# Patient Record
Sex: Female | Born: 1940 | Race: Black or African American | Hispanic: No | Smoking: Former smoker
Health system: Southern US, Community
[De-identification: ages and names within clinical notes are randomized; demographics above are authoritative.]

## PROBLEM LIST (undated history)

## (undated) DIAGNOSIS — Z8679 Personal history of other diseases of the circulatory system: Secondary | ICD-10-CM

## (undated) DIAGNOSIS — K7581 Nonalcoholic steatohepatitis (NASH): Secondary | ICD-10-CM

## (undated) DIAGNOSIS — F329 Major depressive disorder, single episode, unspecified: Secondary | ICD-10-CM

## (undated) DIAGNOSIS — M6281 Muscle weakness (generalized): Secondary | ICD-10-CM

## (undated) DIAGNOSIS — M3320 Polymyositis, organ involvement unspecified: Secondary | ICD-10-CM

## (undated) DIAGNOSIS — I10 Essential (primary) hypertension: Secondary | ICD-10-CM

## (undated) DIAGNOSIS — IMO0002 Reserved for concepts with insufficient information to code with codable children: Secondary | ICD-10-CM

## (undated) DIAGNOSIS — Z8719 Personal history of other diseases of the digestive system: Secondary | ICD-10-CM

## (undated) DIAGNOSIS — J302 Other seasonal allergic rhinitis: Secondary | ICD-10-CM

## (undated) DIAGNOSIS — Z9889 Other specified postprocedural states: Secondary | ICD-10-CM

## (undated) DIAGNOSIS — I872 Venous insufficiency (chronic) (peripheral): Secondary | ICD-10-CM

## (undated) DIAGNOSIS — R1314 Dysphagia, pharyngoesophageal phase: Secondary | ICD-10-CM

## (undated) DIAGNOSIS — F32A Depression, unspecified: Secondary | ICD-10-CM

## (undated) DIAGNOSIS — M549 Dorsalgia, unspecified: Secondary | ICD-10-CM

## (undated) DIAGNOSIS — K219 Gastro-esophageal reflux disease without esophagitis: Secondary | ICD-10-CM

## (undated) DIAGNOSIS — G629 Polyneuropathy, unspecified: Secondary | ICD-10-CM

## (undated) DIAGNOSIS — G43909 Migraine, unspecified, not intractable, without status migrainosus: Secondary | ICD-10-CM

## (undated) HISTORY — DX: Depression, unspecified: F32.A

## (undated) HISTORY — DX: Muscle weakness (generalized): M62.81

## (undated) HISTORY — DX: Major depressive disorder, single episode, unspecified: F32.9

## (undated) HISTORY — DX: Venous insufficiency (chronic) (peripheral): I87.2

## (undated) HISTORY — DX: Dorsalgia, unspecified: M54.9

## (undated) HISTORY — DX: Dysphagia, pharyngoesophageal phase: R13.14

## (undated) HISTORY — DX: Personal history of other diseases of the circulatory system: Z86.79

## (undated) HISTORY — PX: ESOPHAGOGASTRODUODENOSCOPY: SHX1529

## (undated) HISTORY — PX: KNEE ARTHROSCOPY: SUR90

## (undated) HISTORY — PX: CHOLECYSTECTOMY: SHX55

## (undated) HISTORY — DX: Migraine, unspecified, not intractable, without status migrainosus: G43.909

## (undated) HISTORY — DX: Polymyositis, organ involvement unspecified: M33.20

## (undated) HISTORY — DX: Personal history of other diseases of the digestive system: Z87.19

## (undated) HISTORY — DX: Other specified postprocedural states: Z98.890

## (undated) HISTORY — PX: BLADDER REPAIR: SHX76

## (undated) HISTORY — DX: Reserved for concepts with insufficient information to code with codable children: IMO0002

## (undated) HISTORY — PX: TUBAL LIGATION: SHX77

## (undated) HISTORY — DX: Gastro-esophageal reflux disease without esophagitis: K21.9

## (undated) HISTORY — DX: Essential (primary) hypertension: I10

## (undated) HISTORY — DX: Polyneuropathy, unspecified: G62.9

## (undated) HISTORY — DX: Other seasonal allergic rhinitis: J30.2

## (undated) HISTORY — PX: EYE SURGERY: SHX253

## (undated) HISTORY — DX: Nonalcoholic steatohepatitis (NASH): K75.81

---

## 1978-08-07 HISTORY — PX: ABDOMINAL HYSTERECTOMY: SHX81

## 1997-11-12 ENCOUNTER — Encounter: Admission: RE | Admit: 1997-11-12 | Discharge: 1997-11-12 | Payer: Self-pay | Admitting: Sports Medicine

## 1997-11-27 ENCOUNTER — Encounter: Admission: RE | Admit: 1997-11-27 | Discharge: 1997-11-27 | Payer: Self-pay | Admitting: Family Medicine

## 1997-12-09 ENCOUNTER — Encounter: Admission: RE | Admit: 1997-12-09 | Discharge: 1997-12-09 | Payer: Self-pay | Admitting: Family Medicine

## 1997-12-11 ENCOUNTER — Encounter: Admission: RE | Admit: 1997-12-11 | Discharge: 1997-12-11 | Payer: Self-pay | Admitting: Family Medicine

## 1998-01-04 ENCOUNTER — Encounter: Admission: RE | Admit: 1998-01-04 | Discharge: 1998-01-04 | Payer: Self-pay | Admitting: Family Medicine

## 1998-02-02 ENCOUNTER — Encounter: Admission: RE | Admit: 1998-02-02 | Discharge: 1998-02-02 | Payer: Self-pay | Admitting: Family Medicine

## 1998-02-09 ENCOUNTER — Encounter: Admission: RE | Admit: 1998-02-09 | Discharge: 1998-02-09 | Payer: Self-pay | Admitting: Family Medicine

## 1998-03-02 ENCOUNTER — Encounter: Admission: RE | Admit: 1998-03-02 | Discharge: 1998-03-02 | Payer: Self-pay | Admitting: Sports Medicine

## 1998-03-11 ENCOUNTER — Encounter: Admission: RE | Admit: 1998-03-11 | Discharge: 1998-03-11 | Payer: Self-pay | Admitting: Family Medicine

## 1998-03-16 ENCOUNTER — Encounter: Admission: RE | Admit: 1998-03-16 | Discharge: 1998-03-16 | Payer: Self-pay | Admitting: Family Medicine

## 1998-05-03 ENCOUNTER — Encounter: Admission: RE | Admit: 1998-05-03 | Discharge: 1998-05-03 | Payer: Self-pay | Admitting: Family Medicine

## 1998-05-04 ENCOUNTER — Encounter: Admission: RE | Admit: 1998-05-04 | Discharge: 1998-05-04 | Payer: Self-pay | Admitting: Sports Medicine

## 1998-05-10 ENCOUNTER — Encounter: Admission: RE | Admit: 1998-05-10 | Discharge: 1998-05-10 | Payer: Self-pay | Admitting: Family Medicine

## 1998-06-02 ENCOUNTER — Encounter: Admission: RE | Admit: 1998-06-02 | Discharge: 1998-06-02 | Payer: Self-pay | Admitting: Family Medicine

## 1998-06-24 ENCOUNTER — Encounter: Admission: RE | Admit: 1998-06-24 | Discharge: 1998-06-24 | Payer: Self-pay | Admitting: Family Medicine

## 1998-09-13 ENCOUNTER — Emergency Department (HOSPITAL_COMMUNITY): Admission: EM | Admit: 1998-09-13 | Discharge: 1998-09-13 | Payer: Self-pay | Admitting: Emergency Medicine

## 1998-09-22 ENCOUNTER — Encounter: Admission: RE | Admit: 1998-09-22 | Discharge: 1998-09-22 | Payer: Self-pay | Admitting: Family Medicine

## 1998-10-06 ENCOUNTER — Encounter: Admission: RE | Admit: 1998-10-06 | Discharge: 1998-10-06 | Payer: Self-pay | Admitting: Family Medicine

## 1998-11-09 ENCOUNTER — Encounter: Admission: RE | Admit: 1998-11-09 | Discharge: 1998-11-09 | Payer: Self-pay | Admitting: Family Medicine

## 1998-11-25 ENCOUNTER — Encounter: Admission: RE | Admit: 1998-11-25 | Discharge: 1998-11-25 | Payer: Self-pay | Admitting: Family Medicine

## 1999-01-12 ENCOUNTER — Encounter: Admission: RE | Admit: 1999-01-12 | Discharge: 1999-01-12 | Payer: Self-pay | Admitting: Family Medicine

## 1999-01-26 ENCOUNTER — Encounter: Admission: RE | Admit: 1999-01-26 | Discharge: 1999-01-26 | Payer: Self-pay | Admitting: Family Medicine

## 1999-02-01 ENCOUNTER — Encounter: Admission: RE | Admit: 1999-02-01 | Discharge: 1999-02-01 | Payer: Self-pay | Admitting: Sports Medicine

## 1999-02-11 ENCOUNTER — Emergency Department (HOSPITAL_COMMUNITY): Admission: EM | Admit: 1999-02-11 | Discharge: 1999-02-11 | Payer: Self-pay | Admitting: Emergency Medicine

## 1999-02-14 ENCOUNTER — Emergency Department (HOSPITAL_COMMUNITY): Admission: EM | Admit: 1999-02-14 | Discharge: 1999-02-14 | Payer: Self-pay | Admitting: *Deleted

## 1999-02-18 ENCOUNTER — Ambulatory Visit (HOSPITAL_COMMUNITY): Admission: RE | Admit: 1999-02-18 | Discharge: 1999-02-18 | Payer: Self-pay | Admitting: *Deleted

## 1999-02-24 ENCOUNTER — Encounter: Payer: Self-pay | Admitting: Orthopedic Surgery

## 1999-02-24 ENCOUNTER — Ambulatory Visit (HOSPITAL_COMMUNITY): Admission: RE | Admit: 1999-02-24 | Discharge: 1999-02-24 | Payer: Self-pay | Admitting: Orthopedic Surgery

## 1999-03-15 ENCOUNTER — Encounter: Admission: RE | Admit: 1999-03-15 | Discharge: 1999-03-15 | Payer: Self-pay | Admitting: Family Medicine

## 1999-04-14 ENCOUNTER — Encounter: Admission: RE | Admit: 1999-04-14 | Discharge: 1999-04-14 | Payer: Self-pay | Admitting: Family Medicine

## 1999-04-15 ENCOUNTER — Encounter: Admission: RE | Admit: 1999-04-15 | Discharge: 1999-05-04 | Payer: Self-pay | Admitting: Orthopedic Surgery

## 1999-04-25 ENCOUNTER — Emergency Department (HOSPITAL_COMMUNITY): Admission: EM | Admit: 1999-04-25 | Discharge: 1999-04-25 | Payer: Self-pay | Admitting: Emergency Medicine

## 1999-05-09 ENCOUNTER — Encounter: Admission: RE | Admit: 1999-05-09 | Discharge: 1999-07-11 | Payer: Self-pay | Admitting: Orthopedic Surgery

## 1999-06-09 ENCOUNTER — Encounter: Admission: RE | Admit: 1999-06-09 | Discharge: 1999-06-09 | Payer: Self-pay | Admitting: Family Medicine

## 1999-06-21 ENCOUNTER — Encounter: Admission: RE | Admit: 1999-06-21 | Discharge: 1999-06-21 | Payer: Self-pay | Admitting: Family Medicine

## 1999-07-19 ENCOUNTER — Encounter: Payer: Self-pay | Admitting: Sports Medicine

## 1999-07-19 ENCOUNTER — Encounter: Admission: RE | Admit: 1999-07-19 | Discharge: 1999-07-19 | Payer: Self-pay | Admitting: Sports Medicine

## 1999-08-12 ENCOUNTER — Encounter: Admission: RE | Admit: 1999-08-12 | Discharge: 1999-08-12 | Payer: Self-pay | Admitting: Family Medicine

## 1999-08-19 ENCOUNTER — Encounter: Admission: RE | Admit: 1999-08-19 | Discharge: 1999-08-19 | Payer: Self-pay | Admitting: Family Medicine

## 1999-09-16 ENCOUNTER — Encounter: Admission: RE | Admit: 1999-09-16 | Discharge: 1999-09-16 | Payer: Self-pay | Admitting: Family Medicine

## 1999-10-22 ENCOUNTER — Emergency Department (HOSPITAL_COMMUNITY): Admission: EM | Admit: 1999-10-22 | Discharge: 1999-10-22 | Payer: Self-pay | Admitting: Emergency Medicine

## 2000-08-24 ENCOUNTER — Encounter: Payer: Self-pay | Admitting: Emergency Medicine

## 2000-08-24 ENCOUNTER — Encounter: Admission: RE | Admit: 2000-08-24 | Discharge: 2000-08-24 | Payer: Self-pay | Admitting: Emergency Medicine

## 2000-10-01 ENCOUNTER — Ambulatory Visit (HOSPITAL_COMMUNITY): Admission: RE | Admit: 2000-10-01 | Discharge: 2000-10-01 | Payer: Self-pay | Admitting: Gastroenterology

## 2003-02-05 ENCOUNTER — Ambulatory Visit (HOSPITAL_COMMUNITY): Admission: RE | Admit: 2003-02-05 | Discharge: 2003-02-05 | Payer: Self-pay | Admitting: Family Medicine

## 2003-08-25 ENCOUNTER — Other Ambulatory Visit: Admission: RE | Admit: 2003-08-25 | Discharge: 2003-08-25 | Payer: Self-pay | Admitting: Obstetrics and Gynecology

## 2004-01-29 ENCOUNTER — Emergency Department (HOSPITAL_COMMUNITY): Admission: EM | Admit: 2004-01-29 | Discharge: 2004-01-29 | Payer: Self-pay | Admitting: Emergency Medicine

## 2004-03-03 ENCOUNTER — Ambulatory Visit (HOSPITAL_COMMUNITY): Admission: RE | Admit: 2004-03-03 | Discharge: 2004-03-03 | Payer: Self-pay | Admitting: Internal Medicine

## 2004-03-03 ENCOUNTER — Encounter: Admission: RE | Admit: 2004-03-03 | Discharge: 2004-03-03 | Payer: Self-pay | Admitting: Internal Medicine

## 2004-03-09 ENCOUNTER — Ambulatory Visit (HOSPITAL_COMMUNITY): Admission: RE | Admit: 2004-03-09 | Discharge: 2004-03-09 | Payer: Self-pay | Admitting: Family Medicine

## 2004-03-10 ENCOUNTER — Encounter: Admission: RE | Admit: 2004-03-10 | Discharge: 2004-03-10 | Payer: Self-pay | Admitting: Internal Medicine

## 2004-04-21 ENCOUNTER — Ambulatory Visit: Payer: Self-pay | Admitting: Internal Medicine

## 2004-05-05 ENCOUNTER — Ambulatory Visit: Payer: Self-pay | Admitting: Internal Medicine

## 2004-05-23 ENCOUNTER — Ambulatory Visit: Payer: Self-pay | Admitting: Internal Medicine

## 2004-07-20 ENCOUNTER — Ambulatory Visit: Payer: Self-pay | Admitting: Internal Medicine

## 2004-09-19 ENCOUNTER — Ambulatory Visit: Payer: Self-pay | Admitting: Internal Medicine

## 2004-09-22 ENCOUNTER — Encounter (HOSPITAL_BASED_OUTPATIENT_CLINIC_OR_DEPARTMENT_OTHER): Admission: RE | Admit: 2004-09-22 | Discharge: 2004-10-26 | Payer: Self-pay | Admitting: Internal Medicine

## 2004-09-23 ENCOUNTER — Emergency Department (HOSPITAL_COMMUNITY): Admission: EM | Admit: 2004-09-23 | Discharge: 2004-09-23 | Payer: Self-pay | Admitting: Family Medicine

## 2004-09-23 ENCOUNTER — Ambulatory Visit: Payer: Self-pay | Admitting: Family Medicine

## 2004-09-23 ENCOUNTER — Encounter (INDEPENDENT_AMBULATORY_CARE_PROVIDER_SITE_OTHER): Payer: Self-pay | Admitting: *Deleted

## 2004-09-28 ENCOUNTER — Ambulatory Visit: Payer: Self-pay | Admitting: Cardiology

## 2004-10-07 ENCOUNTER — Ambulatory Visit: Payer: Self-pay | Admitting: Family Medicine

## 2004-10-10 ENCOUNTER — Ambulatory Visit: Payer: Self-pay | Admitting: Internal Medicine

## 2004-10-31 ENCOUNTER — Ambulatory Visit: Payer: Self-pay | Admitting: Internal Medicine

## 2004-11-08 ENCOUNTER — Ambulatory Visit: Payer: Self-pay | Admitting: Cardiology

## 2004-11-14 ENCOUNTER — Ambulatory Visit: Payer: Self-pay | Admitting: Internal Medicine

## 2004-11-16 ENCOUNTER — Ambulatory Visit (HOSPITAL_COMMUNITY): Admission: RE | Admit: 2004-11-16 | Discharge: 2004-11-16 | Payer: Self-pay | Admitting: Internal Medicine

## 2004-11-29 ENCOUNTER — Ambulatory Visit: Payer: Self-pay | Admitting: Internal Medicine

## 2005-03-03 ENCOUNTER — Ambulatory Visit: Payer: Self-pay | Admitting: Family Medicine

## 2005-04-04 ENCOUNTER — Ambulatory Visit: Payer: Self-pay | Admitting: Internal Medicine

## 2005-04-14 ENCOUNTER — Ambulatory Visit (HOSPITAL_COMMUNITY): Admission: RE | Admit: 2005-04-14 | Discharge: 2005-04-14 | Payer: Self-pay | Admitting: Internal Medicine

## 2005-05-05 ENCOUNTER — Ambulatory Visit (HOSPITAL_COMMUNITY): Admission: RE | Admit: 2005-05-05 | Discharge: 2005-05-05 | Payer: Self-pay | Admitting: Gastroenterology

## 2005-08-13 ENCOUNTER — Emergency Department (HOSPITAL_COMMUNITY): Admission: EM | Admit: 2005-08-13 | Discharge: 2005-08-13 | Payer: Self-pay | Admitting: Emergency Medicine

## 2006-02-22 ENCOUNTER — Ambulatory Visit: Payer: Self-pay | Admitting: Internal Medicine

## 2006-05-07 ENCOUNTER — Ambulatory Visit: Payer: Self-pay | Admitting: Hospitalist

## 2006-06-04 ENCOUNTER — Ambulatory Visit: Payer: Self-pay | Admitting: Internal Medicine

## 2006-06-04 ENCOUNTER — Encounter (INDEPENDENT_AMBULATORY_CARE_PROVIDER_SITE_OTHER): Payer: Self-pay | Admitting: Internal Medicine

## 2006-06-04 LAB — CONVERTED CEMR LAB
ALT: 36 units/L (ref 0–40)
Alkaline Phosphatase: 67 units/L (ref 39–117)
Glucose, Bld: 99 mg/dL (ref 70–99)
Ketones, ur: NEGATIVE mg/dL
LDL Cholesterol: 67 mg/dL (ref 0–99)
MCHC: 32.7 g/dL (ref 30.0–36.0)
MCV: 96.8 fL (ref 78.0–100.0)
Nitrite: NEGATIVE
Platelets: 282 10*3/uL (ref 150–400)
RDW: 13.1 % (ref 11.5–14.0)
Sodium: 140 meq/L (ref 135–145)
Specific Gravity, Urine: 1.008 (ref 1.005–1.03)
Total Bilirubin: 0.4 mg/dL (ref 0.3–1.2)
Total Protein: 7.3 g/dL (ref 6.0–8.3)
Triglycerides: 221 mg/dL — ABNORMAL HIGH (ref <150)
Urobilinogen, UA: 0.2 (ref 0.0–1.0)
VLDL: 44 mg/dL — ABNORMAL HIGH (ref 0–40)

## 2006-06-20 ENCOUNTER — Encounter (INDEPENDENT_AMBULATORY_CARE_PROVIDER_SITE_OTHER): Payer: Self-pay | Admitting: Internal Medicine

## 2006-06-20 ENCOUNTER — Ambulatory Visit (HOSPITAL_COMMUNITY): Admission: RE | Admit: 2006-06-20 | Discharge: 2006-06-20 | Payer: Self-pay | Admitting: Internal Medicine

## 2006-07-11 ENCOUNTER — Encounter: Admission: RE | Admit: 2006-07-11 | Discharge: 2006-07-11 | Payer: Self-pay | Admitting: Internal Medicine

## 2006-08-08 ENCOUNTER — Emergency Department (HOSPITAL_COMMUNITY): Admission: EM | Admit: 2006-08-08 | Discharge: 2006-08-08 | Payer: Self-pay | Admitting: Family Medicine

## 2006-09-17 ENCOUNTER — Ambulatory Visit: Payer: Self-pay | Admitting: Internal Medicine

## 2006-09-28 ENCOUNTER — Encounter (INDEPENDENT_AMBULATORY_CARE_PROVIDER_SITE_OTHER): Payer: Self-pay | Admitting: Internal Medicine

## 2006-09-28 ENCOUNTER — Ambulatory Visit (HOSPITAL_COMMUNITY): Admission: RE | Admit: 2006-09-28 | Discharge: 2006-09-28 | Payer: Self-pay | Admitting: Internal Medicine

## 2006-09-28 HISTORY — PX: COLONOSCOPY: SHX174

## 2006-09-28 LAB — HM COLONOSCOPY: HM Colonoscopy: NEGATIVE

## 2006-10-03 ENCOUNTER — Ambulatory Visit: Payer: Self-pay | Admitting: Internal Medicine

## 2006-11-16 ENCOUNTER — Ambulatory Visit: Payer: Self-pay | Admitting: Internal Medicine

## 2006-11-16 DIAGNOSIS — H409 Unspecified glaucoma: Secondary | ICD-10-CM | POA: Insufficient documentation

## 2006-11-16 DIAGNOSIS — G43909 Migraine, unspecified, not intractable, without status migrainosus: Secondary | ICD-10-CM | POA: Insufficient documentation

## 2006-11-16 DIAGNOSIS — J301 Allergic rhinitis due to pollen: Secondary | ICD-10-CM

## 2006-11-16 DIAGNOSIS — K219 Gastro-esophageal reflux disease without esophagitis: Secondary | ICD-10-CM | POA: Insufficient documentation

## 2006-11-16 DIAGNOSIS — F325 Major depressive disorder, single episode, in full remission: Secondary | ICD-10-CM

## 2006-11-16 DIAGNOSIS — I10 Essential (primary) hypertension: Secondary | ICD-10-CM

## 2006-12-07 ENCOUNTER — Ambulatory Visit: Payer: Self-pay | Admitting: *Deleted

## 2006-12-07 ENCOUNTER — Encounter (INDEPENDENT_AMBULATORY_CARE_PROVIDER_SITE_OTHER): Payer: Self-pay | Admitting: Internal Medicine

## 2006-12-07 LAB — CONVERTED CEMR LAB
LDL Cholesterol: 80 mg/dL (ref 0–99)
Triglycerides: 164 mg/dL — ABNORMAL HIGH (ref <150)

## 2006-12-13 ENCOUNTER — Ambulatory Visit: Payer: Self-pay | Admitting: Internal Medicine

## 2006-12-13 DIAGNOSIS — N814 Uterovaginal prolapse, unspecified: Secondary | ICD-10-CM | POA: Insufficient documentation

## 2006-12-13 DIAGNOSIS — L84 Corns and callosities: Secondary | ICD-10-CM

## 2006-12-13 DIAGNOSIS — R928 Other abnormal and inconclusive findings on diagnostic imaging of breast: Secondary | ICD-10-CM | POA: Insufficient documentation

## 2006-12-14 ENCOUNTER — Telehealth: Payer: Self-pay | Admitting: *Deleted

## 2007-01-07 ENCOUNTER — Encounter: Admission: RE | Admit: 2007-01-07 | Discharge: 2007-01-07 | Payer: Self-pay | Admitting: Internal Medicine

## 2007-01-09 ENCOUNTER — Ambulatory Visit: Payer: Self-pay | Admitting: Obstetrics & Gynecology

## 2007-01-11 ENCOUNTER — Encounter (INDEPENDENT_AMBULATORY_CARE_PROVIDER_SITE_OTHER): Payer: Self-pay | Admitting: Dermatology

## 2007-01-11 ENCOUNTER — Ambulatory Visit: Payer: Self-pay | Admitting: Internal Medicine

## 2007-01-11 LAB — CONVERTED CEMR LAB
BUN: 16 mg/dL (ref 6–23)
Calcium: 9.4 mg/dL (ref 8.4–10.5)
Creatinine, Ser: 0.68 mg/dL (ref 0.40–1.20)
Glucose, Bld: 112 mg/dL — ABNORMAL HIGH (ref 70–99)

## 2007-02-06 ENCOUNTER — Ambulatory Visit: Payer: Self-pay | Admitting: Gynecology

## 2007-02-06 ENCOUNTER — Encounter (INDEPENDENT_AMBULATORY_CARE_PROVIDER_SITE_OTHER): Payer: Self-pay | Admitting: Gynecology

## 2007-02-06 DIAGNOSIS — R87612 Low grade squamous intraepithelial lesion on cytologic smear of cervix (LGSIL): Secondary | ICD-10-CM

## 2007-02-06 DIAGNOSIS — IMO0002 Reserved for concepts with insufficient information to code with codable children: Secondary | ICD-10-CM

## 2007-02-06 HISTORY — DX: Reserved for concepts with insufficient information to code with codable children: IMO0002

## 2007-02-06 HISTORY — DX: Low grade squamous intraepithelial lesion on cytologic smear of cervix (LGSIL): R87.612

## 2007-03-01 ENCOUNTER — Encounter (INDEPENDENT_AMBULATORY_CARE_PROVIDER_SITE_OTHER): Payer: Self-pay | Admitting: Internal Medicine

## 2007-03-07 ENCOUNTER — Ambulatory Visit: Payer: Self-pay | Admitting: Cardiology

## 2007-03-27 ENCOUNTER — Encounter: Payer: Self-pay | Admitting: Obstetrics & Gynecology

## 2007-03-27 ENCOUNTER — Ambulatory Visit: Payer: Self-pay | Admitting: Obstetrics & Gynecology

## 2007-04-02 ENCOUNTER — Ambulatory Visit: Payer: Self-pay | Admitting: Gynecology

## 2007-04-02 ENCOUNTER — Inpatient Hospital Stay (HOSPITAL_COMMUNITY): Admission: RE | Admit: 2007-04-02 | Discharge: 2007-04-05 | Payer: Self-pay | Admitting: Gynecology

## 2007-04-02 HISTORY — PX: LAPAROSCOPIC BURCH PROCEDURE: SHX1920

## 2007-04-12 ENCOUNTER — Ambulatory Visit: Payer: Self-pay | Admitting: Gynecology

## 2007-04-15 ENCOUNTER — Inpatient Hospital Stay (HOSPITAL_COMMUNITY): Admission: EM | Admit: 2007-04-15 | Discharge: 2007-04-19 | Payer: Self-pay | Admitting: Family Medicine

## 2007-04-15 ENCOUNTER — Ambulatory Visit: Payer: Self-pay | Admitting: Infectious Diseases

## 2007-04-15 ENCOUNTER — Ambulatory Visit: Payer: Self-pay | Admitting: Gynecology

## 2007-04-16 ENCOUNTER — Encounter: Payer: Self-pay | Admitting: Gynecology

## 2007-04-17 ENCOUNTER — Telehealth (INDEPENDENT_AMBULATORY_CARE_PROVIDER_SITE_OTHER): Payer: Self-pay | Admitting: Pharmacy Technician

## 2007-04-19 ENCOUNTER — Encounter (INDEPENDENT_AMBULATORY_CARE_PROVIDER_SITE_OTHER): Payer: Self-pay | Admitting: *Deleted

## 2007-04-29 ENCOUNTER — Telehealth: Payer: Self-pay | Admitting: *Deleted

## 2007-05-03 ENCOUNTER — Ambulatory Visit: Payer: Self-pay | Admitting: Gynecology

## 2007-05-08 ENCOUNTER — Telehealth: Payer: Self-pay | Admitting: *Deleted

## 2007-05-09 ENCOUNTER — Ambulatory Visit: Payer: Self-pay | Admitting: Internal Medicine

## 2007-05-09 ENCOUNTER — Encounter (INDEPENDENT_AMBULATORY_CARE_PROVIDER_SITE_OTHER): Payer: Self-pay | Admitting: Internal Medicine

## 2007-05-09 DIAGNOSIS — K5909 Other constipation: Secondary | ICD-10-CM | POA: Insufficient documentation

## 2007-05-09 DIAGNOSIS — R109 Unspecified abdominal pain: Secondary | ICD-10-CM | POA: Insufficient documentation

## 2007-05-10 LAB — CONVERTED CEMR LAB
ALT: 23 units/L (ref 0–35)
AST: 23 units/L (ref 0–37)
Alkaline Phosphatase: 77 units/L (ref 39–117)
Glucose, Bld: 112 mg/dL — ABNORMAL HIGH (ref 70–99)
Potassium: 4 meq/L (ref 3.5–5.3)
Sodium: 141 meq/L (ref 135–145)
Total Bilirubin: 0.2 mg/dL — ABNORMAL LOW (ref 0.3–1.2)
Total Protein: 6.9 g/dL (ref 6.0–8.3)

## 2007-09-02 ENCOUNTER — Telehealth (INDEPENDENT_AMBULATORY_CARE_PROVIDER_SITE_OTHER): Payer: Self-pay | Admitting: Internal Medicine

## 2007-10-09 ENCOUNTER — Telehealth (INDEPENDENT_AMBULATORY_CARE_PROVIDER_SITE_OTHER): Payer: Self-pay | Admitting: Internal Medicine

## 2007-10-30 ENCOUNTER — Encounter (INDEPENDENT_AMBULATORY_CARE_PROVIDER_SITE_OTHER): Payer: Self-pay | Admitting: Internal Medicine

## 2007-10-30 ENCOUNTER — Ambulatory Visit: Payer: Self-pay | Admitting: *Deleted

## 2007-10-30 LAB — CONVERTED CEMR LAB
BUN: 13 mg/dL (ref 6–23)
CO2: 27 meq/L (ref 19–32)
Glucose, Bld: 100 mg/dL — ABNORMAL HIGH (ref 70–99)
Sodium: 138 meq/L (ref 135–145)
Total Bilirubin: 0.3 mg/dL (ref 0.3–1.2)
Total Protein: 7.8 g/dL (ref 6.0–8.3)

## 2007-10-31 LAB — CONVERTED CEMR LAB
Candida species: POSITIVE — AB
Gardnerella vaginalis: NEGATIVE
Trichomonal Vaginitis: NEGATIVE

## 2007-11-04 ENCOUNTER — Emergency Department (HOSPITAL_COMMUNITY): Admission: EM | Admit: 2007-11-04 | Discharge: 2007-11-04 | Payer: Self-pay | Admitting: Emergency Medicine

## 2008-03-23 ENCOUNTER — Emergency Department (HOSPITAL_COMMUNITY): Admission: EM | Admit: 2008-03-23 | Discharge: 2008-03-23 | Payer: Self-pay | Admitting: Family Medicine

## 2008-04-03 ENCOUNTER — Ambulatory Visit: Payer: Self-pay | Admitting: Internal Medicine

## 2008-04-03 ENCOUNTER — Encounter (INDEPENDENT_AMBULATORY_CARE_PROVIDER_SITE_OTHER): Payer: Self-pay | Admitting: Internal Medicine

## 2008-04-03 ENCOUNTER — Telehealth: Payer: Self-pay | Admitting: *Deleted

## 2008-04-03 DIAGNOSIS — M109 Gout, unspecified: Secondary | ICD-10-CM

## 2008-04-27 ENCOUNTER — Telehealth: Payer: Self-pay | Admitting: *Deleted

## 2008-05-07 ENCOUNTER — Encounter: Admission: RE | Admit: 2008-05-07 | Discharge: 2008-05-07 | Payer: Self-pay | Admitting: Internal Medicine

## 2008-05-12 ENCOUNTER — Encounter (INDEPENDENT_AMBULATORY_CARE_PROVIDER_SITE_OTHER): Payer: Self-pay | Admitting: Internal Medicine

## 2008-05-19 ENCOUNTER — Ambulatory Visit: Payer: Self-pay | Admitting: Internal Medicine

## 2008-06-25 ENCOUNTER — Encounter (INDEPENDENT_AMBULATORY_CARE_PROVIDER_SITE_OTHER): Payer: Self-pay | Admitting: Internal Medicine

## 2008-06-25 ENCOUNTER — Ambulatory Visit: Payer: Self-pay | Admitting: Internal Medicine

## 2008-06-25 DIAGNOSIS — B353 Tinea pedis: Secondary | ICD-10-CM | POA: Insufficient documentation

## 2008-06-25 DIAGNOSIS — K222 Esophageal obstruction: Secondary | ICD-10-CM

## 2008-06-25 DIAGNOSIS — N952 Postmenopausal atrophic vaginitis: Secondary | ICD-10-CM

## 2008-06-25 DIAGNOSIS — M79609 Pain in unspecified limb: Secondary | ICD-10-CM

## 2008-06-25 LAB — CBC AND DIFFERENTIAL: WBC: 12.7 10^3/mL

## 2008-06-26 LAB — CONVERTED CEMR LAB
AST: 37 units/L (ref 0–37)
Albumin: 4 g/dL (ref 3.5–5.2)
Alkaline Phosphatase: 63 units/L (ref 39–117)
Glucose, Bld: 105 mg/dL — ABNORMAL HIGH (ref 70–99)
LDL Cholesterol: 91 mg/dL (ref 0–99)
MCHC: 33.5 g/dL (ref 30.0–36.0)
Potassium: 3.7 meq/L (ref 3.5–5.3)
RBC: 3.92 M/uL (ref 3.87–5.11)
RDW: 13.2 % (ref 11.5–15.5)
Sodium: 140 meq/L (ref 135–145)
Total Protein: 6.9 g/dL (ref 6.0–8.3)
Uric Acid, Serum: 7.8 mg/dL — ABNORMAL HIGH (ref 2.4–7.0)

## 2008-07-09 ENCOUNTER — Ambulatory Visit: Payer: Self-pay | Admitting: Internal Medicine

## 2008-07-09 DIAGNOSIS — K5900 Constipation, unspecified: Secondary | ICD-10-CM | POA: Insufficient documentation

## 2008-07-09 DIAGNOSIS — R1319 Other dysphagia: Secondary | ICD-10-CM

## 2008-07-16 ENCOUNTER — Ambulatory Visit (HOSPITAL_COMMUNITY): Admission: RE | Admit: 2008-07-16 | Discharge: 2008-07-16 | Payer: Self-pay | Admitting: Internal Medicine

## 2008-07-17 ENCOUNTER — Ambulatory Visit (HOSPITAL_COMMUNITY): Admission: RE | Admit: 2008-07-17 | Discharge: 2008-07-17 | Payer: Self-pay | Admitting: Internal Medicine

## 2008-07-17 ENCOUNTER — Ambulatory Visit: Payer: Self-pay | Admitting: Internal Medicine

## 2008-10-14 ENCOUNTER — Emergency Department (HOSPITAL_COMMUNITY): Admission: EM | Admit: 2008-10-14 | Discharge: 2008-10-14 | Payer: Self-pay | Admitting: Emergency Medicine

## 2008-10-26 ENCOUNTER — Ambulatory Visit: Payer: Self-pay | Admitting: Internal Medicine

## 2009-01-05 ENCOUNTER — Telehealth (INDEPENDENT_AMBULATORY_CARE_PROVIDER_SITE_OTHER): Payer: Self-pay | Admitting: Internal Medicine

## 2009-01-22 ENCOUNTER — Encounter: Payer: Self-pay | Admitting: Internal Medicine

## 2009-01-22 ENCOUNTER — Ambulatory Visit: Payer: Self-pay | Admitting: Internal Medicine

## 2009-01-22 LAB — LIPID PANEL
Cholesterol: 148 mg/dL (ref 0–200)
HDL: 46 mg/dL (ref 35–70)
Triglycerides: 160 mg/dL (ref 40–160)

## 2009-01-25 LAB — CONVERTED CEMR LAB
ALT: 60 units/L — ABNORMAL HIGH (ref 0–35)
AST: 54 units/L — ABNORMAL HIGH (ref 0–37)
Albumin: 4.2 g/dL (ref 3.5–5.2)
Calcium: 9.9 mg/dL (ref 8.4–10.5)
Chloride: 97 meq/L (ref 96–112)
LDL Cholesterol: 70 mg/dL (ref 0–99)
Potassium: 3.7 meq/L (ref 3.5–5.3)
Triglycerides: 160 mg/dL — ABNORMAL HIGH (ref <150)
VLDL: 32 mg/dL (ref 0–40)

## 2009-04-29 ENCOUNTER — Telehealth: Payer: Self-pay | Admitting: Internal Medicine

## 2009-05-01 ENCOUNTER — Emergency Department (HOSPITAL_COMMUNITY): Admission: EM | Admit: 2009-05-01 | Discharge: 2009-05-01 | Payer: Self-pay | Admitting: Emergency Medicine

## 2009-05-03 ENCOUNTER — Ambulatory Visit: Payer: Self-pay | Admitting: Internal Medicine

## 2009-05-03 DIAGNOSIS — J40 Bronchitis, not specified as acute or chronic: Secondary | ICD-10-CM

## 2009-05-03 DIAGNOSIS — R740 Nonspecific elevation of levels of transaminase and lactic acid dehydrogenase [LDH]: Secondary | ICD-10-CM

## 2009-05-06 LAB — CONVERTED CEMR LAB
ALT: 58 units/L — ABNORMAL HIGH (ref 0–35)
AST: 41 units/L — ABNORMAL HIGH (ref 0–37)
Calcium: 9.4 mg/dL (ref 8.4–10.5)
Chloride: 95 meq/L — ABNORMAL LOW (ref 96–112)
Creatinine, Ser: 0.66 mg/dL (ref 0.40–1.20)
Hep A Total Ab: POSITIVE — AB
Potassium: 3.2 meq/L — ABNORMAL LOW (ref 3.5–5.3)

## 2009-05-18 ENCOUNTER — Ambulatory Visit: Payer: Self-pay | Admitting: Internal Medicine

## 2009-05-19 ENCOUNTER — Ambulatory Visit (HOSPITAL_COMMUNITY): Admission: RE | Admit: 2009-05-19 | Discharge: 2009-05-19 | Payer: Self-pay | Admitting: Internal Medicine

## 2009-05-20 ENCOUNTER — Ambulatory Visit (HOSPITAL_COMMUNITY): Admission: RE | Admit: 2009-05-20 | Discharge: 2009-05-20 | Payer: Self-pay | Admitting: Internal Medicine

## 2009-06-23 ENCOUNTER — Telehealth: Payer: Self-pay | Admitting: Internal Medicine

## 2009-08-05 ENCOUNTER — Telehealth (INDEPENDENT_AMBULATORY_CARE_PROVIDER_SITE_OTHER): Payer: Self-pay | Admitting: *Deleted

## 2009-08-16 ENCOUNTER — Ambulatory Visit: Payer: Self-pay | Admitting: Internal Medicine

## 2009-08-16 DIAGNOSIS — L538 Other specified erythematous conditions: Secondary | ICD-10-CM | POA: Insufficient documentation

## 2009-08-31 ENCOUNTER — Encounter (INDEPENDENT_AMBULATORY_CARE_PROVIDER_SITE_OTHER): Payer: Self-pay | Admitting: Internal Medicine

## 2009-09-01 ENCOUNTER — Encounter (INDEPENDENT_AMBULATORY_CARE_PROVIDER_SITE_OTHER): Payer: Self-pay | Admitting: Internal Medicine

## 2009-09-26 ENCOUNTER — Emergency Department (HOSPITAL_COMMUNITY): Admission: EM | Admit: 2009-09-26 | Discharge: 2009-09-26 | Payer: Self-pay | Admitting: Family Medicine

## 2009-10-12 ENCOUNTER — Ambulatory Visit: Payer: Self-pay | Admitting: Internal Medicine

## 2009-10-12 DIAGNOSIS — R300 Dysuria: Secondary | ICD-10-CM | POA: Insufficient documentation

## 2009-10-12 LAB — CONVERTED CEMR LAB
Glucose, Urine, Semiquant: NEGATIVE
Ketones, urine, test strip: NEGATIVE
Nitrite: NEGATIVE
Urobilinogen, UA: 0.2
pH: 7.5

## 2009-12-02 ENCOUNTER — Telehealth: Payer: Self-pay | Admitting: Internal Medicine

## 2010-01-11 ENCOUNTER — Emergency Department (HOSPITAL_COMMUNITY): Admission: EM | Admit: 2010-01-11 | Discharge: 2010-01-11 | Payer: Self-pay | Admitting: Emergency Medicine

## 2010-01-11 ENCOUNTER — Telehealth: Payer: Self-pay | Admitting: Internal Medicine

## 2010-01-14 ENCOUNTER — Telehealth: Payer: Self-pay | Admitting: *Deleted

## 2010-01-14 ENCOUNTER — Ambulatory Visit: Payer: Self-pay | Admitting: Internal Medicine

## 2010-01-20 ENCOUNTER — Ambulatory Visit: Payer: Self-pay | Admitting: Internal Medicine

## 2010-02-03 ENCOUNTER — Telehealth: Payer: Self-pay | Admitting: Internal Medicine

## 2010-05-09 ENCOUNTER — Telehealth (INDEPENDENT_AMBULATORY_CARE_PROVIDER_SITE_OTHER): Payer: Self-pay | Admitting: *Deleted

## 2010-06-20 ENCOUNTER — Telehealth: Payer: Self-pay | Admitting: Internal Medicine

## 2010-07-12 ENCOUNTER — Ambulatory Visit: Payer: Self-pay | Admitting: Internal Medicine

## 2010-07-12 DIAGNOSIS — B350 Tinea barbae and tinea capitis: Secondary | ICD-10-CM

## 2010-07-12 DIAGNOSIS — M2390 Unspecified internal derangement of unspecified knee: Secondary | ICD-10-CM | POA: Insufficient documentation

## 2010-07-12 LAB — CONVERTED CEMR LAB
Glucose, Bld: 104 mg/dL — ABNORMAL HIGH (ref 70–99)
Potassium: 3.9 meq/L (ref 3.5–5.3)
Sodium: 142 meq/L (ref 135–145)

## 2010-07-12 LAB — BASIC METABOLIC PANEL: Creatinine: 0.5 mg/dL (ref <=1.1)

## 2010-07-26 ENCOUNTER — Encounter
Admission: RE | Admit: 2010-07-26 | Discharge: 2010-08-04 | Payer: Self-pay | Source: Home / Self Care | Attending: Internal Medicine | Admitting: Internal Medicine

## 2010-08-08 ENCOUNTER — Encounter
Admission: RE | Admit: 2010-08-08 | Discharge: 2010-09-06 | Payer: Self-pay | Source: Home / Self Care | Attending: Internal Medicine | Admitting: Internal Medicine

## 2010-08-09 ENCOUNTER — Encounter: Payer: Self-pay | Admitting: Internal Medicine

## 2010-08-15 ENCOUNTER — Encounter: Payer: Self-pay | Admitting: Internal Medicine

## 2010-08-16 ENCOUNTER — Encounter: Admit: 2010-08-16 | Payer: Self-pay | Admitting: Internal Medicine

## 2010-08-16 ENCOUNTER — Ambulatory Visit (HOSPITAL_COMMUNITY)
Admission: RE | Admit: 2010-08-16 | Discharge: 2010-08-16 | Payer: Self-pay | Source: Home / Self Care | Attending: Internal Medicine | Admitting: Internal Medicine

## 2010-08-24 ENCOUNTER — Encounter: Admit: 2010-08-24 | Payer: Self-pay | Admitting: Internal Medicine

## 2010-08-28 ENCOUNTER — Encounter: Payer: Self-pay | Admitting: Internal Medicine

## 2010-08-31 ENCOUNTER — Telehealth: Payer: Self-pay | Admitting: Internal Medicine

## 2010-09-01 DIAGNOSIS — H409 Unspecified glaucoma: Secondary | ICD-10-CM

## 2010-09-01 DIAGNOSIS — M109 Gout, unspecified: Secondary | ICD-10-CM | POA: Insufficient documentation

## 2010-09-01 DIAGNOSIS — IMO0002 Reserved for concepts with insufficient information to code with codable children: Secondary | ICD-10-CM

## 2010-09-01 DIAGNOSIS — J301 Allergic rhinitis due to pollen: Secondary | ICD-10-CM

## 2010-09-01 DIAGNOSIS — F329 Major depressive disorder, single episode, unspecified: Secondary | ICD-10-CM

## 2010-09-01 DIAGNOSIS — I10 Essential (primary) hypertension: Secondary | ICD-10-CM

## 2010-09-01 DIAGNOSIS — G43909 Migraine, unspecified, not intractable, without status migrainosus: Secondary | ICD-10-CM

## 2010-09-01 DIAGNOSIS — K222 Esophageal obstruction: Secondary | ICD-10-CM

## 2010-09-01 DIAGNOSIS — K219 Gastro-esophageal reflux disease without esophagitis: Secondary | ICD-10-CM

## 2010-09-06 NOTE — Progress Notes (Signed)
Summary: phone/gg  Phone Note Call from Patient   Caller: Patient Summary of Call: Pt called with c/o headache for a few days.  Constant.    She has taken 2 pain pills she had without relief.  Rates pain 6/10 Denies visual problems, no nausea. We are unable to see until Wednesday  Pt advised to go to [facility] for evaluation. Patient/caller verbalizes understanding of these instructions.  Initial call taken by: Merrie Roof RN,  May 09, 2010 12:00 PM  Follow-up for Phone Call        Agree. Follow-up by: Zoila Shutter MD,  May 09, 2010 2:53 PM

## 2010-09-06 NOTE — Assessment & Plan Note (Signed)
Summary: FALLING/SB.   Vital Signs:  Patient profile:   70 year old female Height:      70 inches Weight:      202.0 pounds BMI:     29.09 Temp:     98.2 degrees F oral Pulse rate:   78 / minute BP sitting:   163 / 90  (right arm)  Vitals Entered By: Filomena Jungling NT II (July 12, 2010 11:15 AM) CC: BILATERAL KNEES, BUCKLE UNDER BEEN FALLING/ CHECK SPOT ON BACK OF HEAD Is Patient Diabetic? No Nutritional Status BMI of > 30 = obese  Does patient need assistance? Functional Status Self care Ambulation Normal   Primary Care Provider:  Vassie Loll MD  CC:  BILATERAL KNEES and BUCKLE UNDER BEEN FALLING/ CHECK SPOT ON BACK OF HEAD.  History of Present Illness: Heather [name] is a 70 year old woman with pmh significant for HTN, GERD, depression, chronic migraine headaches who presents to the clinic for:  1) Falling - She says she keeps falling, and this has been going on for years, but lately has become more frequent. She states her knees "buckle up". She denies feeling dizzy before falling. She described her knees lock up and then cause up to lose balance. She says she has difficulty getting out the chair. She denies pain in her knees, but just says she lock up at times. She denies lightheadedness, and says it has no pattern.   2) Area in her scalp - Patient complains of an area at the back of her scalp that has been itchy. She states it started off as a small area, and that it has spread. It is itchy and tender. She notes her dog has this problem of losing their hair and has been prescribed a medication. She denies using this medication.   No other complaints or concerns this morning.     Depression History:      The patient denies a depressed mood most of the day and a diminished interest in her usual daily activities.         Preventive Screening-Counseling & Management  Alcohol-Tobacco     Smoking Status: quit     Packs/Day: 1.0     Year Quit: 40 years  ago  Caffeine-Diet-Exercise     Does Patient Exercise: no  Current Medications (verified): 1)  Fluoxetine Hcl 20 Mg Caps (Fluoxetine Hcl) .... Take 1 Tablet By Mouth Once A Day 2)  Toprol Xl 200 Mg  Tb24 (Metoprolol Succinate) .... Take 1 Tablet By Mouth Once A Day 3)  Amitriptyline Hcl 50 Mg Tabs (Amitriptyline Hcl) .... Take 1 Tablet At Bedtime 4)  Catapres 0.1 Mg Tabs (Clonidine Hcl) .... Take 1 Tablet By Mouth Two Times A Day 5)  Multivitamins  Caps (Multiple Vitamin) .... Take 1 Tablet By Mouth Once A Day 6)  Timoptic Ocudose 0.25 % Soln (Timolol Maleate) .... As Prescribed By Ophtalmologist 7)  Alphagan P 0.1 % Soln (Brimonidine Tartrate) .... As Prescribed By Ophtalmologist 8)  Omeprazole 40 Mg Cpdr (Omeprazole) .... Take One Tablet By Mouth Two Times A Day 9)  Norvasc 5 Mg Tabs (Amlodipine Besylate) .... Take 1 Tablet By Mouth Once A Day 10)  Zyrtec-D Allergy & Congestion 5-120 Mg Xr12h-Tab (Cetirizine-Pseudoephedrine) .... Take 1 Tablet By Mouth Two Times A Day For 1 Month 11)  Proair Hfa 108 (90 Base) Mcg/act Aers (Albuterol Sulfate) .... 2 Puffs Every 4 Hours As Needed For Shortness of Breath 12)  Colcrys 0.6 Mg  Tabs (Colchicine) .... Take 1 Pill By Mouth Two Times A Day As Needed For Pain From Gout. 13)  Allopurinol 100 Mg Tabs (Allopurinol) .... Take 1 Pill By Mouth Daily.  Allergies (verified): 1)  ! Darvon-N  Past History:  Past Medical History: Last updated: 04/03/2008 Allergic rhinitis Depression GERD Hypertension Migranes Glaucoma Gout like symptoms, treated at [facility] 08/09 Toe nail fungus vaginal pruritis, likely secondary to yeast infection 03/09, with recurrence 08/09 EGD and colonoscopy 09/28/06  Past Surgical History: Last updated: 07/09/2008 Cholecystectomy Hysterectomy Tubal Ligation Knee Arthroscopy  Family History: Last updated: 07/09/2008 Throat cancer: Brother Family History of Heart Disease: Mother, Father, sister  Social History: Last  updated: 07/09/2008 Married Patient is a former smoker.  Alcohol Use - no Illicit Drug Use - no  Risk Factors: Exercise: no (07/12/2010)  Risk Factors: Smoking Status: quit (07/12/2010) Packs/Day: 1.0 (07/12/2010)  Review of Systems      See HPI  Physical Exam  General:  alert and well-developed.   Neck:  supple.   Lungs:  normal respiratory effort and normal breath sounds.   Heart:  normal rate, regular rhythm, and no murmur.     Impression & Recommendations:  Problem # 1:  UNSPECIFIED INTERNAL DERANGEMENT OF KNEE (ICD-717.9)  Locking of the knee which is on intermittent basis. No obvious joint deformities. No associated pain, tenderness, warmth, inflammation, fevers or chills. Will refer to Physical therapy for strengthening.  Orders: Physical Therapy Referral (PT)  Problem # 2:  TINEA CAPITIS (ICD-110.0) Pt seen and examined also by Dr. Coralee Pesa.  Derm referral for further work up. Lesion on scalp is most consistent with tinea capitis. All treatment options are hepatotoxic, and given patient has hx of fatty liver, will refer pt to derm to make sure it is indeed tinea capitis, and then treat accordingly.   Orders: Dermatology Referral (Derma)  Problem # 3:  HYPERTENSION (ICD-401.9) Assessment: Deteriorated BP was manually rechecked and was 150/80. Will not make any changes to her regimen today as she usually has pretty well controlled blood pressure. Will continue to follow and if it remains elevated, would increase Norvasc to 10 mg by mouth once daily  Will check [facility] today.  Her updated medication list for this problem includes:    Toprol Xl 200 Mg Tb24 (Metoprolol succinate) .Marland Kitchen... Take 1 tablet by mouth once a day    Catapres 0.1 Mg Tabs (Clonidine hcl) .Marland Kitchen... Take 1 tablet by mouth two times a day    Norvasc 5 Mg Tabs (Amlodipine besylate) .Marland Kitchen... Take 1 tablet by mouth once a day  Orders: T-Basic Metabolic Panel [id number])  BP today: 163/90 Prior BP:  133/82 (01/20/2010)  Labs Reviewed: K+: 3.2 (05/03/2009) Creat: : 0.66 (05/03/2009)   Chol: 148 (01/22/2009)   HDL: 46 (01/22/2009)   LDL: 70 (01/22/2009)   TG: 160 (01/22/2009)  Problem # 4:  Preventive Health Care (ICD-V70.0) Mammogram scheduled.  Complete Medication List: 1)  Fluoxetine Hcl 20 Mg Caps (Fluoxetine hcl) .... Take 1 tablet by mouth once a day 2)  Toprol Xl 200 Mg Tb24 (Metoprolol succinate) .... Take 1 tablet by mouth once a day 3)  Amitriptyline Hcl 50 Mg Tabs (Amitriptyline hcl) .... Take 1 tablet at bedtime 4)  Catapres 0.1 Mg Tabs (Clonidine hcl) .... Take 1 tablet by mouth two times a day 5)  Multivitamins Caps (Multiple vitamin) .... Take 1 tablet by mouth once a day 6)  Timoptic Ocudose 0.25 % Soln (Timolol maleate) .... As prescribed by ophtalmologist  7)  Alphagan P 0.1 % Soln (Brimonidine tartrate) .... As prescribed by ophtalmologist 8)  Omeprazole 40 Mg Cpdr (Omeprazole) .... Take one tablet by mouth two times a day 9)  Norvasc 5 Mg Tabs (Amlodipine besylate) .... Take 1 tablet by mouth once a day 10)  Zyrtec-d Allergy & Congestion 5-120 Mg Xr12h-tab (Cetirizine-pseudoephedrine) .... Take 1 tablet by mouth two times a day for 1 month 11)  Proair Hfa 108 (90 Base) Mcg/act Aers (Albuterol sulfate) .... 2 puffs every 4 hours as needed for shortness of breath 12)  Colcrys 0.6 Mg Tabs (Colchicine) .... Take 1 pill by mouth two times a day as needed for pain from gout. 13)  Allopurinol 100 Mg Tabs (Allopurinol) .... Take 1 pill by mouth daily.  Other Orders: Mammogram (Screening) (Mammo)  Patient Instructions: 1)  Please schedule a follow-up appointment in 1 month. 2)  Please follow up with dermatology as scheduled.  3)  Please follow up with physical therapy.  Prescriptions: TERBINAFINE HCL 250 MG TABS (TERBINAFINE HCL) Take 1 tablet by mouth once a day for 4 weeks  #30 x 0   Entered and Authorized by:   Melida Quitter MD   Signed by:   Melida Quitter MD on  07/12/2010   Method used:   Electronically to        [facility]  [phone]* (retail)       [address]       [place], [place]  [address]       Ph: [phone] or [phone]       Fax: [phone]   RxID:   [id number]    Orders Added: 1)  Mammogram (Screening) [Mammo] 2)  T-Basic Metabolic Panel [[id number]] 3)  Dermatology Referral [Derma] 4)  Physical Therapy Referral [PT] 5)  Est. Patient Level III [[address]63]   Immunization History:  Influenza Immunization History:    Influenza:  Historical (05/09/2010)  Pneumovax Immunization History:    Pneumovax:  Pneumovax (10/12/2009)   Immunization History:  Influenza Immunization History:    Influenza:  Historical (05/09/2010) Process Orders Check Orders Results:     [facility]: Check successful Tests Sent for requisitioning (July 12, 2010 5:01 PM):     07/12/2010: [facility] -- T-Basic Metabolic Panel [id number] (signed)     Prevention & Chronic Care Immunizations   Influenza vaccine: Historical  (05/09/2010)    Tetanus booster: Not documented    Pneumococcal vaccine: Pneumovax  (10/12/2009)    H. zoster vaccine: Not documented  Colorectal Screening   Hemoccult: Not documented   Hemoccult action/deferral: Deferred  (07/12/2010)    Colonoscopy: Sigmoid diverticulosis  (09/28/2006)   Colonoscopy due: 09/28/2016  Other Screening   Pap smear: Not documented   Pap smear action/deferral: Not indicated S/P hysterectomy  (07/12/2010)    Mammogram: ASSESSMENT: Negative - BI-RADS 1^MM DIGITAL SCREENING  (05/19/2009)   Mammogram action/deferral: Ordered  (07/12/2010)    DXA bone density scan: Not documented   DXA bone density action/deferral: Deferred  (07/12/2010)   Smoking status: quit  (07/12/2010)  Lipids   Total Cholesterol: 148  (01/22/2009)   LDL: 70  (01/22/2009)   LDL Direct: Not documented   HDL: 46  (01/22/2009)    Triglycerides: 160  (01/22/2009)  Hypertension   Last Blood Pressure: 163 / 90  (07/12/2010)   Serum creatinine: 0.66  (05/03/2009)   BMP action: Ordered   Serum potassium 3.2  (05/03/2009)  Hypertension flowsheet reviewed?: Yes   Progress toward BP goal: Deteriorated  Self-Management Support :   Personal Goals (by the next clinic visit) :      Personal blood pressure goal: 140/90  (05/03/2009)   Hypertension self-management support: Written self-care plan, Education handout, Resources for patients handout  (01/14/2010)   Nursing Instructions: Schedule screening mammogram (see order)

## 2010-09-06 NOTE — Progress Notes (Signed)
Summary: refill/ hla  Phone Note Refill Request Message from:  Fax from Pharmacy on December 02, 2009 4:54 PM  Refills Requested: Medication #1:  PROAIR HFA 108 (90 BASE) MCG/ACT AERS 2 puffs every 4 hours as needed for shortness of breath Initial call taken by: Marin Roberts RN,  December 02, 2009 4:54 PM  Follow-up for Phone Call        Refill approved-nurse to complete    Prescriptions: PROAIR HFA 108 (90 BASE) MCG/ACT AERS (ALBUTEROL SULFATE) 2 puffs every 4 hours as needed for shortness of breath  #1 x 5   Entered and Authorized by:   Vassie Loll MD   Signed by:   Vassie Loll MD on 12/02/2009   Method used:   Electronically to        [facility]* (retail)       [address]       [place], [place]  [address]       Ph: [phone]       Fax: [phone]   RxID:   [id number]

## 2010-09-06 NOTE — Progress Notes (Signed)
Summary: refill/ hla  Phone Note Refill Request Message from:  Fax from Pharmacy on June 20, 2010 4:58 PM  Refills Requested: Medication #1:  OMEPRAZOLE 40 MG CPDR Take one tablet by mouth two times a day   Dosage confirmed as above?Dosage Confirmed  Medication #2:  FLUOXETINE HCL 20 MG CAPS Take 1 tablet by mouth once a day  Medication #3:  AMITRIPTYLINE HCL 50 MG TABS Take 1 tablet at bedtime  Medication #4:  CATAPRES 0.1 MG TABS Take 1 tablet by mouth two times a day last visit 6/16  Initial call taken by: Marin Roberts RN,  June 20, 2010 4:58 PM  Follow-up for Phone Call        Refill approved-nurse to complete    Prescriptions: OMEPRAZOLE 40 MG CPDR (OMEPRAZOLE) Take one tablet by mouth two times a day  #62 x 5   Entered and Authorized by:   Vassie Loll MD   Signed by:   Vassie Loll MD on 06/21/2010   Method used:   Electronically to        [facility]* (retail)       [address] [place], [place]  [address]       Ph: [phone]       Fax: [phone]   RxID:   [id number] CATAPRES 0.1 MG TABS (CLONIDINE HCL) Take 1 tablet by mouth two times a day  #62 x 3   Entered and Authorized by:   Vassie Loll MD   Signed by:   Vassie Loll MD on 06/21/2010   Method used:   Electronically to        [facility]* (retail)       [address] [place], [place]  [address]       Ph: [phone]       Fax: [phone]   RxID:   [id number] AMITRIPTYLINE HCL 50 MG TABS (AMITRIPTYLINE HCL) Take 1 tablet at bedtime  #90 x 3   Entered and Authorized by:   Vassie Loll MD   Signed by:   Vassie Loll MD on 06/21/2010   Method used:   Electronically to        [facility]* (retail)       [address] [place], [place]  [address]       Ph: [phone]       Fax: [phone]   RxID:   [id number] FLUOXETINE HCL 20 MG CAPS (FLUOXETINE HCL) Take 1 tablet by mouth once a day  #90 x 3   Entered and Authorized by:   Vassie Loll MD   Signed by:    Vassie Loll MD on 06/21/2010   Method used:   Electronically to        [facility]* (retail)       [address] [place], [place]  [address]       Ph: [phone]       Fax: [phone]   RxID:   [id number]

## 2010-09-06 NOTE — Assessment & Plan Note (Signed)
Summary: FU/SB.   Vital Signs:  Patient profile:   70 year old female Height:      70 inches (177.80 cm) Weight:      201.3 pounds (91.50 kg) BMI:     28.99 Temp:     97.9 degrees F (36.61 degrees C) oral Pulse rate:   74 / minute BP sitting:   133 / 82  (right arm)  Vitals Entered By: Stanton Kidney Ditzler RN (January 20, 2010 11:41 AM) Is Patient Diabetic? No Pain Assessment Patient in pain? no      Nutritional Status BMI of 25 - 29 = overweight Nutritional Status Detail appetite good  Have you ever been in a relationship where you felt threatened, hurt or afraid?denies   Does patient need assistance? Functional Status Self care Ambulation Normal Comments FU - better.   Primary Care Provider:  Vassie Loll MD   History of Present Illness: Ms. Delahoz comes for a f/u visit. Her left ankle is much better and she may hurt some when she walks but otherwise not. She has not started taking allopurinol yet.  Depression History:      The patient denies a depressed mood most of the day and a diminished interest in her usual daily activities.         Preventive Screening-Counseling & Management  Alcohol-Tobacco     Smoking Status: quit     Packs/Day: 1.0     Year Quit: 40 years ago  Caffeine-Diet-Exercise     Does Patient Exercise: no  Current Medications (verified): 1)  Fluoxetine Hcl 20 Mg Caps (Fluoxetine Hcl) .... Take 1 Tablet By Mouth Once A Day 2)  Toprol Xl 200 Mg  Tb24 (Metoprolol Succinate) .... Take 1 Tablet By Mouth Once A Day 3)  Amitriptyline Hcl 50 Mg Tabs (Amitriptyline Hcl) .... Take 1 Tablet At Bedtime 4)  Catapres 0.1 Mg Tabs (Clonidine Hcl) .... Take 1 Tablet By Mouth Two Times A Day 5)  Multivitamins  Caps (Multiple Vitamin) .... Take 1 Tablet By Mouth Once A Day 6)  Timoptic Ocudose 0.25 % Soln (Timolol Maleate) .... As Prescribed By Ophtalmologist 7)  Alphagan P 0.1 % Soln (Brimonidine Tartrate) .... As Prescribed By Ophtalmologist 8)  Omeprazole 40  Mg Cpdr (Omeprazole) .... Take One Tablet By Mouth Two Times A Day 9)  Norvasc 5 Mg Tabs (Amlodipine Besylate) .... Take 1 Tablet By Mouth Once A Day 10)  Zyrtec-D Allergy & Congestion 5-120 Mg Xr12h-Tab (Cetirizine-Pseudoephedrine) .... Take 1 Tablet By Mouth Two Times A Day For 1 Month 11)  Proair Hfa 108 (90 Base) Mcg/act Aers (Albuterol Sulfate) .... 2 Puffs Every 4 Hours As Needed For Shortness of Breath 12)  Colcrys 0.6 Mg Tabs (Colchicine) .... Take 1 Pill By Mouth Two Times A Day As Needed For Pain From Gout. 13)  Allopurinol 100 Mg Tabs (Allopurinol) .... Take 1 Pill By Mouth Daily.  Allergies: 1)  ! Darvon-N  Review of Systems      See HPI  Physical Exam  Mouth:  pharynx pink and moist.   Lungs:  normal breath sounds, no crackles, and no wheezes.   Heart:  normal rate, regular rhythm, no murmur, no gallop, and no rub.   Extremities:  trace left pedal edema.     Impression & Recommendations:  Problem # 1:  GOUT, UNSPECIFIED (ICD-274.9) Asked her to stop taking colchicine for now and start taking allopurinol. Can increase the dose of allopurinol if her attacks recurrs with this dose. She  is instructed to take colchicine as needed for acute pain.  Her updated medication list for this problem includes:    Colcrys 0.6 Mg Tabs (Colchicine) .Marland Kitchen... Take 1 pill by mouth two times a day as needed for pain from gout.    Allopurinol 100 Mg Tabs (Allopurinol) .Marland Kitchen... Take 1 pill by mouth daily.  Complete Medication List: 1)  Fluoxetine Hcl 20 Mg Caps (Fluoxetine hcl) .... Take 1 tablet by mouth once a day 2)  Toprol Xl 200 Mg Tb24 (Metoprolol succinate) .... Take 1 tablet by mouth once a day 3)  Amitriptyline Hcl 50 Mg Tabs (Amitriptyline hcl) .... Take 1 tablet at bedtime 4)  Catapres 0.1 Mg Tabs (Clonidine hcl) .... Take 1 tablet by mouth two times a day 5)  Multivitamins Caps (Multiple vitamin) .... Take 1 tablet by mouth once a day 6)  Timoptic Ocudose 0.25 % Soln (Timolol maleate)  .... As prescribed by ophtalmologist 7)  Alphagan P 0.1 % Soln (Brimonidine tartrate) .... As prescribed by ophtalmologist 8)  Omeprazole 40 Mg Cpdr (Omeprazole) .... Take one tablet by mouth two times a day 9)  Norvasc 5 Mg Tabs (Amlodipine besylate) .... Take 1 tablet by mouth once a day 10)  Zyrtec-d Allergy & Congestion 5-120 Mg Xr12h-tab (Cetirizine-pseudoephedrine) .... Take 1 tablet by mouth two times a day for 1 month 11)  Proair Hfa 108 (90 Base) Mcg/act Aers (Albuterol sulfate) .... 2 puffs every 4 hours as needed for shortness of breath 12)  Colcrys 0.6 Mg Tabs (Colchicine) .... Take 1 pill by mouth two times a day as needed for pain from gout. 13)  Allopurinol 100 Mg Tabs (Allopurinol) .... Take 1 pill by mouth daily.  Patient Instructions: 1)  Please schedule a follow-up appointment in 3 months. 2)  Limit your Sodium (Salt) to less than 2 grams a day(slightly less than 1/2 a teaspoon) to prevent fluid retention, swelling, or worsening of symptoms. 3)  It is important that you exercise regularly at least 20 minutes 5 times a week. If you develop chest pain, have severe difficulty breathing, or feel very tired , stop exercising immediately and seek medical attention. 4)  You need to lose weight. Consider a lower calorie diet and regular exercise.

## 2010-09-06 NOTE — Progress Notes (Signed)
Summary: phone/gg  Phone Note Call from Patient   Summary of Call: Pt called states seen at [facility] on Monday for pain in rt foot.  She was treated for gout colcrys 6 mg two times a day  but she has not had any relief. She was told to follow up  at [facility] if not better.  No improvement... will see today Initial call taken by: Merrie Roof RN,  January 14, 2010 12:08 PM

## 2010-09-06 NOTE — Assessment & Plan Note (Signed)
Summary: ACUTE-STOMACH PAIN-THISNK SHE HAS UTI/(MADERA)/CFB   Vital Signs:  Patient profile:   70 year old female Height:      70 inches Weight:      201.6 pounds BMI:     29.03 O2 Sat:      98 % on Room air Temp:     98.3 degrees F oral BP sitting:   119 / 73  (right arm)  Vitals Entered By: Filomena Jungling NT II (October 12, 2009 9:00 AM)  O2 Flow:  Room air CC: URGENT CARE FOLLOW-UP Is Patient Diabetic? No Pain Assessment Patient in pain? no      Nutritional Status BMI of 25 - 29 = overweight  Does patient need assistance? Functional Status Self care Ambulation Normal   Primary Care Provider:  Vassie Loll MD  CC:  URGENT CARE FOLLOW-UP.  History of Present Illness: Heather [name] is a 70 year old Female with PMH/problems as outlined in the EMR, who presents to the [facility] for urinary complaints. Hurts when she passes urine, this is going on for five days. No pain. fever none. Had simlar problem long time ago. She recently came to ER for cough and was treated with a course of z-pack.   Depression History:      The patient denies a depressed mood most of the day and a diminished interest in her usual daily activities.         Preventive Screening-Counseling & Management  Alcohol-Tobacco     Smoking Status: quit     Packs/Day: 1.0     Year Quit: 40 years ago  Caffeine-Diet-Exercise     Does Patient Exercise: no  Current Medications (verified): 1)  Fluoxetine Hcl 20 Mg Caps (Fluoxetine Hcl) .... Take 1 Tablet By Mouth Once A Day 2)  Toprol Xl 200 Mg  Tb24 (Metoprolol Succinate) .... Take 1 Tablet By Mouth Once A Day 3)  Amitriptyline Hcl 50 Mg Tabs (Amitriptyline Hcl) .... Take 1 Tablet At Bedtime 4)  Catapres 0.1 Mg Tabs (Clonidine Hcl) .... Take 1 Tablet By Mouth Two Times A Day 5)  Multivitamins  Caps (Multiple Vitamin) .... Take 1 Tablet By Mouth Once A Day 6)  Timoptic Ocudose 0.25 % Soln (Timolol Maleate) .... As Prescribed By Ophtalmologist 7)  Alphagan P  0.1 % Soln (Brimonidine Tartrate) .... As Prescribed By Ophtalmologist 8)  Omeprazole 40 Mg Cpdr (Omeprazole) .... Take One Tablet By Mouth Two Times A Day 9)  Norvasc 5 Mg Tabs (Amlodipine Besylate) .... Take 1 Tablet By Mouth Once A Day 10)  Colchicine 0.6 Mg Tabs (Colchicine) .... Take One Pill By Mouth Twice Daily For Gout Flare 11)  Zyrtec-D Allergy & Congestion 5-120 Mg Xr12h-Tab (Cetirizine-Pseudoephedrine) .... Take 1 Tablet By Mouth Two Times A Day For 1 Month 12)  Proair Hfa 108 (90 Base) Mcg/act Aers (Albuterol Sulfate) .... 2 Puffs Every 4 Hours As Needed For Shortness of Breath 13)  Nystatin 100000 Unit/gm Crea (Nystatin) .... Use On Skin Rash Two Times A Day For One Week.  Allergies (verified): 1)  ! Darvon-N  Past History:  Past Medical History: Last updated: 04/03/2008 Allergic rhinitis Depression GERD Hypertension Migranes Glaucoma Gout like symptoms, treated [facility] 08/09 Toe nail fungus vaginal pruritis, likely secondary to yeast infection 03/09, with recurrence 08/09 EGD and colonoscopy 09/28/06  Past Surgical History: Last updated: 07/09/2008 Cholecystectomy Hysterectomy Tubal Ligation Knee Arthroscopy  Family History: Last updated: 07/09/2008 Throat cancer: Brother Family History of Heart Disease: Mother, Father, sister  Social History: Last updated: 07/09/2008 Married Patient is a former smoker.  Alcohol Use - no Illicit Drug Use - no  Risk Factors: Exercise: no (10/12/2009)  Risk Factors: Smoking Status: quit (10/12/2009) Packs/Day: 1.0 (10/12/2009)  Review of Systems       as per HPI  Physical Exam  General:  alert and well-developed.   Lungs:  normal respiratory effort and normal breath sounds.   Heart:  normal rate and regular rhythm.   Abdomen:  soft, non-tender, normal bowel sounds, no distention, no masses, no guarding, and no rigidity.   Pulses:  normal peripheral pulses  Extremities:  no cyanosis, clubbing or edema    Neurologic:  non focal. Psych:  normally interactive.     Impression & Recommendations:  Problem # 1:  DYSURIA (ICD-788.1) UA stat done, c/w uti, will treat with a course of abx for three days.   Her updated medication list for this problem includes:    Cipro 500 Mg Tabs (Ciprofloxacin hcl) .Marland Kitchen... Take 1 tablet by mouth two times a day for three day  Problem # 2:  HYPERTENSION (ICD-401.9) Well controlled on current regimen. Continue.   Her updated medication list for this problem includes:    Toprol Xl 200 Mg Tb24 (Metoprolol succinate) .Marland Kitchen... Take 1 tablet by mouth once a day    Catapres 0.1 Mg Tabs (Clonidine hcl) .Marland Kitchen... Take 1 tablet by mouth two times a day    Norvasc 5 Mg Tabs (Amlodipine besylate) .Marland Kitchen... Take 1 tablet by mouth once a day  Problem # 3:  Preventive Health Care (ICD-V70.0) Pneumovax done today.   Complete Medication List: 1)  Fluoxetine Hcl 20 Mg Caps (Fluoxetine hcl) .... Take 1 tablet by mouth once a day 2)  Toprol Xl 200 Mg Tb24 (Metoprolol succinate) .... Take 1 tablet by mouth once a day 3)  Amitriptyline Hcl 50 Mg Tabs (Amitriptyline hcl) .... Take 1 tablet at bedtime 4)  Catapres 0.1 Mg Tabs (Clonidine hcl) .... Take 1 tablet by mouth two times a day 5)  Multivitamins Caps (Multiple vitamin) .... Take 1 tablet by mouth once a day 6)  Timoptic Ocudose 0.25 % Soln (Timolol maleate) .... As prescribed by ophtalmologist 7)  Alphagan P 0.1 % Soln (Brimonidine tartrate) .... As prescribed by ophtalmologist 8)  Omeprazole 40 Mg Cpdr (Omeprazole) .... Take one tablet by mouth two times a day 9)  Norvasc 5 Mg Tabs (Amlodipine besylate) .... Take 1 tablet by mouth once a day 10)  Colchicine 0.6 Mg Tabs (Colchicine) .... Take one pill by mouth twice daily for gout flare 11)  Zyrtec-d Allergy & Congestion 5-120 Mg Xr12h-tab (Cetirizine-pseudoephedrine) .... Take 1 tablet by mouth two times a day for 1 month 12)  Proair Hfa 108 (90 Base) Mcg/act Aers (Albuterol sulfate)  .... 2 puffs every 4 hours as needed for shortness of breath 13)  Nystatin 100000 Unit/gm Crea (Nystatin) .... Use on skin rash two times a day for one week. 14)  Cipro 500 Mg Tabs (Ciprofloxacin hcl) .... Take 1 tablet by mouth two times a day for three day  Other Orders: Pneumococcal Vaccine (16109) Admin 1st Vaccine (60454)  Patient Instructions: 1)  Do let us know if your problem worsens.  2)  Please schedule a follow-up appointment in 3 months. Prescriptions: CIPRO 500 MG TABS (CIPROFLOXACIN HCL) Take 1 tablet by mouth two times a day for three day  #6 x 0   Entered and Authorized by:   Zara Council MD  Signed by:   Zara Council MD on 10/12/2009   Method used:   Electronically to        [facility]  [phone]* (retail)       [address]       [place], [place]  [address]       Ph: [phone] or [phone]       Fax: [phone]   RxID:   [id number]   Prevention & Chronic Care Immunizations   Influenza vaccine: Fluvax MCR  (05/03/2009)    Tetanus booster: Not documented    Pneumococcal vaccine: Pneumovax  (10/12/2009)    H. zoster vaccine: Not documented  Colorectal Screening   Hemoccult: Not documented    Colonoscopy: Sigmoid diverticulosis  (09/28/2006)   Colonoscopy due: 09/28/2016  Other Screening   Pap smear: Not documented   Pap smear action/deferral: Deferred  (05/03/2009)    Mammogram: ASSESSMENT: Negative - BI-RADS 1^MM DIGITAL SCREENING  (05/19/2009)   Mammogram action/deferral: Deferred  (05/03/2009)    DXA bone density scan: Not documented   Smoking status: quit  (10/12/2009)  Lipids   Total Cholesterol: 148  (01/22/2009)   LDL: 70  (01/22/2009)   LDL Direct: Not documented   HDL: 46  (01/22/2009)   Triglycerides: 160  (01/22/2009)  Hypertension   Last Blood Pressure: 119 / 73  (10/12/2009)   Serum creatinine: 0.66  (05/03/2009)   Serum potassium 3.2  (05/03/2009)    Hypertension flowsheet  reviewed?: Yes   Progress toward BP goal: At goal  Self-Management Support :   Personal Goals (by the next clinic visit) :      Personal blood pressure goal: 140/90  (05/03/2009)   Patient will work on the following items until the next clinic visit to reach self-care goals:     Medications and monitoring: take my medicines every day  (10/12/2009)     Eating: drink diet soda or water instead of juice or soda, eat more vegetables, use fresh or frozen vegetables, eat foods that are low in salt, eat baked foods instead of fried foods, eat fruit for snacks and desserts, limit or avoid alcohol  (10/12/2009)    Hypertension self-management support: Education handout, Resources for patients handout  (10/12/2009)   Hypertension education handout printed      Resource handout printed.   Nursing Instructions: Give Pneumovax today    Laboratory Results   Urine Tests  Date/Time Received: 10-12-09  Routine Urinalysis   Color: yellow Appearance: Cloudy Glucose: negative   (Normal Range: Negative) Bilirubin: negative   (Normal Range: Negative) Ketone: negative   (Normal Range: Negative) Spec. Gravity: 1.010   (Normal Range: 1.003-1.035) Blood: trace-intact   (Normal Range: Negative) pH: 7.5   (Normal Range: 5.0-8.0) Protein: negative   (Normal Range: Negative) Urobilinogen: 0.2   (Normal Range: 0-1) Nitrite: negative   (Normal Range: Negative) Leukocyte Esterace: large   (Normal Range: Negative)        Immunizations Administered:  Pneumonia Vaccine:    Vaccine Type: Pneumovax    Site: left deltoid    Mfr: Merck    Dose: 0.5 ml    Route: IM    Given by: Angelina Ok RN    Exp. Date: 03/21/2011    Lot #: [id number]    VIS given: 03/04/96 version given October 12, 2009.

## 2010-09-06 NOTE — Assessment & Plan Note (Signed)
Summary: acute-rash-(madera)/cfb   Vital Signs:  Patient profile:   70 year old female Height:      70 inches (177.80 cm) Weight:      206.6 pounds (93.91 kg) BMI:     29.75 Temp:     97.9 degrees F (36.61 degrees C) oral Pulse rate:   77 / minute BP sitting:   147 / 86  (right arm)  Vitals Entered By: Stanton Kidney Ditzler RN (August 16, 2009 9:33 AM) Is Patient Diabetic? No Pain Assessment Patient in pain? yes     Location: h/a Intensity: 6 Onset of pain  since 08/15/09 ? sinus Nutritional Status BMI of 25 - 29 = overweight Nutritional Status Detail appetite good  Have you ever been in a relationship where you felt threatened, hurt or afraid?denies   Does patient need assistance? Functional Status Self care Ambulation Normal Comments Past 5 weeks rah in both groin area - no itching. Always changing soap and fabric softners. Discuss sinus meds.   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  She is here today to discuss  1) rash in her groin for 5 weeks: sometimes burns, but usually not painful.  No itching or drainage. No dysuria or vaginal discharge.  2) sinus medciations. does not remember what she is on and needs refill.  Depression History:      The patient denies a depressed mood most of the day and a diminished interest in her usual daily activities.         Preventive Screening-Counseling & Management  Alcohol-Tobacco     Smoking Status: quit     Packs/Day: 1.0     Year Quit: 40 years ago  Caffeine-Diet-Exercise     Does Patient Exercise: no  Current Medications (verified): 1)  Fluoxetine Hcl 20 Mg Caps (Fluoxetine Hcl) .... Take 1 Tablet By Mouth Once A Day 2)  Toprol Xl 200 Mg  Tb24 (Metoprolol Succinate) .... Take 1 Tablet By Mouth Once A Day 3)  Amitriptyline Hcl 50 Mg Tabs (Amitriptyline Hcl) .... Take 1 Tablet At  Bedtime 4)  Catapres 0.1 Mg Tabs (Clonidine Hcl) .... Take 1 Tablet By Mouth Two Times A Day 5)  Multivitamins  Caps (Multiple Vitamin) .... Take 1 Tablet By Mouth Once A Day 6)  Timoptic Ocudose 0.25 % Soln (Timolol Maleate) .... As Prescribed By Ophtalmologist 7)  Alphagan P 0.1 % Soln (Brimonidine Tartrate) .... As Prescribed By Ophtalmologist 8)  Omeprazole 40 Mg Cpdr (Omeprazole) .... Take One Tablet By Mouth Two Times A Day 9)  Norvasc 5 Mg Tabs (Amlodipine Besylate) .... Take 1 Tablet By Mouth Once A Day 10)  Colchicine 0.6 Mg Tabs (Colchicine) .... Take One Pill By Mouth Twice Daily 11)  Zyrtec-D Allergy & Congestion 5-120 Mg Xr12h-Tab (Cetirizine-Pseudoephedrine) .... Take 1 Tablet By Mouth Two Times A Day For 1 Month 12)  Proair Hfa 108 (90 Base) Mcg/act Aers (Albuterol Sulfate) .... 2 Puffs Every 4 Hours As Needed For Shortness of Breath  Allergies: 1)  ! Darvon-N (Propoxyphene Napsylate)  Review of Systems       per hpi  Physical Exam  General:  alert and well-developed.   Head:  normocephalic and atraumatic.   Lungs:  normal respiratory effort and normal breath sounds.   Heart:  normal rate, regular rhythm, and no murmur.  Pulses:  2+ Extremities:  no edema Skin:  ther is slight erythema in the inguinal folds, skin seems shiney and mildly irratated.  No induration, skin breaks or warmth. Inguinal Nodes:  no R inguinal adenopathy and no L inguinal adenopathy.   Psych:  Oriented X3, memory intact for recent and remote, normally interactive, and good eye contact.     Impression & Recommendations:  Problem # 1:  INTERTRIGO (ZOX-096.04) Will treat with nystatin cream two times a day for one week.  Problem # 2:  HYPERTENSION (ICD-401.9) BP higher today than previous.  She is not sure what medications she is on.  Will refill meds and have her come back for recheck after taking them consistently.  The following medications were removed from the medication list:     Maxzide-25 37.5-25 Mg Tabs (Triamterene-hctz) ..... On hold Her updated medication list for this problem includes:    Toprol Xl 200 Mg Tb24 (Metoprolol succinate) .Marland Kitchen... Take 1 tablet by mouth once a day    Catapres 0.1 Mg Tabs (Clonidine hcl) .Marland Kitchen... Take 1 tablet by mouth two times a day    Norvasc 5 Mg Tabs (Amlodipine besylate) .Marland Kitchen... Take 1 tablet by mouth once a day  BP today: 147/86 Prior BP: 133/78 (05/18/2009)  Labs Reviewed: K+: 3.2 (05/03/2009) Creat: : 0.66 (05/03/2009)   Chol: 148 (01/22/2009)   HDL: 46 (01/22/2009)   LDL: 70 (01/22/2009)   TG: 160 (01/22/2009)  Complete Medication List: 1)  Fluoxetine Hcl 20 Mg Caps (Fluoxetine hcl) .... Take 1 tablet by mouth once a day 2)  Toprol Xl 200 Mg Tb24 (Metoprolol succinate) .... Take 1 tablet by mouth once a day 3)  Amitriptyline Hcl 50 Mg Tabs (Amitriptyline hcl) .... Take 1 tablet at bedtime 4)  Catapres 0.1 Mg Tabs (Clonidine hcl) .... Take 1 tablet by mouth two times a day 5)  Multivitamins Caps (Multiple vitamin) .... Take 1 tablet by mouth once a day 6)  Timoptic Ocudose 0.25 % Soln (Timolol maleate) .... As prescribed by ophtalmologist 7)  Alphagan P 0.1 % Soln (Brimonidine tartrate) .... As prescribed by ophtalmologist 8)  Omeprazole 40 Mg Cpdr (Omeprazole) .... Take one tablet by mouth two times a day 9)  Norvasc 5 Mg Tabs (Amlodipine besylate) .... Take 1 tablet by mouth once a day 10)  Colchicine 0.6 Mg Tabs (Colchicine) .... Take one pill by mouth twice daily for gout flare 11)  Zyrtec-d Allergy & Congestion 5-120 Mg Xr12h-tab (Cetirizine-pseudoephedrine) .... Take 1 tablet by mouth two times a day for 1 month 12)  Proair Hfa 108 (90 Base) Mcg/act Aers (Albuterol sulfate) .... 2 puffs every 4 hours as needed for shortness of breath 13)  Nystatin 100000 Unit/gm Crea (Nystatin) .... Use on skin rash two times a day for one week.  Patient Instructions: 1)  Please schedule a follow-up appointment in 1 month. 2)  Your  refills will be sent to [facility]. 3)  You have a new prescription for a cream to use on your rash two times a day. Prescriptions: COLCHICINE 0.6 MG TABS (COLCHICINE) Take one pill by mouth twice daily for gout flare  #20 x 3   Entered and Authorized by:   Elby Showers MD   Signed by:   Elby Showers MD on 08/16/2009   Method used:   Print then Give to Patient   RxID:   [id number] PROAIR HFA 108 (90 BASE) MCG/ACT AERS (ALBUTEROL SULFATE) 2 puffs every 4 hours as needed for shortness  of breath  #1 x 2   Entered and Authorized by:   Elby Showers MD   Signed by:   Elby Showers MD on 08/16/2009   Method used:   Print then Give to Patient   RxID:   [id number] NORVASC 5 MG TABS (AMLODIPINE BESYLATE) Take 1 tablet by mouth once a day  #90 x 5   Entered and Authorized by:   Elby Showers MD   Signed by:   Elby Showers MD on 08/16/2009   Method used:   Print then Give to Patient   RxID:   [id number] OMEPRAZOLE 40 MG CPDR (OMEPRAZOLE) Take one tablet by mouth two times a day  #62 x 5   Entered and Authorized by:   Elby Showers MD   Signed by:   Elby Showers MD on 08/16/2009   Method used:   Print then Give to Patient   RxID:   [id number] CATAPRES 0.1 MG TABS (CLONIDINE HCL) Take 1 tablet by mouth two times a day  #62 x 6   Entered and Authorized by:   Elby Showers MD   Signed by:   Elby Showers MD on 08/16/2009   Method used:   Print then Give to Patient   RxID:   [id number] AMITRIPTYLINE HCL 50 MG TABS (AMITRIPTYLINE HCL) Take 1 tablet at bedtime  #90 x 3   Entered and Authorized by:   Elby Showers MD   Signed by:   Elby Showers MD on 08/16/2009   Method used:   Print then Give to Patient   RxID:   [id number] TOPROL XL 200 MG  TB24 (METOPROLOL SUCCINATE) Take 1 tablet by mouth once a day  #90 x 5   Entered and Authorized by:   Elby Showers MD   Signed by:   Elby Showers MD on 08/16/2009   Method used:   Print  then Give to Patient   RxID:   [id number] FLUOXETINE HCL 20 MG CAPS (FLUOXETINE HCL) Take 1 tablet by mouth once a day  #90 x 3   Entered and Authorized by:   Elby Showers MD   Signed by:   Elby Showers MD on 08/16/2009   Method used:   Print then Give to Patient   RxID:   [id number] ZYRTEC-D ALLERGY & CONGESTION 5-120 MG XR12H-TAB (CETIRIZINE-PSEUDOEPHEDRINE) Take 1 tablet by mouth two times a day for 1 month  #62 x 1   Entered and Authorized by:   Elby Showers MD   Signed by:   Elby Showers MD on 08/16/2009   Method used:   Print then Give to Patient   RxID:   [id number] NYSTATIN 100000 UNIT/GM CREA (NYSTATIN) Use on skin rash two times a day for one week.  #15g x 0   Entered and Authorized by:   Elby Showers MD   Signed by:   Elby Showers MD on 08/16/2009   Method used:   Print then Give to Patient   RxID:   [id number]    Prevention & Chronic Care Immunizations   Influenza vaccine: Fluvax MCR  (05/03/2009)    Tetanus booster: Not documented    Pneumococcal vaccine: Not documented    H. zoster vaccine: Not documented  Colorectal Screening   Hemoccult: Not documented    Colonoscopy: Sigmoid diverticulosis  (09/28/2006)   Colonoscopy due: 09/28/2016  Other Screening   Pap smear: Not documented   Pap smear action/deferral: Deferred  (05/03/2009)    Mammogram: ASSESSMENT: Negative - BI-RADS 1^MM DIGITAL SCREENING  (  05/19/2009)   Mammogram action/deferral: Deferred  (05/03/2009)    DXA bone density scan: Not documented   Smoking status: quit  (08/16/2009)  Lipids   Total Cholesterol: 148  (01/22/2009)   LDL: 70  (01/22/2009)   LDL Direct: Not documented   HDL: 46  (01/22/2009)   Triglycerides: 160  (01/22/2009)  Hypertension   Last Blood Pressure: 147 / 86  (08/16/2009)   Serum creatinine: 0.66  (05/03/2009)   Serum potassium 3.2  (05/03/2009)    Hypertension flowsheet reviewed?: Yes   Progress toward BP goal:  Deteriorated  Self-Management Support :   Personal Goals (by the next clinic visit) :      Personal blood pressure goal: 140/90  (05/03/2009)   Hypertension self-management support: Written self-care plan  (05/03/2009)

## 2010-09-06 NOTE — Assessment & Plan Note (Signed)
Summary: UCC f/u/gg   Vital Signs:  Patient profile:   70 year old female Height:      70 inches (177.80 cm) Weight:      202.3 pounds (91.95 kg) BMI:     29.13 Temp:     98.1 degrees F (36.72 degrees C) oral Pulse rate:   77 / minute BP sitting:   115 / 64  (right arm)  Vitals Entered By: Stanton Kidney Ditzler RN (January 14, 2010 2:01 PM) Is Patient Diabetic? No Pain Assessment Patient in pain? yes     Location: right foot Intensity: 5-10 Type: painful Onset of pain  past 5 days Nutritional Status BMI of 25 - 29 = overweight Nutritional Status Detail appetite good  Have you ever been in a relationship where you felt threatened, hurt or afraid?denies   Does patient need assistance? Functional Status Self care Ambulation Normal Comments FU Urgent Care - very sl better.  - gout right foot.   Primary Care Provider:  Vassie Loll MD   History of Present Illness: Ms. Heather [name] comes today for ED f/u visit. SHe went there for r. ankle pain which was presumed to be from gout. Initially it was on the back of the right ankle at the area of achilles tendon when she went to ED. She was given prescription of cochicine and it helped the pain. Later on she started to hurt the right base of right great toe. It is red, tender, swollen and hurt when she walks. SHe had a similar episode twice at the exact same spot before within a year and colchine helped. She had never had a joint aspiration. She denies fever/chills.   Depression History:      The patient denies a depressed mood most of the day and a diminished interest in her usual daily activities.         Preventive Screening-Counseling & Management  Alcohol-Tobacco     Smoking Status: quit     Packs/Day: 1.0     Year Quit: 40 years ago  Caffeine-Diet-Exercise     Does Patient Exercise: no  Current Medications (verified): 1)  Fluoxetine Hcl 20 Mg Caps (Fluoxetine Hcl) .... Take 1 Tablet By Mouth Once A Day 2)  Toprol Xl 200 Mg  Tb24  (Metoprolol Succinate) .... Take 1 Tablet By Mouth Once A Day 3)  Amitriptyline Hcl 50 Mg Tabs (Amitriptyline Hcl) .... Take 1 Tablet At Bedtime 4)  Catapres 0.1 Mg Tabs (Clonidine Hcl) .... Take 1 Tablet By Mouth Two Times A Day 5)  Multivitamins  Caps (Multiple Vitamin) .... Take 1 Tablet By Mouth Once A Day 6)  Timoptic Ocudose 0.25 % Soln (Timolol Maleate) .... As Prescribed By Ophtalmologist 7)  Alphagan P 0.1 % Soln (Brimonidine Tartrate) .... As Prescribed By Ophtalmologist 8)  Omeprazole 40 Mg Cpdr (Omeprazole) .... Take One Tablet By Mouth Two Times A Day 9)  Norvasc 5 Mg Tabs (Amlodipine Besylate) .... Take 1 Tablet By Mouth Once A Day 10)  Colcrys 0.6 Mg Tabs (Colchicine) .... Take 1 Tab By Mouth Two Times A Day Up To 5 Days For Acute Attack; Then 1 Tab Daily For Prevention. 11)  Zyrtec-D Allergy & Congestion 5-120 Mg Xr12h-Tab (Cetirizine-Pseudoephedrine) .... Take 1 Tablet By Mouth Two Times A Day For 1 Month 12)  Proair Hfa 108 (90 Base) Mcg/act Aers (Albuterol Sulfate) .... 2 Puffs Every 4 Hours As Needed For Shortness of Breath 13)  Colcrys 0.6 Mg Tabs (Colchicine) .... Take 1 Pill By  Mouth Two Times A Day As Needed For Pain From Gout. 14)  Allopurinol 100 Mg Tabs (Allopurinol) .... Take 1 Pill By Mouth Daily.  Allergies: 1)  ! Darvon-N  Review of Systems      See HPI  Physical Exam  Mouth:  pharynx pink and moist.   Lungs:  normal breath sounds, no crackles, and no wheezes.   Heart:  normal rate, regular rhythm, no murmur, and no gallop.   Msk:  R. Foot: there is minimal redness, some swelling centered around the right first metatarsal head. There is some tenderness on movement of the first great toe. There is only minimal tenderness at the site of achilles tendon, without redness/swelling.  Neurologic:  alert & oriented X3.     Impression & Recommendations:  Problem # 1:  GOUT, UNSPECIFIED (ICD-274.9) I know the pt doesnot have any joint aspiration for the diagnosis  of gout, but she has typical symptoms of gout that has responded in the past with colchicine. She had 3 episodes of attacks on the right great toe and 1 episode of pain on the right ankle at the site of achilles tendon in a year. I think she will benefit from preventive therapy and will start her on allopurinol. Will continue colchicine. She is aware not to take allopurinol until her pain resolves. Will f/u in a week.   The following medications were removed from the medication list:    Colcrys 0.6 Mg Tabs (Colchicine) .Marland Kitchen... Take 1 tab by mouth two times a day up to 5 days for acute attack; then 1 tab daily for prevention. Her updated medication list for this problem includes:    Colcrys 0.6 Mg Tabs (Colchicine) .Marland Kitchen... Take 1 pill by mouth two times a day as needed for pain from gout.    Allopurinol 100 Mg Tabs (Allopurinol) .Marland Kitchen... Take 1 pill by mouth daily.  Complete Medication List: 1)  Fluoxetine Hcl 20 Mg Caps (Fluoxetine hcl) .... Take 1 tablet by mouth once a day 2)  Toprol Xl 200 Mg Tb24 (Metoprolol succinate) .... Take 1 tablet by mouth once a day 3)  Amitriptyline Hcl 50 Mg Tabs (Amitriptyline hcl) .... Take 1 tablet at bedtime 4)  Catapres 0.1 Mg Tabs (Clonidine hcl) .... Take 1 tablet by mouth two times a day 5)  Multivitamins Caps (Multiple vitamin) .... Take 1 tablet by mouth once a day 6)  Timoptic Ocudose 0.25 % Soln (Timolol maleate) .... As prescribed by ophtalmologist 7)  Alphagan P 0.1 % Soln (Brimonidine tartrate) .... As prescribed by ophtalmologist 8)  Omeprazole 40 Mg Cpdr (Omeprazole) .... Take one tablet by mouth two times a day 9)  Norvasc 5 Mg Tabs (Amlodipine besylate) .... Take 1 tablet by mouth once a day 10)  Zyrtec-d Allergy & Congestion 5-120 Mg Xr12h-tab (Cetirizine-pseudoephedrine) .... Take 1 tablet by mouth two times a day for 1 month 11)  Proair Hfa 108 (90 Base) Mcg/act Aers (Albuterol sulfate) .... 2 puffs every 4 hours as needed for shortness of breath 12)   Colcrys 0.6 Mg Tabs (Colchicine) .... Take 1 pill by mouth two times a day as needed for pain from gout. 13)  Allopurinol 100 Mg Tabs (Allopurinol) .... Take 1 pill by mouth daily.  Patient Instructions: 1)  f/u in a week.  2)  Start taking allopurinol only after the pain in the foot is resolved.  3)  Limit your Sodium (Salt) to less than 2 grams a day(slightly less than 1/2 a teaspoon)  to prevent fluid retention, swelling, or worsening of symptoms. 4)  It is important that you exercise regularly at least 20 minutes 5 times a week. If you develop chest pain, have severe difficulty breathing, or feel very tired , stop exercising immediately and seek medical attention. 5)  You need to lose weight. Consider a lower calorie diet and regular exercise.  Prescriptions: ALLOPURINOL 100 MG TABS (ALLOPURINOL) take 1 pill by mouth daily.  #30 x 0   Entered and Authorized by:   Jason Coop MD   Signed by:   Jason Coop MD on 01/14/2010   Method used:   Electronically to        [facility]  [phone]* (retail)       [address]       [place], [place]  [address]       Ph: [phone] or [phone]       Fax: [phone]   RxID:   [id number]    Prevention & Chronic Care Immunizations   Influenza vaccine: Fluvax MCR  (05/03/2009)    Tetanus booster: Not documented    Pneumococcal vaccine: Pneumovax  (10/12/2009)    H. zoster vaccine: Not documented  Colorectal Screening   Hemoccult: Not documented    Colonoscopy: Sigmoid diverticulosis  (09/28/2006)   Colonoscopy due: 09/28/2016  Other Screening   Pap smear: Not documented   Pap smear action/deferral: Deferred  (05/03/2009)    Mammogram: ASSESSMENT: Negative - BI-RADS 1^MM DIGITAL SCREENING  (05/19/2009)   Mammogram action/deferral: Deferred  (05/03/2009)    DXA bone density scan: Not documented   Smoking status: quit  (01/14/2010)  Lipids   Total Cholesterol: 148   (01/22/2009)   LDL: 70  (01/22/2009)   LDL Direct: Not documented   HDL: 46  (01/22/2009)   Triglycerides: 160  (01/22/2009)  Hypertension   Last Blood Pressure: 115 / 64  (01/14/2010)   Serum creatinine: 0.66  (05/03/2009)   Serum potassium 3.2  (05/03/2009)  Self-Management Support :   Personal Goals (by the next clinic visit) :      Personal blood pressure goal: 140/90  (05/03/2009)   Patient will work on the following items until the next clinic visit to reach self-care goals:     Medications and monitoring: take my medicines every day, check my blood pressure, bring all of my medications to every visit  (01/14/2010)     Eating: eat more vegetables, use fresh or frozen vegetables, eat foods that are low in salt, eat fruit for snacks and desserts  (01/14/2010)    Hypertension self-management support: Written self-care plan, Education handout, Resources for patients handout  (01/14/2010)   Hypertension self-care plan printed.   Hypertension education handout printed      Resource handout printed.

## 2010-09-06 NOTE — Progress Notes (Signed)
Summary: refill/ hla  Phone Note Refill Request Message from:  Patient on January 11, 2010 12:13 PM  Refills Requested: Medication #1:  COLCHICINE 0.6 MG TABS Take one pill by mouth twice daily for gout flare   Dosage confirmed as above?Dosage Confirmed Initial call taken by: Marin Roberts RN,  January 11, 2010 12:13 PM  Follow-up for Phone Call        Refill approved-nurse to complete  Additional Follow-up for Phone Call Additional follow up Details #1::        Colchicine is not generic anymore; not sure if ashe would be able to pay for it. I will sent prescription, but if she is having a gout flare and is unable to pay for colcrys, would be a good idea to see her in the clinic.    New/Updated Medications: COLCRYS 0.6 MG TABS (COLCHICINE) Take 1 tab by mouth two times a day up to 5 days for acute attack; then 1 tab daily for prevention. Prescriptions: COLCRYS 0.6 MG TABS (COLCHICINE) Take 1 tab by mouth two times a day up to 5 days for acute attack; then 1 tab daily for prevention.  #45 x 1   Entered and Authorized by:   Vassie Loll MD   Signed by:   Vassie Loll MD on 01/12/2010   Method used:   Electronically to        [facility]  [phone]* (retail)       [address]       [place], [place]  [address]       Ph: [phone] or [phone]       Fax: [phone]   RxID:   [id number]

## 2010-09-06 NOTE — Medication Information (Signed)
Summary: [facility]: Approval Of RX Coverage  [facility]: Approval Of RX Coverage   Imported By: Florinda Marker 09/06/2009 15:43:09  _____________________________________________________________________  External Attachment:    Type:   Image     Comment:   External Document

## 2010-09-06 NOTE — Progress Notes (Signed)
Summary: REFILL/GG  Phone Note Refill Request  on February 03, 2010 11:32 AM  Refills Requested: Medication #1:  CATAPRES 0.1 MG TABS Take 1 tablet by mouth two times a day  Method Requested: Fax to Local Pharmacy Initial call taken by: Merrie Roof RN,  February 03, 2010 11:34 AM  Follow-up for Phone Call        Refill approved-nurse to complete    Prescriptions: CATAPRES 0.1 MG TABS (CLONIDINE HCL) Take 1 tablet by mouth two times a day  #62 x 6   Entered and Authorized by:   Vassie Loll MD   Signed by:   Vassie Loll MD on 02/03/2010   Method used:   Electronically to        [facility]* (retail)       [address]       [place], [place]  [address]       Ph: [phone]       Fax: [phone]   RxID:   [id number]

## 2010-09-06 NOTE — Progress Notes (Signed)
Summary: refill/ hla  Phone Note Refill Request Message from:  Fax from Pharmacy on June 20, 2010 5:01 PM  Refills Requested: Medication #1:  TOPROL XL 200 MG  TB24 Take 1 tablet by mouth once a day   Dosage confirmed as above?Dosage Confirmed  Medication #2:  NORVASC 5 MG TABS Take 1 tablet by mouth once a day   Dosage confirmed as above?Dosage Confirmed Initial call taken by: Marin Roberts RN,  June 20, 2010 5:01 PM  Follow-up for Phone Call        Refill approved-nurse to complete. I will approved 3 refills, before any more refills provided patient need to be seen.  Additional Follow-up for Phone Call Additional follow up Details #1::        Make sure patient has a followup appointment soon; last time she was seen was six month ago; she also will need labwork during her visit (in order to make sure her kidneys and electrolytes are stable).  Thanks...    Prescriptions: NORVASC 5 MG TABS (AMLODIPINE BESYLATE) Take 1 tablet by mouth once a day  #90 x 3   Entered and Authorized by:   Vassie Loll MD   Signed by:   Vassie Loll MD on 06/21/2010   Method used:   Electronically to        [facility]* (retail)       [address] [place], [place]  [address]       Ph: [phone]       Fax: [phone]   RxID:   [id number] TOPROL XL 200 MG  TB24 (METOPROLOL SUCCINATE) Take 1 tablet by mouth once a day  #90 x 3   Entered and Authorized by:   Vassie Loll MD   Signed by:   Vassie Loll MD on 06/21/2010   Method used:   Electronically to        [facility]* (retail)       [address] [place], [place]  [address]       Ph: [phone]       Fax: [phone]   RxID:   [id number]

## 2010-09-08 ENCOUNTER — Encounter: Payer: Self-pay | Admitting: Internal Medicine

## 2010-09-08 DIAGNOSIS — K7581 Nonalcoholic steatohepatitis (NASH): Secondary | ICD-10-CM | POA: Insufficient documentation

## 2010-09-08 DIAGNOSIS — Z8679 Personal history of other diseases of the circulatory system: Secondary | ICD-10-CM | POA: Insufficient documentation

## 2010-09-08 NOTE — Progress Notes (Signed)
Summary: Refill/gh  Phone Note Refill Request Message from:  Fax from Pharmacy on August 31, 2010 3:55 PM  Refills Requested: Medication #1:  CATAPRES 0.1 MG TABS Take 1 tablet by mouth two times a day  Method Requested: Fax to Local Pharmacy Initial call taken by: Angelina Ok RN,  August 31, 2010 3:55 PM  Follow-up for Phone Call        Got 4 month supply in Nov - still has refills.  I sent a flag to Ms Lissa Hoard to schedule an appt.  Follow-up by: Blanch Media MD,  August 31, 2010 8:04 PM

## 2010-09-08 NOTE — Letter (Signed)
Summary: [facility]   [facility]   Imported By: Margie Billet 08/17/2010 11:54:38  _____________________________________________________________________  External Attachment:    Type:   Image     Comment:   External Document

## 2010-09-08 NOTE — Consult Note (Signed)
Summary: [facility]   Imported By: Margie Billet 08/26/2010 13:42:48  _____________________________________________________________________  External Attachment:    Type:   Image     Comment:   External Document

## 2010-09-09 ENCOUNTER — Encounter: Payer: Self-pay | Admitting: Internal Medicine

## 2010-09-09 NOTE — Medication Information (Signed)
Summary: [employer] [facility]: RX  [facility]: RX   Imported By: Florinda Marker 09/02/2009 11:47:46  _____________________________________________________________________  External Attachment:    Type:   Image     Comment:   External Document  Appended Document: [employer] [facility]: RX Prior Authorization has been approved for Omperazole 40mg   until 09/02/11.  [facility] [address] made awared per pt.'s request.

## 2010-09-13 ENCOUNTER — Ambulatory Visit: Payer: Self-pay | Admitting: Internal Medicine

## 2010-09-14 ENCOUNTER — Other Ambulatory Visit: Payer: Self-pay | Admitting: *Deleted

## 2010-09-14 MED ORDER — COLCHICINE 0.6 MG PO TABS: 0.6000 mg | ORAL_TABLET | Freq: Two times a day (BID) | ORAL | Status: DC | PRN

## 2010-10-11 ENCOUNTER — Telehealth: Payer: Self-pay | Admitting: *Deleted

## 2010-10-11 NOTE — Telephone Encounter (Signed)
Pt called stating she was seen at [facility]  For pneumonia and cough.  She was given meds  And sent to orthopedic for knee pain. Now her legs are swelling and she feels her BP is elevated.  Will see tomorrow

## 2010-10-12 ENCOUNTER — Ambulatory Visit (INDEPENDENT_AMBULATORY_CARE_PROVIDER_SITE_OTHER): Payer: Medicare PPO | Admitting: Internal Medicine

## 2010-10-12 ENCOUNTER — Encounter: Payer: Self-pay | Admitting: Internal Medicine

## 2010-10-12 DIAGNOSIS — Z8679 Personal history of other diseases of the circulatory system: Secondary | ICD-10-CM

## 2010-10-12 DIAGNOSIS — M109 Gout, unspecified: Secondary | ICD-10-CM

## 2010-10-12 DIAGNOSIS — I10 Essential (primary) hypertension: Secondary | ICD-10-CM

## 2010-10-12 DIAGNOSIS — K7689 Other specified diseases of liver: Secondary | ICD-10-CM

## 2010-10-12 DIAGNOSIS — K219 Gastro-esophageal reflux disease without esophagitis: Secondary | ICD-10-CM

## 2010-10-12 DIAGNOSIS — J40 Bronchitis, not specified as acute or chronic: Secondary | ICD-10-CM

## 2010-10-12 DIAGNOSIS — R609 Edema, unspecified: Secondary | ICD-10-CM

## 2010-10-12 DIAGNOSIS — K7581 Nonalcoholic steatohepatitis (NASH): Secondary | ICD-10-CM

## 2010-10-12 DIAGNOSIS — K222 Esophageal obstruction: Secondary | ICD-10-CM

## 2010-10-12 DIAGNOSIS — R600 Localized edema: Secondary | ICD-10-CM

## 2010-10-12 LAB — CBC WITH DIFFERENTIAL/PLATELET
Basophils Absolute: 0 10*3/uL (ref 0.0–0.1)
Basophils Relative: 0 % (ref 0–1)
Eosinophils Relative: 0 % (ref 0–5)
HCT: 38.9 % (ref 36.0–46.0)
Hemoglobin: 12.8 g/dL (ref 12.0–15.0)
Lymphocytes Relative: 49 % — ABNORMAL HIGH (ref 12–46)
MCHC: 32.9 g/dL (ref 30.0–36.0)
MCV: 98.7 fL (ref 78.0–100.0)
Monocytes Absolute: 0.4 10*3/uL (ref 0.1–1.0)
Monocytes Relative: 6 % (ref 3–12)
Neutro Abs: 3 10*3/uL (ref 1.7–7.7)
RDW: 13.4 % (ref 11.5–15.5)

## 2010-10-12 LAB — COMPREHENSIVE METABOLIC PANEL
Albumin: 4.4 g/dL (ref 3.5–5.2)
BUN: 6 mg/dL (ref 6–23)
Calcium: 9.4 mg/dL (ref 8.4–10.5)
Chloride: 100 mEq/L (ref 96–112)
Creat: 0.48 mg/dL (ref 0.40–1.20)
Glucose, Bld: 99 mg/dL (ref 70–99)
Potassium: 4.2 mEq/L (ref 3.5–5.3)

## 2010-10-12 MED ORDER — TORSEMIDE 5 MG PO TABS: 5.0000 mg | ORAL_TABLET | Freq: Every day | ORAL | Status: DC

## 2010-10-12 MED ORDER — METOPROLOL TARTRATE 100 MG PO TABS: 100.0000 mg | ORAL_TABLET | Freq: Two times a day (BID) | ORAL | Status: DC

## 2010-10-12 MED ORDER — SPIRONOLACTONE 50 MG PO TABS: ORAL_TABLET | ORAL | Status: DC

## 2010-10-12 NOTE — Assessment & Plan Note (Signed)
SR today, see changes in hypertension

## 2010-10-12 NOTE — Assessment & Plan Note (Signed)
Check LFTs today Last approx 1 year ago and AST/ALT approx 50s

## 2010-10-12 NOTE — Assessment & Plan Note (Signed)
Please refer to medication changes in hypertension. Will check CMP (protien and albumin and Cr) and UA (to assess for proteinuria)

## 2010-10-12 NOTE — Assessment & Plan Note (Signed)
Would start allopurinol given regular use of colchicine and approx 3 flares last year. However, deferred on today's visit given all the other med changes.

## 2010-10-12 NOTE — Assessment & Plan Note (Addendum)
Not convinced pt actually had pneumonia per her reports. Assume this is more likely a viral bronchitis or atypical bacterial bronchitis (treated with abx at [facility]).  Will check CBC to assess for leukocytosis but will not pursue imaging as exam unremarkable. Symptomatic tx with OTC anti-tussives

## 2010-10-12 NOTE — Assessment & Plan Note (Signed)
Pt reports elevated blood pressures at home and elevated today. She reports adherence to current regimen, although HR 101 on arrival and about 90 on exam despite Toprol 200mg  po daily raising the question of non-adherence.  Will make the following changes: Stop norvasc given LE swelling as this may be contributing. Start low dose torsemide (coverage) to help with swelling Start low dose spironolactone to synergize diuresis and prevent hypokalemia Change Toprol XL 200mg  to metoprolol tartrate 100mg  bid for ease of swallowing (smaller and can be crushed). Check labs today and repeat BMP in 1 week to assess Cr and K.

## 2010-10-12 NOTE — Progress Notes (Signed)
  Subjective:    Patient ID: Heather [name], female    DOB: April 01, 1941, 70 y.o.   MRN: [id number]  HPI Blood pressure has been elevated.  Also has swelling in legs, present for few years L>R.  Swelling has increased since fall 2 weeks ago, reports that left leg gives way occasionally.  Has undergone rehab for this in the past.  Seen at [facility] for productive cough about 2 weeks ago.  Told she had pneumonia and was given antibiotics.  Cough was main symptom, still present though present.  Sputum is currently clear, compared to thick yellow sputum previously.  Worse in the mornings. Has been having difficulty swallowing, mainly pills over time and slowly worsening.  Improved after last esophageal dilation and similar to prior episodes.    Review of Systems  Constitutional: Negative for fever.  HENT: Negative for rhinorrhea and postnasal drip.   Respiratory: Positive for cough. Negative for shortness of breath and wheezing.   Cardiovascular: Negative for chest pain and palpitations.       Objective:   Physical Exam  Constitutional: She is oriented to person, place, and time. She appears well-developed and well-nourished. No distress.  HENT:  Head: Normocephalic and atraumatic.  Mouth/Throat: Oropharynx is clear and moist. No oropharyngeal exudate.  Eyes: Conjunctivae and EOM are normal. Pupils are equal, round, and reactive to light. No scleral icterus.  Neck: Neck supple. No JVD present.  Cardiovascular: Normal rate and regular rhythm.  Exam reveals gallop. Exam reveals no friction rub.   Murmur heard.      S1S2 with S4 gallop.  RRR.  Grade 1-2/6 systolic murmur best heard at RSB 2nd ICS.  Pulmonary/Chest: Effort normal and breath sounds normal. No respiratory distress. She has no wheezes. She has no rales.  Abdominal: Soft. Bowel sounds are normal. There is no tenderness.  Musculoskeletal: She exhibits edema.       +1-2 LLE edema +1 RLE edema  Neurological: She is alert and oriented to  person, place, and time. No cranial nerve deficit.       Normal gait  Skin: No rash noted.          Assessment & Plan:

## 2010-10-12 NOTE — Assessment & Plan Note (Signed)
Continue PPI in light of stricutre

## 2010-10-12 NOTE — Assessment & Plan Note (Signed)
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 Pt does not wish to undergo procedure at this time as she is mainly having trouble with pills but tolerating food with plenty of liquids. Discussed potential barium swallow but will defer in light of not wishing to pursue dilation at this time.

## 2010-10-12 NOTE — Patient Instructions (Signed)
Return to clinic in 1 week to repeat labs, if there are any problems we will call you. Follow up in 1 month. Some medicines have been changed as discussed. If you have any problems, call clinic.  Can use over the counter cough medicine.

## 2010-10-13 LAB — URINALYSIS
Bilirubin Urine: NEGATIVE
Glucose, UA: NEGATIVE mg/dL
Specific Gravity, Urine: 1.008 (ref 1.005–1.030)
pH: 7 (ref 5.0–8.0)

## 2010-10-19 ENCOUNTER — Other Ambulatory Visit: Payer: Medicare PPO

## 2010-10-19 DIAGNOSIS — I10 Essential (primary) hypertension: Secondary | ICD-10-CM

## 2010-10-19 LAB — BASIC METABOLIC PANEL
CO2: 30 mEq/L (ref 19–32)
Glucose, Bld: 104 mg/dL — ABNORMAL HIGH (ref 70–99)
Potassium: 4 mEq/L (ref 3.5–5.3)
Sodium: 139 mEq/L (ref 135–145)

## 2010-10-27 ENCOUNTER — Other Ambulatory Visit: Payer: Self-pay | Admitting: *Deleted

## 2010-10-27 NOTE — Telephone Encounter (Signed)
Pt was Rx'd in 2011 but never took med. Saw Dr Onalee Hua recently and he did not start med. Med not on med list.

## 2010-11-08 ENCOUNTER — Encounter: Payer: Self-pay | Admitting: Internal Medicine

## 2010-11-08 ENCOUNTER — Ambulatory Visit (INDEPENDENT_AMBULATORY_CARE_PROVIDER_SITE_OTHER): Payer: Medicare PPO | Admitting: Internal Medicine

## 2010-11-08 VITALS — BP 170/85 | HR 82 | Temp 97.0°F | Ht 70.0 in | Wt 197.7 lb

## 2010-11-08 DIAGNOSIS — J301 Allergic rhinitis due to pollen: Secondary | ICD-10-CM

## 2010-11-08 DIAGNOSIS — Z0000 Encounter for general adult medical examination without abnormal findings: Secondary | ICD-10-CM | POA: Insufficient documentation

## 2010-11-08 DIAGNOSIS — K222 Esophageal obstruction: Secondary | ICD-10-CM

## 2010-11-08 DIAGNOSIS — K219 Gastro-esophageal reflux disease without esophagitis: Secondary | ICD-10-CM

## 2010-11-08 DIAGNOSIS — R87612 Low grade squamous intraepithelial lesion on cytologic smear of cervix (LGSIL): Secondary | ICD-10-CM

## 2010-11-08 DIAGNOSIS — IMO0002 Reserved for concepts with insufficient information to code with codable children: Secondary | ICD-10-CM

## 2010-11-08 DIAGNOSIS — F329 Major depressive disorder, single episode, unspecified: Secondary | ICD-10-CM

## 2010-11-08 DIAGNOSIS — M109 Gout, unspecified: Secondary | ICD-10-CM

## 2010-11-08 DIAGNOSIS — I10 Essential (primary) hypertension: Secondary | ICD-10-CM

## 2010-11-08 LAB — GLUCOSE, CAPILLARY: Glucose-Capillary: 107 mg/dL — ABNORMAL HIGH (ref 70–99)

## 2010-11-08 MED ORDER — COLCHICINE 0.6 MG PO TABS: 0.6000 mg | ORAL_TABLET | Freq: Two times a day (BID) | ORAL | Status: DC | PRN

## 2010-11-08 MED ORDER — CLONIDINE HCL 0.1 MG PO TABS: 0.1000 mg | ORAL_TABLET | Freq: Two times a day (BID) | ORAL | Status: DC

## 2010-11-08 MED ORDER — FLUTICASONE PROPIONATE 50 MCG/ACT NA SUSP: 2.0000 | Freq: Every day | NASAL | Status: DC

## 2010-11-08 MED ORDER — METOPROLOL TARTRATE 100 MG PO TABS: 100.0000 mg | ORAL_TABLET | Freq: Two times a day (BID) | ORAL | Status: DC

## 2010-11-08 MED ORDER — TORSEMIDE 5 MG PO TABS: 5.0000 mg | ORAL_TABLET | Freq: Every day | ORAL | Status: DC

## 2010-11-08 MED ORDER — SPIRONOLACTONE 50 MG PO TABS: ORAL_TABLET | ORAL | Status: DC

## 2010-11-08 NOTE — Progress Notes (Signed)
  Subjective:    Patient ID: Heather [name], female    DOB: [birth year], 70 y.o.   MRN: [id number]  HPI  Please see the A&P for the status of the pt's chronic medical problems.   Review of Systems  Constitutional: Negative for fever, appetite change and unexpected weight change.  HENT: Positive for congestion, rhinorrhea and postnasal drip. Negative for sneezing.   Eyes: Positive for visual disturbance. Negative for discharge, redness and itching.  Respiratory: Positive for cough. Negative for choking, shortness of breath and wheezing.   Cardiovascular: Negative for chest pain and leg swelling.  Gastrointestinal: Negative for diarrhea, constipation and blood in stool.  Genitourinary: Negative for dysuria, hematuria and vaginal bleeding.  Musculoskeletal: Negative for myalgias and arthralgias.  Skin: Negative for color change and rash.  Neurological: Negative for dizziness and headaches.  Psychiatric/Behavioral: Negative for sleep disturbance.       Objective:   Physical Exam  Constitutional: She is oriented to person, place, and time. She appears well-developed and well-nourished. She appears distressed.  HENT:  Head: Normocephalic and atraumatic.  Right Ear: External ear normal.  Left Ear: External ear normal.  Eyes: Conjunctivae are normal.  Neck: Normal range of motion. Neck supple. No thyromegaly present.  Cardiovascular: Normal rate, regular rhythm and normal heart sounds.  Exam reveals no gallop and no friction rub.   No murmur heard. Pulmonary/Chest: Effort normal and breath sounds normal.  Musculoskeletal: Normal range of motion. She exhibits edema. She exhibits no tenderness.  Lymphadenopathy:    She has no cervical adenopathy.  Neurological: She is alert and oriented to person, place, and time.  Skin: Skin is warm and dry. No rash noted. She is not diaphoretic. There is erythema.  Psychiatric: She has a normal mood and affect. Her behavior is normal. Judgment and  thought content normal.          Assessment & Plan:

## 2010-11-08 NOTE — Assessment & Plan Note (Signed)
UTD on everything except Tdap (thinks had it less than 10 yrs ago) and zoster. Will investigate last Tdap.

## 2010-11-08 NOTE — Patient Instructions (Signed)
Try the nose spray to see if helps with allergies

## 2010-11-08 NOTE — Assessment & Plan Note (Signed)
Uses the colchicine PRN when she gets a flare. Last flare was one month ago. Due to confusion over BP meds, will not start allopurinol.

## 2010-11-08 NOTE — Assessment & Plan Note (Signed)
OK as long as on PPI's.

## 2010-11-08 NOTE — Assessment & Plan Note (Signed)
Per pt, the SIL came after the TAH. Will review PAP - was their a transitional zone?

## 2010-11-08 NOTE — Assessment & Plan Note (Signed)
Has had daily dry cough for one month. Other allergy sxs. Claritin D not controlling sxs. Trial of fluticasone nasal spray. If no improvement, and cough persists, will need CXR and maybe PFT's.

## 2010-11-08 NOTE — Assessment & Plan Note (Addendum)
BP remains elevated today. Dr Onalee Hua made several changes last month - D/C Norvasc 2/2 LE edema (did help to decrease the edema). Toprol XL was stopped - pills were too big - and metoprolol was begun - pills are much smaller. Torsemide and spironolactone were added. The problem is that the pt doesn't know what she is taking. When asked, she handed me the AVS from the last visit but didn't know names and several meds were not on the list. Her BP nor HR have not budged. I wonder about compliance / confusion over meds. I did not make any changes to meds. Rather, I printed off all new paper scripts and handed them to the pt so that she could review her bottles and ask pharmacist for assistance. RTC one month or BP check, med compliance, and BMP. May need to D/C Claritin D if BP remains elevated.

## 2010-11-08 NOTE — Assessment & Plan Note (Signed)
Her sxs improved after starting her Prozac. She occasionally gets a "tingling" - I am not sure what to make of that.

## 2010-11-08 NOTE — Assessment & Plan Note (Signed)
No dysphagia to solids as long as she takes plenty of liquids. Does have trouble with pills. Knows the sxs of the stricture are returning but not yet ready to undergo another dilation.

## 2010-11-22 ENCOUNTER — Ambulatory Visit: Payer: Medicare PPO | Admitting: Ophthalmology

## 2010-11-24 ENCOUNTER — Ambulatory Visit (INDEPENDENT_AMBULATORY_CARE_PROVIDER_SITE_OTHER): Payer: Medicare PPO | Admitting: Ophthalmology

## 2010-11-24 VITALS — BP 170/86 | HR 80 | Temp 98.7°F | Ht 70.0 in | Wt 196.7 lb

## 2010-11-24 DIAGNOSIS — K222 Esophageal obstruction: Secondary | ICD-10-CM

## 2010-11-24 DIAGNOSIS — I10 Essential (primary) hypertension: Secondary | ICD-10-CM

## 2010-11-24 DIAGNOSIS — R21 Rash and other nonspecific skin eruption: Secondary | ICD-10-CM | POA: Insufficient documentation

## 2010-11-24 MED ORDER — CHLORTHALIDONE 25 MG PO TABS: 25.0000 mg | ORAL_TABLET | Freq: Every day | ORAL | Status: DC

## 2010-11-24 MED ORDER — METOPROLOL TARTRATE 100 MG PO TABS: 150.0000 mg | ORAL_TABLET | Freq: Two times a day (BID) | ORAL | Status: DC

## 2010-11-24 NOTE — Assessment & Plan Note (Addendum)
The patient's blood pressure continues to be elevated at this visit and was 170/86 with a pulse of 80.  The patient says that she is using all of her medications regularly so adjustment of her medications is necessary. I will plan to increase the patient's Lopressor to 150 mg twice a day as she does not wish to go back on the extended release Lopressor due to her esophageal dysmotility. I will also change torsemide to chlorthalidone 25 mg daily for the added blood pressure benefit with continued mild diuresis.  I would like the patient to followup in 2-3 weeks for repeat blood pressure check.

## 2010-11-24 NOTE — Assessment & Plan Note (Signed)
As noted in the physical exam, the patient has a rash on her right wrist, and left upper extremity. The rash is erythematous, nonpruritic, with multiple vesicles in chains. The rash is most consistent with poison ivy given that the patient also has recently been pulling weeds in her garden. I told patient that she should purchase over-the-counter rhuli gel for symptomatic relief. I informed patient that if the rash were to continue to spread or get worse, she should contact the clinic.

## 2010-11-24 NOTE — Assessment & Plan Note (Signed)
The patient continues to have symptoms from her esophageal dysmotility, and for that reason wishes to avoid large pills.

## 2010-11-24 NOTE — Progress Notes (Signed)
Subjective:    Patient ID: Heather [name], female    DOB: 02-27-1941, 70 y.o.   MRN: [id number]  HPI This is a 70 year old female with a past medical history significant for hypertension, esophageal dysmotility, and GERD, who presents for followup on elevated blood pressure. The patient's blood pressure at the office today was 170/86, it appears to have been elevated over her last several visits. The patient previously had extensive changes in her medication list made by Dr. Onalee Hua because the patient was not tolerating extended release Lopressor because of the size, because Norvasc was causing lower extremity edema, and torsemide was added to help improve her edema. The patient has remained completely asymptomatic from her hypertension, and denies any changes in her vision, weakness, or any other neurologic symptoms or headache. The patient's only other concern today is a pruritic rash on her right wrist and on her left upper arm. The patient has had a rash over about the last week, and does relate having done some previous yardwork where she was pulling weeds.   Review of Systems  Constitutional: Negative for fever and chills.  Respiratory: Negative for cough and shortness of breath.   Cardiovascular: Negative for chest pain and palpitations.  Gastrointestinal: Negative for vomiting, diarrhea and constipation.       Objective:   Physical Exam  Constitutional: She appears well-developed and well-nourished.  HENT:  Head: Normocephalic and atraumatic.  Eyes: Pupils are equal, round, and reactive to light.  Cardiovascular: Normal rate, regular rhythm and intact distal pulses.  Exam reveals no gallop and no friction rub.   No murmur heard. Pulmonary/Chest: Effort normal and breath sounds normal. She has no wheezes. She has no rales.  Abdominal: Soft. Bowel sounds are normal. She exhibits no distension. There is no tenderness.  Musculoskeletal: Normal range of motion.       There is  trace lower extremity edema.  Neurological: She is alert. No cranial nerve deficit.  Skin: Rash noted.       There is a 4 cm confluent erythematous rash with multiple small vesicles on the left upper arm, with noted excoriations. There also several small vesicles on the right wrist.        Current Outpatient Prescriptions on File Prior to Visit  Medication Sig Dispense Refill  . albuterol (PROVENTIL,VENTOLIN) 90 MCG/ACT inhaler Inhale 2 puffs into the lungs every 4 (four) hours as needed.        Marland Kitchen amitriptyline (ELAVIL) 50 MG tablet Take 50 mg by mouth at bedtime.        . brimonidine (ALPHAGAN P) 0.1 % SOLN        . cetirizine-pseudoephedrine (ZYRTEC-D) 5-120 MG per tablet Take 1 tablet by mouth 2 (two) times daily.        . cloNIDine (CATAPRES) 0.1 MG tablet Take 1 tablet (0.1 mg total) by mouth 2 (two) times daily.  60 tablet  3  . colchicine 0.6 MG tablet Take 1 tablet (0.6 mg total) by mouth 2 (two) times daily as needed.  60 tablet  0  . FLUoxetine (PROZAC) 20 MG capsule Take 20 mg by mouth daily.        . fluticasone (FLONASE) 50 MCG/ACT nasal spray 2 sprays by Nasal route daily.  16 g  12  . omeprazole (PRILOSEC) 40 MG capsule Take 40 mg by mouth 2 (two) times daily.        Marland Kitchen spironolactone (ALDACTONE) 50 MG tablet Take 1/2 tablet by mouth daily  30  tablet  3  . timolol (TIMOPTIC) 0.25 % ophthalmic solution 1 drop 2 (two) times daily.        Marland Kitchen DISCONTD: metoprolol (LOPRESSOR) 100 MG tablet Take 1 tablet (100 mg total) by mouth 2 (two) times daily.  60 tablet  3  . DISCONTD: torsemide (DEMADEX) 5 MG tablet Take 1 tablet (5 mg total) by mouth daily.  30 tablet  11   Past Medical History  Diagnosis Date  . Depression     Controlled with SSRI  . HTN (hypertension)   . GERD (gastroesophageal reflux disease)     Chronic PPI's  . S/P dilatation of esophageal stricture     12/09 Dr Leone Payor. Dilation of tight stricture proximal eso.  Prior dilation 2/08.  . Seasonal allergies      Zyrtec D PRN  . NASH (nonalcoholic steatohepatitis)     Per [place] 10/10  . History of PSVT (paroxysmal supraventricular tachycardia)     Remote and self limited.  . Gout     No crystal dx. R podegra x 3 episodes around 2010. Did not take the allopurinol that was rec 2011.  Marland Kitchen LGSIL (low grade squamous intraepithelial lesion) on Pap smear 02/06/07    HPV Gyn 8/08 negative     Assessment & Plan:

## 2010-11-24 NOTE — Patient Instructions (Signed)
Please followup in 2-3 weeks for repeat blood pressure check. Call sooner if you have any problems.

## 2010-11-30 ENCOUNTER — Other Ambulatory Visit: Payer: Self-pay | Admitting: *Deleted

## 2010-11-30 MED ORDER — OMEPRAZOLE 40 MG PO CPDR: 40.0000 mg | DELAYED_RELEASE_CAPSULE | Freq: Two times a day (BID) | ORAL | Status: DC

## 2010-12-05 ENCOUNTER — Ambulatory Visit (INDEPENDENT_AMBULATORY_CARE_PROVIDER_SITE_OTHER): Payer: Medicare PPO | Admitting: Ophthalmology

## 2010-12-05 ENCOUNTER — Telehealth: Payer: Self-pay | Admitting: *Deleted

## 2010-12-05 ENCOUNTER — Encounter: Payer: Self-pay | Admitting: Ophthalmology

## 2010-12-05 VITALS — BP 146/83 | HR 78 | Temp 97.7°F | Ht 70.0 in | Wt 194.6 lb

## 2010-12-05 DIAGNOSIS — I10 Essential (primary) hypertension: Secondary | ICD-10-CM

## 2010-12-05 DIAGNOSIS — K219 Gastro-esophageal reflux disease without esophagitis: Secondary | ICD-10-CM

## 2010-12-05 DIAGNOSIS — R059 Cough, unspecified: Secondary | ICD-10-CM | POA: Insufficient documentation

## 2010-12-05 DIAGNOSIS — R05 Cough: Secondary | ICD-10-CM | POA: Insufficient documentation

## 2010-12-05 MED ORDER — METOPROLOL TARTRATE 100 MG PO TABS: 100.0000 mg | ORAL_TABLET | Freq: Two times a day (BID) | ORAL | Status: DC

## 2010-12-05 MED ORDER — BENZONATATE 100 MG PO CAPS: 100.0000 mg | ORAL_CAPSULE | Freq: Three times a day (TID) | ORAL | Status: DC | PRN

## 2010-12-05 NOTE — Assessment & Plan Note (Signed)
The patients GERD is under good control at this time and the patient is taking their medications regularly.  The patient was encouraged to continue to avoid triggers, avoid laying flat within two hours of a meal.  Will continue to monitor for continued control at future visits.   

## 2010-12-05 NOTE — Progress Notes (Signed)
Subjective:    Patient ID: Heather [name], female    DOB: September 08, 1940, 70 y.o.   MRN: [id number]  HPI  This is a 70 year old female with a past medical history significant for hypertension, GERD, esophageal dysmotility, who presents because of a one-month history of increased cough and congestion. The patient was last seen by me on April 19, for elevated blood pressures. Blood pressure at that time was 170/86, and I increased her Lopressor to 150 twice a day and changed her torsemide to chlorthalidone 25 mg daily. The patient also had poison ivy at that time and I recommended Rhuli gel. Since that time, the patient has only been taking her regular dose of metoprolol, however she has been taking her chlorthalidone. Blood pressure in the office today was 146/83. The patient's primary concern is mentioned above his cough, this has been ongoing since her visit with Dr. Onalee Hua back in April. The patient relates a history of pneumonia back in April treated with antibiotics, and has seen an improvement in her sputum color from dark yellow to clear since that time. The patient currently denies any fevers or chills, but states that the cough is worse when she drinks, as well as when she lays down at night.  Review of Systems  Constitutional: Negative for fever and chills.  Respiratory: Positive for cough. Negative for shortness of breath.   Cardiovascular: Negative for chest pain and palpitations.  Gastrointestinal: Negative for vomiting, diarrhea and constipation.       Objective:   Physical Exam  Constitutional: She appears well-developed and well-nourished.  HENT:  Head: Normocephalic and atraumatic.  Eyes: Pupils are equal, round, and reactive to light.  Cardiovascular: Normal rate, regular rhythm and intact distal pulses.  Exam reveals no gallop and no friction rub.   No murmur heard. Pulmonary/Chest: Effort normal and breath sounds normal. She has no wheezes. She has no rales.  Abdominal:  Soft. Bowel sounds are normal. She exhibits no distension. There is no tenderness.  Musculoskeletal: Normal range of motion.  Neurological: She is alert. No cranial nerve deficit.  Skin: No rash noted.         Current Outpatient Prescriptions on File Prior to Visit  Medication Sig Dispense Refill  . albuterol (PROVENTIL,VENTOLIN) 90 MCG/ACT inhaler Inhale 2 puffs into the lungs every 4 (four) hours as needed.        Marland Kitchen amitriptyline (ELAVIL) 50 MG tablet Take 50 mg by mouth at bedtime.        . brimonidine (ALPHAGAN P) 0.1 % SOLN        . cetirizine-pseudoephedrine (ZYRTEC-D) 5-120 MG per tablet Take 1 tablet by mouth 2 (two) times daily.        . chlorthalidone (HYGROTON) 25 MG tablet Take 1 tablet (25 mg total) by mouth daily.  30 tablet  11  . cloNIDine (CATAPRES) 0.1 MG tablet Take 1 tablet (0.1 mg total) by mouth 2 (two) times daily.  60 tablet  3  . colchicine 0.6 MG tablet Take 1 tablet (0.6 mg total) by mouth 2 (two) times daily as needed.  60 tablet  0  . FLUoxetine (PROZAC) 20 MG capsule Take 20 mg by mouth daily.        . fluticasone (FLONASE) 50 MCG/ACT nasal spray 2 sprays by Nasal route daily.  16 g  12  . metoprolol (LOPRESSOR) 100 MG tablet Take 1.5 tablets (150 mg total) by mouth 2 (two) times daily.  90 tablet  11  . omeprazole (  PRILOSEC) 40 MG capsule Take 1 capsule (40 mg total) by mouth 2 (two) times daily.  62 capsule  5  . PROAIR HFA 108 (90 BASE) MCG/ACT inhaler       . spironolactone (ALDACTONE) 50 MG tablet Take 1/2 tablet by mouth daily  30 tablet  3  . timolol (TIMOPTIC) 0.25 % ophthalmic solution 1 drop 2 (two) times daily.        Marland Kitchen DISCONTD: metoprolol (LOPRESSOR) 100 MG tablet Take 1 tablet (100 mg total) by mouth 2 (two) times daily.  60 tablet  3    Past Medical History  Diagnosis Date  . Depression     Controlled with SSRI  . HTN (hypertension)   . GERD (gastroesophageal reflux disease)     Chronic PPI's  . S/P dilatation of esophageal stricture      12/09 Dr Leone Payor. Dilation of tight stricture proximal eso.  Prior dilation 2/08.  . Seasonal allergies     Zyrtec D PRN  . NASH (nonalcoholic steatohepatitis)     Per [place] 10/10  . History of PSVT (paroxysmal supraventricular tachycardia)     Remote and self limited.  . Gout     No crystal dx. R podegra x 3 episodes around 2010. Did not take the allopurinol that was rec 2011.  Marland Kitchen LGSIL (low grade squamous intraepithelial lesion) on Pap smear 02/06/07    HPV Gyn 8/08 negative    Assessment & Plan:

## 2010-12-05 NOTE — Assessment & Plan Note (Signed)
The etiology of the patient's cough is likely post viral/post pneumonia versus aspiration. I think the latter is less likely, however I will check a modified barium study given the patient's history of esophageal disease if the barium study demonstrate any microaspiration, I will plan to refer her back to GI. At this point in time, I will treat symptomatically with Tessalon.

## 2010-12-05 NOTE — Telephone Encounter (Signed)
Call from pt said that she is very congested.  Coughing a lot.  Coughing up clear to white mucous.  Has no fevers.  Pt said that she has wheezing after she coughs.  Pt said that she has had these symptoms for a month and it is getting worse.  Pt was given an appointment for this am.

## 2010-12-05 NOTE — Patient Instructions (Signed)
I am changing your metoprolol back to 100 mg twice daily since her blood pressure has improved. Please return in 2-3 weeks for blood work and to followup on her blood pressure. I am going to do a swallow study to ensure that you are not aspirating fluid, and to evaluate your esophagus.

## 2010-12-05 NOTE — Assessment & Plan Note (Signed)
This has significantly improved from her prior, only on chlorthalidone. Given that this was only started a few weeks ago, we will likely continue to see improved affect. As such I have decreased the patient's Toprol back to 100 twice a day, with a plan for the patient to return in 2-3 weeks for repeat blood pressure check as well as a BMET.

## 2010-12-06 ENCOUNTER — Other Ambulatory Visit (HOSPITAL_COMMUNITY): Payer: Self-pay | Admitting: Internal Medicine

## 2010-12-06 NOTE — Progress Notes (Signed)
Message left on home phone ID recording appt [facility] 12/13/10 11AM for swallowing test - order faxed. Stanton Kidney Keean Wilmeth RN  12/06/10 4PM

## 2010-12-07 ENCOUNTER — Ambulatory Visit (HOSPITAL_COMMUNITY): Payer: Medicare PPO

## 2010-12-13 ENCOUNTER — Encounter (HOSPITAL_COMMUNITY): Payer: Medicare PPO

## 2010-12-13 ENCOUNTER — Ambulatory Visit: Payer: Medicare PPO | Admitting: Internal Medicine

## 2010-12-13 ENCOUNTER — Ambulatory Visit (HOSPITAL_COMMUNITY)
Admission: RE | Admit: 2010-12-13 | Discharge: 2010-12-13 | Disposition: A | Discharge status: Home / Self Care | Payer: Medicare PPO | Source: Ambulatory Visit | Attending: Internal Medicine | Admitting: Internal Medicine

## 2010-12-13 ENCOUNTER — Telehealth: Payer: Self-pay | Admitting: *Deleted

## 2010-12-13 DIAGNOSIS — R1310 Dysphagia, unspecified: Secondary | ICD-10-CM | POA: Insufficient documentation

## 2010-12-13 NOTE — Telephone Encounter (Signed)
[facility] made awared of continuing Metoprolol 100mg  BID. I will send message to front desk for appt to re-check BP.

## 2010-12-13 NOTE — Telephone Encounter (Signed)
Toprol XL was D/C'd bc pt couldn't swallow the large pills. Dr Cathey Endow documented "I will plan to increase the patient's Lopressor to 150 mg twice a day as she does not wish to go back on the extended release Lopressor due to her esophageal dysmotility." However, it appears 100 BID was sent in not the 150 BID. When I saw pt, she had no clue as to what she was taking.  Continue metoprolol 100 BID.  Have pt come in for BP check within 2 weeks. We will adjust then. Thanks

## 2010-12-13 NOTE — Telephone Encounter (Signed)
[facility] requests clarification.  They received a new rx for Metoprolol Tartrate 100mg  BID; but stated pt has been taking Metoprolol Succinate 200mg  QD. They wants to know if they should dispense tartrate or succinate and current dosing. Thanks

## 2010-12-14 ENCOUNTER — Other Ambulatory Visit (HOSPITAL_COMMUNITY): Payer: Self-pay | Admitting: Internal Medicine

## 2010-12-14 NOTE — Telephone Encounter (Signed)
Appt has been scheduled 12/20/10.

## 2010-12-20 ENCOUNTER — Encounter: Payer: Self-pay | Admitting: Internal Medicine

## 2010-12-20 ENCOUNTER — Ambulatory Visit (INDEPENDENT_AMBULATORY_CARE_PROVIDER_SITE_OTHER): Payer: Medicare PPO | Admitting: Internal Medicine

## 2010-12-20 DIAGNOSIS — M79672 Pain in left foot: Secondary | ICD-10-CM

## 2010-12-20 DIAGNOSIS — K219 Gastro-esophageal reflux disease without esophagitis: Secondary | ICD-10-CM

## 2010-12-20 DIAGNOSIS — IMO0002 Reserved for concepts with insufficient information to code with codable children: Secondary | ICD-10-CM

## 2010-12-20 DIAGNOSIS — J301 Allergic rhinitis due to pollen: Secondary | ICD-10-CM

## 2010-12-20 DIAGNOSIS — M109 Gout, unspecified: Secondary | ICD-10-CM

## 2010-12-20 DIAGNOSIS — M79609 Pain in unspecified limb: Secondary | ICD-10-CM

## 2010-12-20 DIAGNOSIS — I10 Essential (primary) hypertension: Secondary | ICD-10-CM

## 2010-12-20 DIAGNOSIS — R87612 Low grade squamous intraepithelial lesion on cytologic smear of cervix (LGSIL): Secondary | ICD-10-CM

## 2010-12-20 DIAGNOSIS — Z0000 Encounter for general adult medical examination without abnormal findings: Secondary | ICD-10-CM

## 2010-12-20 DIAGNOSIS — K222 Esophageal obstruction: Secondary | ICD-10-CM

## 2010-12-20 DIAGNOSIS — R05 Cough: Secondary | ICD-10-CM

## 2010-12-20 MED ORDER — SPIRONOLACTONE 50 MG PO TABS: ORAL_TABLET | ORAL | Status: DC

## 2010-12-20 MED ORDER — BENZONATATE 100 MG PO CAPS: 100.0000 mg | ORAL_CAPSULE | Freq: Three times a day (TID) | ORAL | Status: DC | PRN

## 2010-12-20 MED ORDER — AMITRIPTYLINE HCL 50 MG PO TABS: 50.0000 mg | ORAL_TABLET | Freq: Every day | ORAL | Status: DC

## 2010-12-20 MED ORDER — OMEPRAZOLE 40 MG PO CPDR: 40.0000 mg | DELAYED_RELEASE_CAPSULE | Freq: Two times a day (BID) | ORAL | Status: DC

## 2010-12-20 MED ORDER — ALBUTEROL 90 MCG/ACT IN AERS: 2.0000 | INHALATION_SPRAY | RESPIRATORY_TRACT | Status: DC | PRN

## 2010-12-20 MED ORDER — FLUTICASONE PROPIONATE 50 MCG/ACT NA SUSP: 2.0000 | Freq: Every day | NASAL | Status: DC

## 2010-12-20 MED ORDER — CLONIDINE HCL 0.1 MG PO TABS: 0.1000 mg | ORAL_TABLET | Freq: Two times a day (BID) | ORAL | Status: DC

## 2010-12-20 MED ORDER — CHLORTHALIDONE 25 MG PO TABS: 25.0000 mg | ORAL_TABLET | Freq: Every day | ORAL | Status: DC

## 2010-12-20 MED ORDER — COLCHICINE 0.6 MG PO TABS: 0.6000 mg | ORAL_TABLET | Freq: Two times a day (BID) | ORAL | Status: DC | PRN

## 2010-12-20 MED ORDER — CETIRIZINE-PSEUDOEPHEDRINE ER 5-120 MG PO TB12: 1.0000 | ORAL_TABLET | Freq: Every day | ORAL | Status: DC

## 2010-12-20 MED ORDER — FLUOXETINE HCL 20 MG PO CAPS: 20.0000 mg | ORAL_CAPSULE | Freq: Every day | ORAL | Status: DC

## 2010-12-20 MED ORDER — METOPROLOL TARTRATE 100 MG PO TABS: 100.0000 mg | ORAL_TABLET | Freq: Two times a day (BID) | ORAL | Status: DC

## 2010-12-20 NOTE — Discharge Summary (Signed)
NAME:  Heather [name], Heather [name]          ACCOUNT NO.:  [id number]   MEDICAL RECORD NO.:  [id number]          PATIENT TYPE:  INP   LOCATION:                                FACILITY:  [facility]   PHYSICIAN:  Ginger Carne, MD  DATE OF BIRTH:  [birth year]   DATE OF ADMISSION:  04/02/2007  DATE OF DISCHARGE:  04/05/2007                               DISCHARGE SUMMARY   REASON FOR HOSPITALIZATION:  Third degree cystocele with partial cuff  prolapse, foreshortened vagina and genuine urinary stress incontinence.   IN-HOSPITAL PROCEDURES:  Attempted laparoscopic colpotomy with  inadvertent cystotomy, followed by laparotomy bladder repair with  paravaginal repair and Burch colposuspension.  Cystoscopy placement of a  right double-J ureteral stent and bladder repair x3.   FINAL DIAGNOSIS:  Third degree cystocele, partial cuff prolapse, genuine  urinary stress incontinence and inadvertent cystotomy.   HOSPITAL COURSE:  This patient is a 70 year old African-American female  who was taken to the operating room on April 02, 2007 for a  laparoscopic cuff colpotomy followed by a TVT and anterior colporrhaphy  with cystoscopy, due to the aforementioned findings.  During the course  of the colpotomy, despite precautions to avoid a cystotomy, the bladder  was entered.  It was deemed that the bladder, despite filling and  careful identification of the top of the vaginal cuff with a rectal  probe, was invariably found to result in said cystotomy.  At that time,  it was noted that there was a low posterior laceration of the bladder  wall approximately 1-2 cm, followed by another laceration below the  right ureteral meatus.  This had punctured the vaginal wall on that same  side.  Dr. Lorretta Harp  requested for a urological consultation  intraoperatively.  The aforementioned procedures were performed as  described above.  The reader is referred to the operative notes, both by  Dr. Lorretta Harp and myself.  The  patient was under anesthesia for  approximately 6 to 7 hours.  Following her surgery, the patient remained  afebrile.  Her bladder was amber colored but was lightening at the time  of discharge.  Her abdomen was soft.  Her incision was dry, calves  without tenderness and lungs were clear.  Her hemoglobin had dropped to  7.4 a few days postoperatively, and she received 3 units of packed red  blood cells, which resulted in a final hemoglobin value of 10.8.  Her  creatinine on postoperative day #1 was 0.83.  It was deemed that her  blood loss was between 600 and 700 mL during surgery, but due to  hemodilution from prolonged surgery and intravenous fluids, her  hemoglobin and hematocrit had dropped as a consequence.  Her abdomen was  soft and demonstrated no sign of intra-abdominal bleeding, and her vital  signs were stable.  In consultation with Dr. Lorretta Harp, it was agreed  that the patient will follow up in 3 weeks for a urogram, and decision  made to remove the double-J stent of the right ureter.  It should be  noted the ureter was not injured at any point, along it's course on the  right side.  However, due to the proximity of the bladder repair near  the right ureteral orifice, it was deemed appropriate to prevent  possible scarring of the external meatus.  She will therefore be seen in  his office on April 23, 2007.  Her staples will be removed at the  GYN clinic at [facility] on September 5.   MEDICATIONS:  The patient's medications on discharge include:  1. Percocet 5/325 1 to 2 every 4-6 hours for pain.  2. Ciprofloxacin 250 mg twice a day for 3 weeks.  3. She will continue Toprol XL 100 mg twice daily.  4. Maxzide 25 mg one daily.  5. Loratadine 10 mg daily.  6. Amitriptyline 50 mg at bedtime.  7. Catapres 0.2 mg twice a day.  8. One multivitamin daily.  9. Nexium 40 mg daily.  10.Norvasc 5 mg daily.  11.Fluoxetine 20 mg daily.  12.Timoptic Ocudose eye drops and  Alphagan eye drops twice a day.   The patient was instructed to contact the office for a temperature  elevation above 1.4 degrees Fahrenheit, increasing abdominal pain,  incisional drainage, pain or erythema, vaginal drainage, frank blood in  the Foley catheter, pulmonary GI or GU symptomatology.  The patient was  advised not to do heavy lifting for the next 6 weeks.  She was also  advised to avoid intercourse as well.  She will be sent home with a leg  bag and Foley catheter for nighttime use for comfort.  All questions  answered to the satisfaction of said patient, and the patient verbalized  understanding of same.      Ginger Carne, MD  Electronically Signed     SHB/MEDQ  D:  04/05/2007  T:  04/06/2007  Job:  [id number]   cc:   [place] [facility], M.D.  Fax: [phone]

## 2010-12-20 NOTE — Group Therapy Note (Signed)
NAME:  Heather [name], Heather [name] NO.:  [id number]   MEDICAL RECORD NO.:  [id number]          PATIENT TYPE:  WOC   LOCATION:  [facility]                   FACILITY:  [facility]   PHYSICIAN:  Elsie Lincoln, MD      DATE OF BIRTH:  14-Jul-1941   DATE OF SERVICE:  03/27/2007                                  CLINIC NOTE   The patient is a 70 year old female who presents for abnormal Pap smear.  She had a low grade in 2006,  HPV negative.  She is repeat with low  grade.  The reason why we are still doing Pap smears on this patient  post hysterectomy is secondary to abnormal Pap smear prior to  hysterectomy.  She does not know why she had the hysterectomy but says  precancerous cells, however, they could be of the uterus because they  did scrape before.  We are going to get the notes from Dr. Stefano Gaul to  see exactly what happened so we can update the patient's chart  accordingly.  Also note the patient has a large cystocele and she is  complaining of urinary incontinence.  Dr. Mia Creek has her scheduled for  a procedure next week to take care of these problems.  I did call him  and told him about the low grade Pap smear and he will be also looking  for the HPV typing.   On physical exam, of note, her vagina is extremely short and veers to  the right.  She does have a large cystocele as Dr. Mia Creek noted.  She  is not sexually active and I would find it to be very hard for her to be  sexually active given the length of her vagina and the pain she has with  a simple speculum exam.  Hopefully HPV typing will be negative and we  can get her chart from Dr. Stefano Gaul to really figure out what is going  on with this patient's history.  The patient is to follow up next week  with Dr. Mia Creek.           ______________________________  Elsie Lincoln, MD     KL/MEDQ  D:  03/27/2007  T:  03/28/2007  Job:  [id number]

## 2010-12-20 NOTE — Group Therapy Note (Signed)
NAME:  Heather [name], HAUSS NO.:  [id number]   MEDICAL RECORD NO.:  [id number]          PATIENT TYPE:  WOC   LOCATION:  [facility]                   FACILITY:  [facility]   PHYSICIAN:  Ginger Carne, MD DATE OF BIRTH:  1940/08/09   DATE OF SERVICE:  05/03/2007                                  CLINIC NOTE   [name] is here for followup today following having a paravaginal  repair and verge colposuspension on 26 August, 2008.  She had a  inadvertent cystotomy, which resulted in a repair.  Dr. Andee Poles  assisted in said repair and her right ureter was stented although there  was no direct injury to same.  The patient was seen last week by Dr.  Andee Poles who had remove said stent.  She today complains of no urinary  stress incontinence, holds her urine well, and has no complaints of  vaginal bulging.   PELVIC EXAM:  External genitalia, vulva, vagina reveals good support of  the anterior and posterior compartment with good cuff support and the  vagina is 6 cm in depth.  There is no evidence of leakage of fluid from  the vagina and no complaint of same by the patient.  Her incision is  healing well on her abdomen.   According to Dr. Cindie Laroche note, he will see her back in about 3 weeks  for followup, but otherwise she is doing well and satisfied with her  surgery.  I asked her at this point to return on a p.r.n. basis.           ______________________________  Ginger Carne, MD     SHB/MEDQ  D:  05/03/2007  T:  05/03/2007  Job:  [id number]

## 2010-12-20 NOTE — Op Note (Signed)
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   PROCEDURE:  Attempted laparoscopic colpotomy, laparotomy with bladder  repair (please see Dr. Demetrios Loll dictation), paravaginal repair, Burch  colposuspension.   SURGEON:  Ginger Carne, MD   ASSISTANT:  Johnella Moloney, MD.   ESTIMATED BLOOD LOSS:  600-700 mL.   COMPLICATIONS:  Bladder cystotomy.   ANESTHESIA:  General.   SPECIMEN:  None.   OPERATIVE FINDINGS:  The patient had a third degree cystocele with  partial cuff prolapse and a foreshortened vagina from a prior total  vaginal hysterectomy.  The patient had a first-degree rectocele.  Attempt laparoscopically at performing a colpotomy through the vaginal  cuff was facilitated by filling the bladder with 200 cc of NS and  assuring I was below the most dependent aspect of the bladder.  Despite  such precautions and efforts, a inadvertent cystotomy was performed in  the most dependent portion of the bladder as identified by cystoscopy.  It appeared that the most posterior dependent portion of the bladder had  wrapped around the foreshortened vaginal cuff. At this point attention  was directed to repair and Dr. Wanda Plump was notified.  The procedure  will be described below. The anterior colporrhaphy and a TVT with  cystoscopy was changed to a paravaginal repair and a Burch  colposuspension. The change in  planned procedure was necessary due to  significant adhesive disease in the pelvis (post-operative or prior old  pid) and bladder repair.   OPERATIVE PROCEDURE:  The patient prepped and draped in usual fashion  and placed in lithotomy position.  Betadine solution used for antiseptic  and the patient was catheterized prior to procedure.  After adequate  general anesthesia, a palmar point incision was made.  Veress needle  placed in the abdomen.  Opening closing pressures were 10 and 15 mmHg.  Needle release trocar placed in same incision.  Laparoscope placed the  trocar sleeve.  Afterwards two 5 mm ports were made under direct  visualization in left lower quadrant and left hypogastric regions.  The  bladder was filled to about 150-200 mL and was elevated with a rectal  probe placed into the cuff to facilitate identification of the top of  the cuff to perform a colpotomy.  Adhesions were lysed in the pelvis and  around the vaginal cuff. Despite such safe guards, the bladder was  entered and the procedure was discontinued.  Dr. Wanda Plump was notified.  The patient reprepped and draped. The 10-mm fascia site was closed with  2-0 Vicryl suture and 4-0 Vicryl for subcuticular  closures with  Dermabond.  Following this a Pfannenstiel incision was made.  The  abdomen opened.  Self-retaining retractors were utilized.  The dome of  the bladder was opened after instilling normal saline in the bladder as  described in Dr. Demetrios Loll note.  The left ureter was intact and the  right ureter had a stent placed with a double J to follow.  The repair  is dictated under Dr. Demetrios Loll notes.  Two lacerations were noted one  in the most dependent portion of the bladder posteriorly where the  attempted laparoscopic dissection was initiated and below the right  ureteral orifice, although the ureter was not injured.  The repair is  dictated under Dr. Demetrios Loll note.   The paravaginal repair was performed  along with a Burch colposuspension  by dissecting the space of Retzius and a series of 0 Prolene interrupted  sutures were placed 1-2 cm lateral to the urethra and affixed to the  lacunar ligaments without entered undue tension on either side and a  series of 3-0 Prolene was affixed to the white line to continue the  repair for correction of the cystocele.  Bleeding points hemostatically  checked.  Blood clots removed.  Copious irrigation lactated Ringer's  followed, irrigant removed.  No active bleeding noted.  Closure of the  fascia with PDS double loop 0 running interlocking running suture and  skin staples for the skin.  Instrument and sponge count were correct.  The patient tolerated the procedure well, returned to the post  anesthesia recovery room in excellent condition.      Ginger Carne, MD  Electronically Signed     SHB/MEDQ  D:  04/02/2007  T:  04/03/2007  Job:  [id number]

## 2010-12-20 NOTE — Group Therapy Note (Signed)
NAME:  Heather [name], Heather [name] NO.:  [id number]   MEDICAL RECORD NO.:  [id number]          PATIENT TYPE:  WOC   LOCATION:  [facility]                   FACILITY:  [facility]   PHYSICIAN:  Ginger Carne, MD DATE OF BIRTH:  [birth year]   DATE OF SERVICE:  02/06/2007                                  CLINIC NOTE   This patient is presenting with a history of genuine urinary stress  incontinence, urgency and vaginal pressure.  She has had the same  symptoms over the past several years and has worsened in the past 1  year.  She has stated that over time her symptoms have worsened to where  she is unable to perform her regular duties without complication.   PAST MEDICAL HISTORY:  Her medical history is complicated by  hypertension with polypharmacy, glaucoma and GERD.  She has not had  chest pain over the past several years.  Medication list is extensive  and the current medication list on the chart should be reviewed.   PHYSICAL FINDINGS:  EXTERNAL GENITALIA:  Vulva and vagina reveal a third  degree cystocele with second-degree uterovaginal prolapse without a  rectocele.  A disarticulated speculum was utilized for evaluation.  The  patient has positive incontinence sign on Valsalva maneuver. Uterus  small, anteverted and flexed.  Both adnexa palpable and found to be  normal.  Rectovaginal exam confirmatory.  Perineum intact.   IMPRESSION AND PLAN:  I think the patient requires a cardiology  evaluation to ensure there is no occult or underlying cardiovascular  disease.  I have scheduled out to the end of August for a tension-free  vaginal tape procedure, cystoscopy and total vaginal hysterectomy with  uterosacral ligament vaginal vault suspension. The nature of said  procedure discussed in detail and patient will be followed up here in  the clinic prior to surgery to ensure that her cardiology consultation  is complete and that she is still a candidate for surgery.     ______________________________  Ginger Carne, MD     SHB/MEDQ  D:  02/06/2007  T:  02/07/2007  Job:  [id number]

## 2010-12-20 NOTE — Progress Notes (Signed)
  Subjective:    Patient ID: Heather [name], female    DOB: May 02, 1941, 70 y.o.   MRN: [id number]  HPI  Please see the A&P for the status of the pt's chronic medical problems.   Review of Systems  Constitutional: Negative for unexpected weight change.  Respiratory: Positive for cough. Negative for shortness of breath.   Cardiovascular: Positive for leg swelling.  Musculoskeletal: Positive for joint swelling and gait problem. Negative for myalgias.  Skin: Positive for wound.       Ecchymosis L great toe.   Hematological: Does not bruise/bleed easily.       Objective:   Physical Exam  Constitutional: She is oriented to person, place, and time. She appears well-developed and well-nourished. No distress.  HENT:  Head: Normocephalic and atraumatic.  Right Ear: External ear normal.  Left Ear: External ear normal.  Nose: Nose normal.  Eyes: Conjunctivae are normal.  Musculoskeletal: She exhibits edema and tenderness.       Ecchymosis L great toe. Pitting edema B lower ext to knees. Also pitting edema over dorsum of L foot. Able to weight bear and load bear the metatarsal heads. Point tenderness over fourth metatarsal Tenderness to movement of great toe  Neurological: She is alert and oriented to person, place, and time.  Skin: Skin is warm and dry. She is not diaphoretic.  Psychiatric: She has a normal mood and affect. Her behavior is normal. Judgment and thought content normal.          Assessment & Plan:

## 2010-12-20 NOTE — Op Note (Signed)
NAME:  Heather [name], Heather [name]          ACCOUNT NO.:  [id number]   MEDICAL RECORD NO.:  [id number]          PATIENT TYPE:  INP   LOCATION:  9318                          [facility]ACILITY:  [facility]   PHYSICIAN:  Boston Service, M.D.DATE OF BIRTH:  Apr 21, 1941   DATE OF PROCEDURE:  04/02/2007  DATE OF DISCHARGE:  04/05/2007                               OPERATIVE REPORT   PREOPERATIVE DIAGNOSIS:  Cystocele, vaginal vault prolapse, bladder  cystotomy.   POSTOPERATIVE DIAGNOSIS:  Cystocele, vaginal vault prolapse, bladder  cystotomy.   PROCEDURE:  Cystoscopy, left ureteral stent placement, right double J  stent placement, repair of bladder cystotomy x3.   ESTIMATED BLOOD LOSS:  600-700 mL.   ANESTHESIA:  General.   SPECIMENS:  None.   DESCRIPTION OF PROCEDURE:  Dr. Mia Creek will dictate his portion of the  procedure.  During the course of laparoscopic repair of vaginal vault  prolapse and cystocele, cystotomy occurred on the right side of the  bladder.  Urology consultation was sought.   At the time I entered the room, the bladder dome had been entered in  order to allow better visualization of the trigone.  There appeared to  be a cystotomy, both behind the right ureteral orifice and in front of  the right ureteral orifice.  The cystoscope was introduced at the  urethral meatus.  The left ureteral orifice was identified and  cannulated with a 6 French ureteral catheter confirmed on fluoroscopy to  be within the ureter.  Unable to locate the right ureteral orifice  cystoscopically.  The cystoscope was removed, careful inspection with  Dr. Mart Piggs help identified what appeared to be the right ureteral  orifice.  The peritoneum had been incised along the lateral pelvic  sidewall.  The ureter was identified as it coursed through the pelvis  and appeared to be uninjured and not dilated.  With careful attention to  the trigone, the right ureteral orifice was identified, cannulated with  the  floppy tip guide-wire, and palpated to pass through the distal  ureter into the proximal ureter.  A 6 French 24 cm double J stent was  then selected and passed over the indwelling guide-wire with what  appeared to be good pigtail formation on guide-wire removal.  Cystotomies were then repaired with ureteral stents in place.  Cystotomy  anterior to the right ureteral orifice was closed in two layers with  interrupted sutures of 2-0 Vicryl.  The cystotomy posterior to the right  ureteral orifice was repaired in two layers with interrupted sutures of  2-0 Vicryl and the cystotomy along the dome was  closed in two layers of 3-0 Vicryl.  The bladder was demonstrated to be  watertight.  An 32 French silicone catheter was left to straight drain  as the patient apparently had a latex allergy.  The plan at this point  is for cystogram in about 2-3 weeks to confirm bladder healing and  double J stent removal after the Foley catheter comes out.           ______________________________  [place] Service, M.D.     RH/MEDQ  D:  04/17/2007  T:  04/17/2007  Job:  [id number]   cc:   Ginger Carne, MD

## 2010-12-20 NOTE — Assessment & Plan Note (Addendum)
Her blood pressure is excellent today.  I am concerned bc pt doesn't know her meds, didn't bring her meds, and when asked what her medicines were she handed me her AVS from her last visit. And it appears that she refilled her Norvasc even after it was stopped because it was believed to be playing a role in her lower extremity edema. She also refilled her metoprolol ER even though she had been switched to immediate release because the ER pills were too big and she had trouble swallowing them. So I have no way of knowing if pills she is actually swallowing on a given day.  I continue to request that she bring in her pill bottles so that I know what she's taking.  She requested refills of all of her medicines [facility]. I used this opportunity to only send in the medications that she is supposed to be taking.

## 2010-12-20 NOTE — Discharge Summary (Signed)
NAME:  Heather [name], Heather [name]          ACCOUNT NO.:  [id number]   MEDICAL RECORD NO.:  [id number]          PATIENT TYPE:  INP   LOCATION:                                [facility]   PHYSICIAN:  Ginger Carne, MD  DATE OF BIRTH:  May 15, 1941   DATE OF ADMISSION:  04/12/2007  DATE OF DISCHARGE:  04/19/2007                               DISCHARGE SUMMARY   REASON FOR HOSPITALIZATION:  Postoperative abdominal pain.   IN-HOSPITAL PROCEDURES:  1. CT scan with contrast of abdomen and pelvis.  2. Two position abdominal x-ray used.   FINAL DIAGNOSIS:  Postoperative ileus and/or resolved partial small  bowel obstruction.   IN-HOSPITAL CONSULTS:  Dr. Lanelle Bal COURSE:  This patient is a 70 year old black female who  underwent paravaginal repair, Burch colposuspension, and repair of  bladder injury and placement of a double-J right ureter stent by Dr.  Wanda Plump on April 02, 2007.  She presented to the [facility] Emergency  Department on the 8th of September 2008 because of lower abdominal pain.  She was initially admitted to the internal medicine teaching service and  transferred to [facility] for postoperative management.  At the  time of her admission, her abdomen was distended with minimal bowel  sounds.  The patient is a chronic laxative user and has significant  issues with constipation over the years.  The initial CT evaluation with  contrast on the 8th of September was limited because follow-through  films were not obtained.  On the 9th of September, a followup CT of the  abdomen with contrast of abdomen and pelvis including delayed films  demonstrated increased dilation of proximal and mid small bowel with a  transition to a nondilated distal small bowel loop.  There was evidence  of dye entering the colon.  The urinary tract demonstrated no evidence  for hydronephrosis and the double-J stent in the right ureter was in  place.  The patient was managed with an NG  tube which had minimal  drainage for 3 days.  In addition, Dr. Colin Benton from the department of  surgery was consulted.  She agreed that although it is late for  postoperative ileus, the patient does not fit the findings of a true  small bowel obstruction or partial small bowel obstruction.  She  responded to magnesium citrate orally in addition to Dulcolax  suppositories.  Over the course of the next 48 hours, her abdomen had  softened.  She had increased bowel sounds, passed flatus, and  subjectively felt much better.  With this in mind, the NG tube was  removed on the day before discharge.  She tolerated the soft diet well.  On the day of her discharge, the patient had 2 bowel movements, passed  flatus.  Her abdomen was soft, good bowel sounds.  She was afebrile, and  her urine was clear with a Foley catheter in place.   ASSESSMENT/PLAN:  The patient presumably had either a resolved partial  small bowel obstruction and/or a delayed postoperative ileus.  She was  advised to not use laxatives apart from the Dulcolax suppository  and to  utilize Citrucel by tablet or powder once in the evening every day or  every other day.  Encouragement for eating fruits and vegetables and  increasing fluid intake was discussed with the patient.  She is  scheduled to see Dr. Wanda Plump next week for possible removal of her  Foley catheter and double-J stent.  In addition, she will be seen in the  GYN clinic at [facility] in about 3 weeks.  She was asked to  continue her home medications apart from narcotics (Percocet).  She was  encouraged to utilize ibuprofen or Tylenol.  She will also remain on  ciprofloxacin 250 mg twice daily, Maxzide 25 mg daily, loratadine 10 mg  daily, amitriptyline 50 mg at night, Catapres 1 twice daily 0.2 mg,  multivitamin, Nexium 40 mg daily, Norvasc 5 mg daily, fluoxetine 20 mg  daily, and 2 ophthalmologic solutions prescribed by her ophthalmologist.  She was advised to  contact the office or return to the maternity  admission unit at [facility] for temperature elevation above 100.4  degrees Fahrenheit, increasing abdominal pain, distention, failure to  have a bowel movement in 48 hours after a Dulcolax suppository, vaginal  bleeding, drainage, or any other symptomatology of concern.  Husband and  the patient were present at the time of discharge instructions.  All  questions answered to the satisfaction of said patient, and the patient  verbalized understanding of same.      Ginger Carne, MD  Electronically Signed     SHB/MEDQ  D:  04/19/2007  T:  04/19/2007  Job:  [id number]

## 2010-12-20 NOTE — Assessment & Plan Note (Signed)
[facility]                            CARDIOLOGY OFFICE NOTE   NAME:Heather [name], Heather [name]                 MRN:          [id number]  DATE:03/07/2007                            DOB:          [birth year]    OFFICE NOTE AND CARDIAC CLEARANCE NOTE:  The patient needs surgery for uterine prolapse.  There is no history of  documented coronary disease.  There is a question of slight chest  discomfort and the patient is seen for further evaluation and to help  with cardiac clearance.  I had seen Ms. Baine in the past.  There  was a history in the past of hypertension and also a short burst of  paroxysmal supraventricular tachycardia in the past.  She has not had  documented coronary disease.  There had been discussion of an exercise  test in the past and she cancelled when it was ordered.  She has been  quite stable since that time.  She has not had a need for Coumadin.   Recently she had slight chest discomfort when rotating her shoulder.  She also had some very slight discomfort from rushing to get here today.  This does not sound like ischemia.  She does not have PND or orthopnea.   PAST MEDICAL HISTORY:  Allergies:  DARVON and SHELLFISH.   Medications:  1. Nexium.  2. Amitriptyline.  3. Fluoxetine.  4. Clonidine 0.2 mg daily.  5. Aspirin 325 mg,  6. Triamterine/hydrochlorothiazide.  7. Folic acid.  8. Eye drops.  9. Amlodipine.  10.Toprol 200 mg.  11.Claritin.  12.Multivitamin.   Other medical problems:  See the list below.   REVIEW OF SYSTEMS:  Today she feels fine.  As mentioned, there was  question of very slight chest tightness at one point.  This does not  sound like angina.  She is bothered by urinary incontinence and she  will, hopefully, benefit from the surgery recommended.   Otherwise her review of systems is negative.   PHYSICAL EXAM:  Blood pressure is 124/68.  Her pulse is 73.  Weight is  211 pounds.  The patient is  oriented to person, time and place.  Affect is normal.  HEENT:  No xanthelasma.  She has normal extraocular motion.  There are no carotid bruits.  There is no jugular venous distention.  LUNGS:  Clear.  Respiratory effort is not labored.  CARDIAC:  An S1 with an S2.  There are no clicks or significant murmurs.  ABDOMEN:  Soft.  There are no masses or bruits.  There is no significant peripheral edema.   EKG reveals mild nonspecific ST-T wave changes.   PROBLEMS:  1. Hypertension, treated.  2. History of a short burst of paroxysmal supraventricular tachycardia      in the remote past.  3. Vague intermittent chest pain that does not appear to be cardiac in      origin.  4. Gastroesophageal reflux disease, on Nexium.   I believe the patient is stable from a cardiac viewpoint.  I am  recommending no further cardiac workup at this time.  She is cleared for  surgery  from a cardiac viewpoint.     Luis Abed, MD, [facility]  Electronically Signed    JDK/MedQ  DD: 03/07/2007  DT: 03/08/2007  Job #: [id number]   cc:   Ginger Carne, MD  [facility]

## 2010-12-21 ENCOUNTER — Other Ambulatory Visit: Payer: Self-pay | Admitting: *Deleted

## 2010-12-21 DIAGNOSIS — M79672 Pain in left foot: Secondary | ICD-10-CM | POA: Insufficient documentation

## 2010-12-21 LAB — BASIC METABOLIC PANEL WITH GFR
BUN: 12 mg/dL (ref 6–23)
Calcium: 9 mg/dL (ref 8.4–10.5)
Chloride: 103 mEq/L (ref 96–112)
Creat: 0.58 mg/dL (ref 0.40–1.20)
GFR, Est African American: >60 mL/min (ref >60)
GFR, Est Non African American: >60 mL/min (ref >60)
Potassium: 4.1 mEq/L (ref 3.5–5.3)
Sodium: 138 mEq/L (ref 135–145)

## 2010-12-21 NOTE — Assessment & Plan Note (Signed)
She is asymptomatic on her proton pump inhibitor

## 2010-12-21 NOTE — Assessment & Plan Note (Signed)
Patient states that her last attempted Pap smear was about 3 years ago in the internal medicine clinic and it was unsuccessful. She states that she had complications after her birch procedure and the speculum was not able to be introduced and advanced in the vaginal vault. I offered to refer her to gynecology but we do not agree up on a plan today.

## 2010-12-21 NOTE — Assessment & Plan Note (Signed)
Heather [name] recently had a swallowing evaluation. The swallowing evaluation did not demonstrate any aspiration but they are convinced she is aspirating. They gave her tips to help her swallow. They also recommended a GI consult for reflux management and to discuss whether EGD and dilation would indicated. Heather [name] is on a proton pump inhibitor and has been on one for years. She currently has no trouble swallowing her pills. If she doesn't chew her food enough or takes too much food at once then she does have trouble swallowing. She has no symptoms of GERD or heartburn.   She states that her current symptoms are very similar to her symptoms in the past when she had to have a dilation of her esophagus. She also stated that the dilation did improve her swallowing. I am going to continue her proton pump inhibitor. Since she continues to have some dysphasia I will refer her back to GI.

## 2010-12-21 NOTE — Assessment & Plan Note (Signed)
She continues to have a dry cough. She uses Tessalon pearls about twice a day and has not attempted to come off. It seems that this started after a reported pneumonia. However there are no chest x-rays in the system recently that indicate she had a pneumonia. There was also no discharge note. I believe I saw a note that said she was diagnosed with pneumonia at the urgent care Center. It was a pneumonia that led to the swallowing evaluation.   I will need to do a chest x-ray at the next visit if she continues to have a cough to make certain that they're reported pneumonia has resolved and also to workup a chronic cough. She was not interested in pursuing additional x-rays today since she has had so many x-rays recently.

## 2010-12-21 NOTE — Assessment & Plan Note (Signed)
We'll need to discuss Pap smear and the Zostavax at her next visit. She is over the age of 33 but her most recent Pap smear was abnormal so I do think she needs additional screening. Otherwise she is up to date.

## 2010-12-23 NOTE — Assessment & Plan Note (Signed)
[facility]                         GASTROENTEROLOGY OFFICE NOTE   NAME:[name], [name]                 MRN:          [id number]  DATE:09/17/2006                            DOB:          [birth year]    REFERRING PHYSICIAN:  Peggye Pitt, M.D.   CHIEF COMPLAINT:  Colon cancer screening, dysphagia.   ASSESSMENT:  Chronic solid food dysphagia.  The patient has a known  problem with esophageal dysmotility seen on barium swallow in 2006, as  well as what the radiologist called cricopharyngeal achalasia.  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  She also is of appropriate age for colon cancer screening  and has no significant colonic symptoms.   PLAN:  1. Schedule upper GI endoscopy and dilation of the esophageus with      either Maloney or Savary dilator most likely.  Risks, benefits, and      indications are reviewed.  2. Schedule screening colonoscopy.  Risks, benefits, and indications      were reviewed.  3. Depending upon the response to the dilation, esophageal monometry      and/or repeat barium swallow could be indicated to reassess.      Further plans pending the above.   HISTORY:  This is a pleasant 70 year old African-American woman has had  problems with dysphagia for years.  She had an upper GI endoscopy in  2002 by Dr. Loreta Ave, which was essentially unrevealing.  She was advised to  try antispasmodics.  Subsequently, in September of 2006, Dr. Bosie Clos  performed 1, which raised the question of dysmotility.  A barium  swallow, as described above, was subsequently performed.  The patient  describes intermittent solid food dysphagia at least twice in the last  year or 2.  She has had to have somebody help her dislodge an impaction  with a Heimlich-type maneuver.  She does not seem to have much liquid  dysphagia.  There  is occasional gas and bloating and constipation.  She  has some chest pain associated with these dysphagia episodes.  She feels  her weight is going up.  She also has difficulty swallowing pills  without crushing them.   All other GI review of systems negative.   MEDICATIONS:  1. Nexium 40 mg daily.  2. Toprol 100 mg b.i.d.  3. Amitriptyline 50 mg nightly  4. AllerClear daily.  5. Fluoxetine 20 mg daily.  6. Clonidine 0.2 mg daily.  7. Aspirin 325 mg daily.  8. Calcium with vitamin D daily.  9. Triamterine hydrochlorothiazide 37.5/25 mg daily.  10.Folic acid daily.  11.Brimonidine eye drops.  12.Timolol eye drops.   DRUG ALLERGIES:  DARVON, SHELLFISH.   Note, the patient does have chronic indigestion controlled by the  Nexium.   PAST MEDICAL HISTORY:  Hypertension, glaucoma, anxiety, depression,  atypical chest pain, hysterectomy, tubal ligation, cholecystectomy,  allergies.   FAMILY HISTORY:  A son has had colon polyps.  There is no colon cancer.  Father had diabetes, as well as alcoholism and alcoholism in  brothers.  Mother, father, and sister had heart problems.   SOCIAL HISTORY:  She is currently a Lawyer, unemployed.  She is widowed, 3  sons, 2 daughters.  No alcohol, tobacco, or drugs.   REVIEW OF SYSTEMS:  See medical history form for full details.   PHYSICAL EXAM:  Reveals a 70 year old black woman in no acute distress.  Height 5 feet 10 inches, weight 204 pounds, blood pressure 118/72, pulse  80.  EYES:  Anicteric.  MOUTH:  Posterior oropharynx is free of lesions.  NECK:  Supple without mass or thyromegaly.  CHEST:  Clear.  HEART:  S1, S2.  No murmurs, rubs, or gallops.  ABDOMEN:  Soft and nontender without organomegaly or mass.  Mildly  distended.  Mildly obese.  EXTREMITIES:  No peripheral edema in the lower extremities.  LYMPHATICS:  No neck or supraclavicular nodes.  SKIN:  Warm and dry.  No acute rash in the areas inspected.  NEURO/PSYCH:  She is alert  and oriented x3.   I appreciate the opportunity to care for this patient.     Iva Boop, MD,FACG  Electronically Signed    CEG/MedQ  DD: 09/17/2006  DT: 09/17/2006  Job #: [id number]   cc:   Peggye Pitt, M.D.

## 2010-12-23 NOTE — Procedures (Signed)
[place]  Patient:    Heather [name], Heather [name]                 MRN: [id number] Proc. Date: 10/01/00 Adm. Date:  [id number] Attending:  Charna Elizabeth CC:         Reuben Likes, M.D.   Procedure Report  DATE OF BIRTH:  02-11-1941  PROCEDURE PERFORMED:  Esophagogastroduodenoscopy  ENDOSCOPIST:  Anselmo Rod, M.D.  INSTRUMENT USED:  Olympus video panendoscope  INDICATION FOR PROCEDURE:  A 70 year old African-American female with a history of dysphagia and abnormal upper GI series with some irregularity seen in the proximal to mid thoracic esophagus.  Esophageal dilatation is planned if needed.  The patient has dysphagia especially with large pills but not with solids or liquids.  PREPROCEDURE PREPARATION:  Informed consent was procured from the patient. The patient was fasted for eight hours prior to the procedure.  PREPROCEDURE PHYSICAL:  VITAL SIGNS:  The patient had stable vital signs.  NECK:  Supple.  CHEST:  Clear to auscultation.  S1 and S2 regular.  ABDOMEN:  Soft with normal abdominal bowel sounds.  No hepatosplenomegaly. No masses palpable.  DESCRIPTION OF PROCEDURE:  The patient was placed in the left lateral decubitus position and sedated with 30 mg of Demerol and 3 mg of Versed intravenously. Once the patient was adequately sedated and maintained on low flow oxygen and continuous cardiac monitoring, the Olympus video panendoscope was advanced through the mouth piece over the tongue into the esophagus under direct vision.  The esophagus was widely patent.  There was slight irregularity in the proximal esophagus which might be due to prominent musculature.  No stricture or definite narrowing was seen.  The adult video panendoscope passed into the stomach, to the GE junction without any hesitancy.  The GE junction mucosa around the V line appeared healthy.  There was a small hiatal hernia seen on high retroflexion.  The  entire gastric mucosa and the proximal small bowel appeared normal.  RECOMMENDATIONS: 1. The patient has been advised to try antispasmodics if her symptoms persist. 2. Continue Nexium for now. 3. Avoid all nonsteroidals. 4. Outpatient follow up in the next two to four weeks. DD:  10/01/00 TD:  10/02/00 Job: [id number]

## 2010-12-23 NOTE — Consult Note (Signed)
NAME:  Heather [name], Heather [name]          ACCOUNT NO.:  [id number]   MEDICAL RECORD NO.:  [id number]          PATIENT TYPE:  REC   LOCATION:  FOOT                         [facility]ACILITY[facility]  [facility]   PHYSICIAN:  Jonelle Sports. Sevier, M.D. DATE OF BIRTH:  1941/02/27   DATE OF CONSULTATION:  09/22/2004  DATE OF DISCHARGE:                                   CONSULTATION   HISTORY:  This 70 year old black female is seen at the courtesy of Dr.  Okey Dupre for assistance with interdigital corn at the fourth/fifth  interspace of the right foot.   The patient is not diabetic and does not currently smoke, although she had  done so in the past. She has really had no significant problems with her  feet until about two years ago she noted some maceration and the development  of interdigital soreness and eventual corn formation at the right  fourth/fifth interspace. She saw a physician who gave her some cream to use  which initially helped but she developed a rash over the entire body the  same time. No connection between this rash and that cream was ever  established. She is also unaware what the name of that cream was.   Several months ago the area flared up again, particularly with increasing  pain and more callus formation and she consulted a dermatologist who  mechanically removed some of the corn from the area and advised the some  cream again, the name of which she is unaware. That individual apparently  advised her if it did not clear up to see [place].   She arrives now with the lesion persisting and having recently applied some  corn remedy (presumably salicylic acid) to the area.   PAST MEDICAL HISTORY:  1.  Hypertension.  2.  Anxiety.  3.  Gastroesophageal reflux.  4.  Palpitations.  5.  Migraines.   ALLERGIES:  She is said to be allergic to DARVON.   REGULAR MEDICATIONS:  Fluoxetine, hydrochlorothiazide, clonidine,  amitriptyline, ferrous gluconate, some allergy, medication, and Alphagan eye   drops.   EXAMINATION:  Examination today is limited to the distal lower extremities.  The feet are free of gross deformity or of edema. Skin temperatures are  normal and symmetrical. Pulses are everywhere palpable and adequate.  Monofilament testing shows preservation of protective sensation throughout  both feet.   There are no significant lesions on the plantar aspects of the feet.   At the 4/5 interdigital space on the right is evidence of considerable  maceration and now apparent burn from the application of the corn  preparation. There does not appear to be active ulceration or any deep  penetration or infection.   DISPOSITION:  1.  The area is debrided of the now dead cornified material and this was      accomplished sharply without discomfort to the patient and without      bleeding. The area appears then relatively clean, although clearly still      involved by what probably is a low grade infectious tinea process. The      the patient is advised to obtain Lamisil cream and to  place it in this      interdigital space on a twice daily basis, cleansing generally and      drying well before each application.  2.  She is given lamb's wool and shown how to apply this so as to wick out      moisture from that particular area. It is suggested to her that she      reapply this after each application of Lamisil.  3.  She is advised to wear sandals for the time being so that lateral      pressure will be relieved from that fifth toe and also so that the area      may be well aerated and dried.  4.  Follow-up visit will be here in 13 days.      RES/MEDQ  D:  09/22/2004  T:  09/22/2004  Job:  [id number]   cc:   Okey Dupre, M.D.

## 2010-12-23 NOTE — Op Note (Signed)
NAME:  Heather [name], Heather [name]          ACCOUNT NO.:  [id number]   MEDICAL RECORD NO.:  [id number]          PATIENT TYPE:  AMB   LOCATION:  ENDO                         FACILITY:  [facility]   PHYSICIAN:  Shirley Friar, MDDATE OF BIRTH:  1941/04/27   DATE OF PROCEDURE:  05/05/2005  DATE OF DISCHARGE:                                 OPERATIVE REPORT   PROCEDURE PERFORMED:  Upper endoscopy.   INDICATIONS FOR PROCEDURE:  Intermittent solid food dysphagia.   MEDICATIONS GIVEN:  Demerol 50 mg, Versed 5 mg IV.   DESCRIPTION OF PROCEDURE:  Initially a 8mm endoscope was inserted into the  oropharynx, but I could not intubate the esophagus despite multiple tries.  This scope was then removed and a 6 mm GIF XP160 scope was inserted into the  oropharynx and the esophagus was intubated.  The endoscope was passed down  into the stomach down to the second portion of the duodenum.  On careful  withdrawal, the duodenum was normal.  The stomach was normal including a  normal angularis, cardia and fundus.  On further withdrawal back into the  esophagus, the gastroesophageal junction was noted to be 42 cm from the  incisors.  There was no ulceration, edema or erythema in the esophagus.  There was no esophageal web or ring noted on endoscopy.  On careful  withdrawal into the upper esophageal area, there appeared to be irregular  esophageal contractions in the upper esophageal area and the upper  esophageal sphincter was noted at 15 cm from the incisors.  No Zenker's  diverticulum was noted.  The endoscope was withdrawn and confirmed the above  findings.   During the procedure, the patient had short episodes of hypertension but  this resolved post procedure.  The patient also had some coughing spells  during and after the procedure which were thought to be secondary to a small  amount of bleeding in the oropharynx that resolved.   ASSESSMENT:  1.  No obstruction, web or ring noted.  2.  Possible  esophageal dysmotility problem.   PLAN:  1.  Repeat barium swallow with concentration on upper esophageal sphincter.  2.  Schedule the patient for esophageal manometry.  3.  Continue proton pump inhibitor.  4.  Return to my office in four to six weeks at [facility].      Shirley Friar, MD  Electronically Signed     VCS/MEDQ  D:  05/05/2005  T:  05/05/2005  Job:  [id number]

## 2010-12-23 NOTE — Op Note (Signed)
NAME:  Heather [name], Heather [name] NO.:  [id number]   MEDICAL RECORD NO.:  [id number]          PATIENT TYPE:  AMB   LOCATION:  ENDO                         FACILITY:  [facility]   PHYSICIAN:  Shirley Friar, MDDATE OF BIRTH:  January 26, 1941   DATE OF PROCEDURE:  05/05/2005  DATE OF DISCHARGE:                                 OPERATIVE REPORT   ADDENDUM:  That report should be cc'd to Donald Pore, MD.      Shirley Friar, MD  Electronically Signed     VCS/MEDQ  D:  05/05/2005  T:  05/05/2005  Job:  [id number]   cc:   Donald Pore, MD  Fax: [phone]

## 2010-12-26 ENCOUNTER — Encounter (HOSPITAL_COMMUNITY): Payer: Medicare PPO

## 2010-12-26 ENCOUNTER — Ambulatory Visit (HOSPITAL_COMMUNITY): Payer: Medicare PPO

## 2010-12-30 ENCOUNTER — Telehealth: Payer: Self-pay | Admitting: *Deleted

## 2010-12-30 NOTE — Telephone Encounter (Signed)
Message copied by Army Fossa on Fri Dec 30, 2010  9:26 AM ------      Message from: Blanch Media A      Created: Thu Dec 22, 2010 10:25 AM       Lela,      I put in GI referral.      Pt stated had CXR from [facility] few months ago. Can you get records - the cxr and office note? THanks

## 2010-12-30 NOTE — Telephone Encounter (Signed)
CALLED [facility] DEPT. , LEFT VOICE MESSAGE REQUESTING RECENT OFFICE NOTE AND CHEST X RAY TO BE FAXED TO [facility] [phone] AND CALL BACK # [phone]. LELA STURDIVANT NTII    5/25/012    @  9:31AM

## 2011-01-10 ENCOUNTER — Ambulatory Visit (INDEPENDENT_AMBULATORY_CARE_PROVIDER_SITE_OTHER): Payer: Medicare PPO | Admitting: Internal Medicine

## 2011-01-10 VITALS — BP 118/67 | HR 71 | Temp 97.1°F | Ht 70.0 in | Wt 194.2 lb

## 2011-01-10 DIAGNOSIS — I10 Essential (primary) hypertension: Secondary | ICD-10-CM

## 2011-01-10 DIAGNOSIS — N390 Urinary tract infection, site not specified: Secondary | ICD-10-CM | POA: Insufficient documentation

## 2011-01-10 LAB — URINALYSIS, ROUTINE W REFLEX MICROSCOPIC
Ketones, ur: NEGATIVE mg/dL
Nitrite: NEGATIVE
Protein, ur: NEGATIVE mg/dL
Urobilinogen, UA: 1 mg/dL (ref 0.0–1.0)

## 2011-01-10 MED ORDER — CIPROFLOXACIN HCL 500 MG PO TABS: 500.0000 mg | ORAL_TABLET | Freq: Two times a day (BID) | ORAL | Status: AC

## 2011-01-10 NOTE — Patient Instructions (Signed)
Return in one week.  Dont forget to bring your medicine on next visit.

## 2011-01-10 NOTE — Assessment & Plan Note (Signed)
Uncomplicated UTI. Will treat with 5 day course of cipro, because I am not sure about bactrim would give allergic reaction in her (she claims she might had one in the past). Culture just to make sure that we are not dealing with other uncommon organization. She reports that she visited very sick lady who had recurrent UTI for months last week right before her symptoms started. Although, I think this is unrelated, I will culture urine.

## 2011-01-10 NOTE — Progress Notes (Signed)
  Subjective:    Patient ID: Heather [name], female    DOB: 12/08/1940, 70 y.o.   MRN: [id number]  HPI 70 years old female with PMH of HTN, depression, gout, and NASH presents with urinary symptoms. That started about one week ago and comprise of frequency, urgency, burning and pubic discomfort. She denies fevers but endorse weakness. She has loss of appetite and back pain.   Review of Systems  Constitutional: Positive for diaphoresis and activity change. Negative for fever, chills and appetite change.  HENT: Negative for nosebleeds, facial swelling, neck pain and tinnitus.   Eyes: Negative for pain, discharge and visual disturbance.  Respiratory: Negative for cough, chest tightness and shortness of breath.   Cardiovascular: Negative for chest pain and palpitations.  Gastrointestinal: Negative for nausea, vomiting, abdominal pain, blood in stool and abdominal distention.  Genitourinary: Positive for dysuria, flank pain and difficulty urinating.  Musculoskeletal: Positive for back pain.  Skin: Negative for rash.  Neurological: Negative for dizziness, seizures, weakness and headaches.  Psychiatric/Behavioral: Negative for suicidal ideas, confusion and agitation.       Objective:   Physical Exam  Constitutional: She is oriented to person, place, and time. She appears well-developed and well-nourished.  HENT:  Head: Normocephalic and atraumatic.  Right Ear: External ear normal.  Left Ear: External ear normal.  Eyes: Conjunctivae and EOM are normal. Pupils are equal, round, and reactive to light.  Neck: No JVD present. No tracheal deviation present. No thyromegaly present.  Cardiovascular: Normal rate, regular rhythm and normal heart sounds.  Exam reveals no gallop.   No murmur heard. Pulmonary/Chest: No respiratory distress. She has no wheezes. She has no rales. She exhibits no tenderness.  Abdominal: Soft. Bowel sounds are normal. She exhibits no distension and no mass. There is  tenderness. There is no rebound and no guarding.  Musculoskeletal: Normal range of motion. She exhibits no edema and no tenderness.  Lymphadenopathy:    She has no cervical adenopathy.  Neurological: She is alert and oriented to person, place, and time. She has normal reflexes. No cranial nerve deficit. Coordination normal.  Skin: No rash noted. No erythema.  Psychiatric: Her behavior is normal. Thought content normal. Her affect is blunt.          Assessment & Plan:

## 2011-01-10 NOTE — Assessment & Plan Note (Signed)
Patient did not bring her meds with her and she can not reliably tell me what she is taking and not taking. Once again emphasized that she bring her meds with her. The reconciliation shows that she filled up other diuretic, thought that she is not supposed to take clonidine and likely continues not take Norvasc. I have taken clonidine off for now, but the true reconciliation can not happen until she brings her meds.

## 2011-01-11 LAB — URINALYSIS, MICROSCOPIC ONLY
Casts: NONE SEEN
WBC, UA: >50 WBC/hpf — AB (ref <3)

## 2011-01-17 ENCOUNTER — Ambulatory Visit (INDEPENDENT_AMBULATORY_CARE_PROVIDER_SITE_OTHER): Payer: Medicare PPO | Admitting: Internal Medicine

## 2011-01-17 ENCOUNTER — Encounter: Payer: Self-pay | Admitting: Internal Medicine

## 2011-01-17 DIAGNOSIS — R05 Cough: Secondary | ICD-10-CM

## 2011-01-17 DIAGNOSIS — N390 Urinary tract infection, site not specified: Secondary | ICD-10-CM

## 2011-01-17 DIAGNOSIS — I10 Essential (primary) hypertension: Secondary | ICD-10-CM

## 2011-01-17 MED ORDER — CETIRIZINE HCL 10 MG PO CAPS: 10.0000 mg | ORAL_CAPSULE | Freq: Every day | ORAL | Status: DC

## 2011-01-17 NOTE — Patient Instructions (Signed)
Return in one month. Cetirizine (allerclear at [employer]) is over the counter, take once a day.

## 2011-01-17 NOTE — Assessment & Plan Note (Signed)
Chronic be related to allergy, GERD, laryngeal dysfunction, vocal cord prolapse. I have given her a generic medication for her allergy without decongestant. Patient is already on omeprazole 40 mg her GERD. Continue to follow. If not improved in the next few months May need evaluation with ENT

## 2011-01-17 NOTE — Assessment & Plan Note (Signed)
Resolved

## 2011-01-17 NOTE — Progress Notes (Signed)
  Subjective:    Patient ID: Heather [name], female    DOB: 1941/04/22, 70 y.o.   MRN: [id number]  HPI  70 years old female with PMH of HTN, depression, gout, and NASH presents for follow up of urinary symptoms. She was treated for urinary tract infection with ciprofloxacin and has done well. She denies that most of her urinary symptoms have resolved. Patient feels better overall. Patient has brought out of medications to diet as instructed with her her meds clarification. Review of Systems  Constitutional: Negative for fever, chills, diaphoresis, activity change and appetite change.  HENT: Negative for nosebleeds, facial swelling, neck pain and tinnitus.   Eyes: Negative for pain, discharge and visual disturbance.  Respiratory: Negative for cough, chest tightness and shortness of breath.   Cardiovascular: Negative for chest pain and palpitations.  Gastrointestinal: Negative for nausea, vomiting, abdominal pain, blood in stool and abdominal distention.  Genitourinary: Negative for dysuria, flank pain and difficulty urinating.  Musculoskeletal: Negative for back pain.  Skin: Negative for rash.  Neurological: Negative for dizziness, seizures, weakness and headaches.  Psychiatric/Behavioral: Negative for suicidal ideas, confusion and agitation.       Objective:   Physical Exam  Constitutional: She is oriented to person, place, and time. She appears well-developed and well-nourished.  HENT:  Head: Normocephalic and atraumatic.  Right Ear: External ear normal.  Left Ear: External ear normal.  Eyes: Conjunctivae and EOM are normal. Pupils are equal, round, and reactive to light.  Neck: No JVD present. No tracheal deviation present. No thyromegaly present.  Cardiovascular: Normal rate, regular rhythm and normal heart sounds.  Exam reveals no gallop.   No murmur heard. Pulmonary/Chest: No respiratory distress. She has no wheezes. She has no rales. She exhibits no tenderness.  Abdominal:  Soft. Bowel sounds are normal. She exhibits no distension and no mass. There is no tenderness. There is no rebound and no guarding.  Musculoskeletal: Normal range of motion. She exhibits no edema and no tenderness.  Lymphadenopathy:    She has no cervical adenopathy.  Neurological: She is alert and oriented to person, place, and time. She has normal reflexes. No cranial nerve deficit. Coordination normal.  Skin: No rash noted. No erythema.  Psychiatric: Her behavior is normal. Thought content normal. Her affect is blunt.          Assessment & Plan:

## 2011-01-17 NOTE — Assessment & Plan Note (Signed)
The pressure is better controlled but systolic pressure is high. Patient has brought his medications which I have verified. She is well-nourished but I'll let him but she cannot term event. At this time continue her medications as she has been using it for at least last few weeks. There is significant overlap and ovaries of diuretic in this regimen. Suspicion is getting often confused with her medications from different sources I have decided to keep T. Medications same for this visit. Have thrown away the medication she is not using. This should be addressed on next visit and may be changed to discontinue one diuretic are up to and add in Norvasc or ACE inhibitor.

## 2011-02-02 ENCOUNTER — Other Ambulatory Visit: Payer: Self-pay | Admitting: Ophthalmology

## 2011-02-03 NOTE — Telephone Encounter (Signed)
If she continues to have a cough, I need to repeat the CXR as I informed her last office visit. I can order without her coming in to be seen

## 2011-02-06 NOTE — Telephone Encounter (Signed)
Call to pt to see how she is doing.  Pt had gotten a refill on her cough medication and is to have a Chest X ray done if she does not improve.  Message left for pt to call the Clinics.

## 2011-02-07 NOTE — Telephone Encounter (Signed)
Pt called today to let us know her cough is gone !!  She thanks [place].

## 2011-02-13 ENCOUNTER — Ambulatory Visit (INDEPENDENT_AMBULATORY_CARE_PROVIDER_SITE_OTHER): Payer: Medicare PPO | Admitting: Internal Medicine

## 2011-02-13 ENCOUNTER — Encounter: Payer: Self-pay | Admitting: Internal Medicine

## 2011-02-13 DIAGNOSIS — K219 Gastro-esophageal reflux disease without esophagitis: Secondary | ICD-10-CM

## 2011-02-13 DIAGNOSIS — K222 Esophageal obstruction: Secondary | ICD-10-CM

## 2011-02-13 DIAGNOSIS — R05 Cough: Secondary | ICD-10-CM

## 2011-02-13 DIAGNOSIS — R059 Cough, unspecified: Secondary | ICD-10-CM

## 2011-02-13 DIAGNOSIS — R1319 Other dysphagia: Secondary | ICD-10-CM

## 2011-02-13 NOTE — Patient Instructions (Signed)
You have been scheduled for an Endoscopy with separate instructions given.  

## 2011-02-13 NOTE — Assessment & Plan Note (Signed)
She does not have symptoms as long as she is on a PPI. After the upper endoscopy I want to consider dose recurrent or frequency reduction of her PPI to minimize potential future problems.

## 2011-02-13 NOTE — Assessment & Plan Note (Signed)
He has recurrent symptoms of her cricopharyngeal bar. This responded to esophageal dilation in the past. Hopefully will again. If there is a significant component of cervical spine impression an impingement it may not. I've explained this to her and she understands and agrees the risks benefits indications of an upper GI endoscopy and esophageal dilation which will be performed again. Further plans pending that.  She appears to have some esophageal dysmotility as well, and she has been thought to have a component of GERD. We'll need to look to see she really needs it twice daily PPI, however. If we can reduce the frequency and/or dose of that I think we should.

## 2011-02-13 NOTE — Progress Notes (Signed)
  Subjective:    Patient ID: Heather [name], female    DOB: 1941-04-17, 70 y.o.   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 Last dilated December 2009 with success. A few months ago she started having recurrent dysphagia, shape coughing leading or drinking and actually had a pneumonia treated as an outpatient. A modified barium swallow has shown recurrence or persistence of the cricopharyngeal bar and some oropharyngeal dysphagia problems as well. She reports intermittent mostly solid dysphagia, last had problems with grits and sausage. This was yesterday. She drinks and swallows and sometimes regurgitates. It is identical to the problems for which dilation has helped    Review of Systems She denies any cervical radiculopathy symptoms, there is no significant neck pain.    Objective:   Physical Exam  Constitutional: She is oriented to person, place, and time. She appears well-developed and well-nourished. No distress.  Neck: Neck supple. No tracheal deviation present. No thyromegaly present.  Cardiovascular: Normal rate, regular rhythm and normal heart sounds.  Exam reveals no gallop and no friction rub.   No murmur heard. Pulmonary/Chest: Effort normal and breath sounds normal.  Abdominal: Soft. Bowel sounds are normal. She exhibits no distension and no mass. There is no tenderness. There is no guarding.  Lymphadenopathy:    She has no cervical adenopathy.  Neurological: She is alert and oriented to person, place, and time. No cranial nerve deficit.  Psychiatric: She has a normal mood and affect.          Assessment & Plan:

## 2011-02-13 NOTE — Assessment & Plan Note (Signed)
Some of this is related to her proximal esophageal stenosis, cricopharyngeal bar or achalasia. Hopefully dilation will help this again. The modified barium swallow report indicates she has some impression on the esophagus from her cervical spine and that will not be fixed by a dilation. Since she has responded to dilation in the past I think it's worth trying again.

## 2011-02-16 ENCOUNTER — Ambulatory Visit (AMBULATORY_SURGERY_CENTER): Payer: Medicare PPO | Admitting: Internal Medicine

## 2011-02-16 ENCOUNTER — Encounter: Payer: Self-pay | Admitting: Internal Medicine

## 2011-02-16 VITALS — BP 129/77 | HR 81 | Temp 98.0°F | Resp 13 | Ht 70.0 in | Wt 197.0 lb

## 2011-02-16 DIAGNOSIS — R1319 Other dysphagia: Secondary | ICD-10-CM

## 2011-02-16 DIAGNOSIS — K2970 Gastritis, unspecified, without bleeding: Secondary | ICD-10-CM

## 2011-02-16 DIAGNOSIS — R1310 Dysphagia, unspecified: Secondary | ICD-10-CM

## 2011-02-16 DIAGNOSIS — K222 Esophageal obstruction: Secondary | ICD-10-CM

## 2011-02-16 DIAGNOSIS — D131 Benign neoplasm of stomach: Secondary | ICD-10-CM

## 2011-02-16 DIAGNOSIS — K219 Gastro-esophageal reflux disease without esophagitis: Secondary | ICD-10-CM

## 2011-02-16 DIAGNOSIS — K2940 Chronic atrophic gastritis without bleeding: Secondary | ICD-10-CM

## 2011-02-16 MED ORDER — SODIUM CHLORIDE 0.9 % IV SOLN: 500.0000 mL | INTRAVENOUS | Status: DC

## 2011-02-16 NOTE — Progress Notes (Signed)
Pt tolerated the egd and dilatation very well.  No complaints noted. MAW

## 2011-02-16 NOTE — Patient Instructions (Addendum)
At the endoscopy today and was found to have some inflammation in the stomach and biopsies of that were taken. It does not look serious but we'll try and understand what caused it. There were some stomach polyps biopsied also, they do not look like a problem but we will let you know the results in a letter. The top of the esophagus was narrowed again and it was dilated. You may also have some difficulty with the squeezing function of the esophagus which the dilation may or may not help.  Only take in clear liquids until 4 PM today. Then you may try soft foods and tomorrow, return to your regular consistency of foods assuming you're okay.  Please contact Dr. Marvell Fuller office to arrange a followup appointment for 4-6 weeks so we can see how you're doing. If this affects his everything you do not need to keep the followup and may cancel it.  Please make an appointment to see your primary care doctor to follow-up about your blood pressure medications as it appears that you are not certain about what you are to take. Discharge instructions given. Handouts on gastritis and a dilatation diet given. Resume previous medications.

## 2011-02-17 ENCOUNTER — Telehealth: Payer: Self-pay | Admitting: *Deleted

## 2011-02-17 NOTE — Telephone Encounter (Signed)
Left message on machine.

## 2011-02-21 ENCOUNTER — Other Ambulatory Visit: Payer: Self-pay | Admitting: Internal Medicine

## 2011-02-21 MED ORDER — TORSEMIDE 5 MG PO TABS: 5.0000 mg | ORAL_TABLET | Freq: Every day | ORAL | Status: DC

## 2011-02-23 NOTE — Progress Notes (Signed)
Quick Note:  Gastritis and hyperplastic/fundic gland polyps on pathology No recall ______

## 2011-03-27 ENCOUNTER — Emergency Department (HOSPITAL_COMMUNITY): Payer: Medicare PPO

## 2011-03-27 ENCOUNTER — Emergency Department (HOSPITAL_COMMUNITY)
Admission: EM | Admit: 2011-03-27 | Discharge: 2011-03-28 | Disposition: A | Discharge status: Home / Self Care | Payer: Medicare PPO | Attending: Emergency Medicine | Admitting: Emergency Medicine

## 2011-03-27 DIAGNOSIS — M2390 Unspecified internal derangement of unspecified knee: Secondary | ICD-10-CM | POA: Insufficient documentation

## 2011-03-27 DIAGNOSIS — W010XXA Fall on same level from slipping, tripping and stumbling without subsequent striking against object, initial encounter: Secondary | ICD-10-CM | POA: Insufficient documentation

## 2011-03-27 DIAGNOSIS — Y990 Civilian activity done for income or pay: Secondary | ICD-10-CM | POA: Insufficient documentation

## 2011-03-27 DIAGNOSIS — M25569 Pain in unspecified knee: Secondary | ICD-10-CM | POA: Insufficient documentation

## 2011-03-27 DIAGNOSIS — I10 Essential (primary) hypertension: Secondary | ICD-10-CM | POA: Insufficient documentation

## 2011-03-27 DIAGNOSIS — M25559 Pain in unspecified hip: Secondary | ICD-10-CM | POA: Insufficient documentation

## 2011-03-27 DIAGNOSIS — Y9269 Other specified industrial and construction area as the place of occurrence of the external cause: Secondary | ICD-10-CM | POA: Insufficient documentation

## 2011-03-27 DIAGNOSIS — Z79899 Other long term (current) drug therapy: Secondary | ICD-10-CM | POA: Insufficient documentation

## 2011-03-27 DIAGNOSIS — T1490XA Injury, unspecified, initial encounter: Secondary | ICD-10-CM | POA: Insufficient documentation

## 2011-03-29 ENCOUNTER — Encounter: Payer: Medicare PPO | Admitting: Internal Medicine

## 2011-04-19 ENCOUNTER — Encounter: Payer: Self-pay | Admitting: Internal Medicine

## 2011-04-19 ENCOUNTER — Ambulatory Visit (INDEPENDENT_AMBULATORY_CARE_PROVIDER_SITE_OTHER): Payer: Medicare PPO | Admitting: Internal Medicine

## 2011-04-19 DIAGNOSIS — I10 Essential (primary) hypertension: Secondary | ICD-10-CM

## 2011-04-19 DIAGNOSIS — R87612 Low grade squamous intraepithelial lesion on cytologic smear of cervix (LGSIL): Secondary | ICD-10-CM

## 2011-04-19 DIAGNOSIS — R059 Cough, unspecified: Secondary | ICD-10-CM

## 2011-04-19 DIAGNOSIS — K219 Gastro-esophageal reflux disease without esophagitis: Secondary | ICD-10-CM

## 2011-04-19 DIAGNOSIS — IMO0002 Reserved for concepts with insufficient information to code with codable children: Secondary | ICD-10-CM

## 2011-04-19 DIAGNOSIS — R05 Cough: Secondary | ICD-10-CM

## 2011-04-19 NOTE — Patient Instructions (Signed)
Gastroesophageal Reflux Disease (GERD) Your caregiver has diagnosed your chest discomfort as caused by gastroesophageal reflux disease (GERD). GERD is caused by a reflux of acid from your stomach into the digestive tube between your mouth and stomach (esophagus). Acid in contact with the esophagus causes soreness (inflammation) resulting in heartburn or chest pain. It may cause small holes in the lining of the esophagus (ulcers). CAUSES  Increased body weight puts pressure on the stomach, making acid rise.   Smoking increases acid production.   Alcohol decreases pressure on the valve between the stomach and esophagus (lower esophageal sphincter), allowing acid from the stomach into the esophagus.   Late evening meals and a full stomach increase pressure and acid production.   Lower esophageal sphincter is malformed.   Sometimes, no reason is found.  HOME CARE INSTRUCTIONS  Change the factors that you can control. Weight, smoking, or alcohol changes may be difficult to change on your own. Your caregiver can provide guidance and medical therapy.   Raising the head of your bed may help you to sleep.   Over-the-counter medicines will decrease acid production. Your caregiver can also prescribe medicines for this. Only take over-the-counter or prescription medicines for pain, discomfort, or fever as directed by your caregiver.   1/2 to 1 teaspoon of an antacid taken every hour while awake, with meals, and at bedtime, can neutralize acid.   DO NOT take aspirin, ibuprofen, or other nonsteroidal anti-inflammatory drugs (NSAIDs).  SEEK IMMEDIATE MEDICAL CARE IF:  The pain changes in location (radiates into arms, neck, jaw, teeth, or back), intensity, or duration.   You start feeling sick to your stomach (nauseous), start throwing up (vomiting), or sweating (diaphoresis).   You develop left arm or jaw pain.   You develop pain going into your back, shortness of breath, or you pass out.    There is vomiting of fluid that is green, yellow, or looks like coffee grounds or blood.  These symptoms could signal other problems, such as heart disease. MAKE SURE YOU:  Understand these instructions.   Will watch your condition.   Will get help right away if you are not doing well or get worse.  Document Released: 05/03/2005 Document Re-Released: 10/18/2009 [facility] Patient Information 2011 [place], [place].  1. Continue all your medication as prescribed.  Use the list Given to mark what you are taking and what you are not and bring it to your follow up.    2.  Follow up with Dr. Leone Payor for the problems swallowing and the cough when lying down.  3.  Follow up with OB/GYN for a repeat pap smear.  4. Follow up in the clinic in 1-2 months with your PCP to see how you're doing.

## 2011-04-19 NOTE — Progress Notes (Signed)
Subjective:   Patient ID: Heather [name] female   DOB: 04-05-1941 70 y.o.   MRN: [id number]  HPI: [name] is a 70 y.o. woman who presents today complaining of a non-productive cough that has been going on for years.  She states that it just doesn't seem to be getting better and it is wearing her down.  She states that it happens mostly at night but can happen sporadically during the day.  She has been having to sleep in a recliner because whenever she lays flat she starts to cough.  She denies any nausea, vomiting, metallic taste in her mouth.  She has been followed by Dr. Leone Payor for her GERD and recently underwent EGD with dilation due to recurrent esophageal stenosis.    She also states that she has been having trouble swallowing solid things.  She has had to crush her pills again as well as ensure that she has chewed her food well to get it to go down.  She denies any problems swallowing liquids or choking.   She has a history of LGSIL on a pap smear from 2008 and has never had follow up.  She denies any vaginal bleeding, lower abdominal pain, or masses in her abdomen.    Past Medical History  Diagnosis Date  . Depression     Controlled with SSRI  . HTN (hypertension)   . GERD (gastroesophageal reflux disease)     Chronic PPI's  . S/P dilatation of esophageal stricture     12/09 Dr Leone Payor. Dilation of tight stricture proximal eso.  Prior dilation 2/08.  . Seasonal allergies     Zyrtec D PRN  . NASH (nonalcoholic steatohepatitis)     Per [place] 10/10  . History of PSVT (paroxysmal supraventricular tachycardia)     Remote and self limited.  . Gout     No crystal dx. R podegra x 3 episodes around 2010. Did not take the allopurinol that was rec 2011.  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 Chronic venous insufficiency     Present for years. Norvasc D/C'd 2012 thinking it might be contributing.  . Migraines    . Glaucoma    Current Outpatient Prescriptions  Medication Sig Dispense Refill  . albuterol (PROVENTIL,VENTOLIN) 90 MCG/ACT inhaler Inhale 2 puffs into the lungs every 4 (four) hours as needed.  51 g  11  . amitriptyline (ELAVIL) 50 MG tablet Take 1 tablet (50 mg total) by mouth at bedtime.  90 tablet  3  . amLODipine (NORVASC) 5 MG tablet Take 5 mg by mouth daily.        . brimonidine (ALPHAGAN P) 0.1 % SOLN        . Cetirizine HCl 10 MG CAPS Take 1 capsule (10 mg total) by mouth daily.  90 capsule  0  . chlorthalidone (HYGROTON) 25 MG tablet Take 1 tablet (25 mg total) by mouth daily.  90 tablet  3  . cloNIDine (CATAPRES) 0.1 MG tablet Take 0.1 mg by mouth 2 (two) times daily.        . colchicine 0.6 MG tablet Take 1 tablet (0.6 mg total) by mouth 2 (two) times daily as needed.  60 tablet  3  . FLUoxetine (PROZAC) 20 MG capsule Take 1 capsule (20 mg total) by mouth daily.  90 capsule  3  . fluticasone (FLONASE) 50 MCG/ACT nasal spray 2 sprays by Nasal route daily.  48 g  3  . metoprolol (TOPROL-XL) 200 MG 24 hr tablet Take 200 mg by mouth daily.        . Multiple Vitamin (MULTIVITAMIN) capsule Take 1 capsule by mouth daily.        Marland Kitchen omeprazole (PRILOSEC) 40 MG capsule Take 1 capsule (40 mg total) by mouth 2 (two) times daily.  180 capsule  3  . PROAIR HFA 108 (90 BASE) MCG/ACT inhaler       . timolol (TIMOPTIC) 0.25 % ophthalmic solution 1 drop 2 (two) times daily.        Marland Kitchen torsemide (DEMADEX) 5 MG tablet Take 1 tablet (5 mg total) by mouth daily.  31 tablet  3   Current Facility-Administered Medications  Medication Dose Route Frequency Provider Last Rate Last Dose  . 0.9 %  sodium chloride infusion  500 mL Intravenous Continuous Iva Boop, MD       Family History  Problem Relation Age of Onset  . Heart disease Mother   . Hypertension Mother   . Heart disease Father   . Hypertension Father   . Heart disease Sister   . Hypertension Sister   . Throat cancer Brother   .  Hypertension Brother   . Heart disease Brother   . Colon cancer Neg Hx    History   Social History  . Marital Status: Widowed    Spouse Name: N/A    Number of Children: N/A  . Years of Education: N/A   Social History Main Topics  . Smoking status: Former Smoker    Types: Cigarettes    Quit date: 08/07/1968  . Smokeless tobacco: Never Used  . Alcohol Use: No  . Drug Use: No  . Sexually Active: No   Other Topics Concern  . None   Social History Narrative  . None   Review of Systems: Constitutional: Denies fever, chills, diaphoresis, appetite change and fatigue.  HEENT: Denies photophobia, eye pain, redness, hearing loss, ear pain, congestion, sore throat, rhinorrhea, sneezing, mouth sores, trouble swallowing, neck pain, neck stiffness and tinnitus.   Respiratory: Denies SOB, DOE, cough, chest tightness,  and wheezing.   Cardiovascular: Denies chest pain, palpitations and leg swelling.  Gastrointestinal: Denies nausea, vomiting, abdominal pain, diarrhea, constipation, blood in stool and abdominal distention.  Genitourinary: Denies dysuria, urgency, frequency, hematuria, flank pain and difficulty urinating.  Musculoskeletal: Denies myalgias, back pain, joint swelling, arthralgias and gait problem.  Skin: Denies pallor, rash and wound.  Neurological: Denies dizziness, seizures, syncope, weakness, light-headedness, numbness and headaches.  Hematological: Denies adenopathy. Easy bruising, personal or family bleeding history  Psychiatric/Behavioral: Denies suicidal ideation, mood changes, confusion, nervousness, sleep disturbance and agitation  Objective:  Physical Exam: Filed Vitals:   04/19/11 0834  BP: 151/84  Pulse: 77  Temp: 97.2 F (36.2 C)  TempSrc: Oral  Height: 5\' 9"  (1.753 m)  Weight: 197 lb 4.8 oz (89.495 kg)   Constitutional: Vital signs reviewed.  Patient is a well-developed and well-nourished woman in no acute distress and cooperative with exam. Alert and  oriented x3.  Head: Normocephalic and atraumatic Ear: TM normal bilaterally Mouth: no erythema or exudates, MMM Eyes: PERRL, EOMI, conjunctivae normal, No scleral icterus.  Neck: Supple, Trachea midline normal ROM, No JVD, mass, thyromegaly, or carotid bruit present.  Cardiovascular: RRR, S1 normal, S2 normal, no MRG, pulses symmetric and intact bilaterally Pulmonary/Chest: CTAB, no wheezes, rales, or rhonchi Abdominal: Soft. Mild epigastric tenderness, non-distended, bowel sounds are normal, no masses, organomegaly, or  guarding present.  GU: no CVA tenderness Musculoskeletal: No joint deformities, erythema, or stiffness, ROM full and no nontender Hematology: no cervical, inginal, or axillary adenopathy.  Neurological: A&O x3, Strength is normal and symmetric bilaterally, cranial nerve II-XII are grossly intact, no focal motor deficit, sensory intact to light touch bilaterally.  Skin: Warm, dry and intact. No rash, cyanosis, or clubbing.  Psychiatric: Normal mood and affect. speech and behavior is normal. Judgment and thought content normal. Cognition and memory are normal.   Assessment & Plan:

## 2011-04-21 ENCOUNTER — Other Ambulatory Visit: Payer: Self-pay | Admitting: *Deleted

## 2011-04-24 MED ORDER — FLUOXETINE HCL 20 MG PO CAPS: 20.0000 mg | ORAL_CAPSULE | Freq: Every day | ORAL | Status: DC

## 2011-04-24 NOTE — Assessment & Plan Note (Signed)
BP Readings from Last 3 Encounters:  04/19/11 151/84  02/16/11 129/77  02/13/11 142/74   Blood pressure today is mildly elevated but she has not taken her medications today.  We will continue to monitor and encourage her to take her medications prior to her follow up.  The importance of taking all her medications and bringing them to her doctor's visits was reiterated.

## 2011-04-24 NOTE — Assessment & Plan Note (Addendum)
Her cough appears to be secondary to her GERD with the increase when she lays flat and lack of URI signs.  She is not on any medications that could cause a chronic cough and, though she has allergies, she has no other HEENT signs so I'm doubtful that it is due to those.

## 2011-04-24 NOTE — Assessment & Plan Note (Signed)
Her GERD is likely the cause of her chronic cough and she is being managed by Dr. Leone Payor.  I will forward my clinic note to Dr. Leone Payor for his consideration of possible referral to a surgeon to see if she would need a Nissan fundoplication or something for her chronic reflux.

## 2011-04-24 NOTE — Assessment & Plan Note (Signed)
She had an abnormal PAP and Dr. Donnelly Stager last note mentions the inability to pass the speculum.  We will refer her back to OB/GYN for their evaluation.

## 2011-04-29 ENCOUNTER — Emergency Department (HOSPITAL_COMMUNITY)
Admission: EM | Admit: 2011-04-29 | Discharge: 2011-04-29 | Disposition: A | Discharge status: Home / Self Care | Payer: Medicare PPO | Attending: Emergency Medicine | Admitting: Emergency Medicine

## 2011-04-29 ENCOUNTER — Emergency Department (HOSPITAL_COMMUNITY): Payer: Medicare PPO

## 2011-04-29 DIAGNOSIS — W1809XA Striking against other object with subsequent fall, initial encounter: Secondary | ICD-10-CM | POA: Insufficient documentation

## 2011-04-29 DIAGNOSIS — Y9229 Other specified public building as the place of occurrence of the external cause: Secondary | ICD-10-CM | POA: Insufficient documentation

## 2011-04-29 DIAGNOSIS — I10 Essential (primary) hypertension: Secondary | ICD-10-CM | POA: Insufficient documentation

## 2011-04-29 DIAGNOSIS — S92919A Unspecified fracture of unspecified toe(s), initial encounter for closed fracture: Secondary | ICD-10-CM | POA: Insufficient documentation

## 2011-04-29 DIAGNOSIS — S8000XA Contusion of unspecified knee, initial encounter: Secondary | ICD-10-CM | POA: Insufficient documentation

## 2011-04-29 DIAGNOSIS — Z79899 Other long term (current) drug therapy: Secondary | ICD-10-CM | POA: Insufficient documentation

## 2011-04-29 DIAGNOSIS — S298XXA Other specified injuries of thorax, initial encounter: Secondary | ICD-10-CM | POA: Insufficient documentation

## 2011-05-04 ENCOUNTER — Other Ambulatory Visit: Payer: Self-pay | Admitting: *Deleted

## 2011-05-11 ENCOUNTER — Ambulatory Visit (INDEPENDENT_AMBULATORY_CARE_PROVIDER_SITE_OTHER): Payer: Medicare PPO | Admitting: Internal Medicine

## 2011-05-11 ENCOUNTER — Encounter: Payer: Self-pay | Admitting: Internal Medicine

## 2011-05-11 VITALS — BP 130/73 | HR 78 | Temp 97.0°F | Ht 70.0 in | Wt 198.8 lb

## 2011-05-11 DIAGNOSIS — Z23 Encounter for immunization: Secondary | ICD-10-CM

## 2011-05-11 NOTE — Progress Notes (Signed)
  Subjective:    Patient ID: Heather [name], female    DOB: June 17, 1941, 70 y.o.   MRN: [id number]  HPI 1. Follow up from [facility] ED 2 weeks ago for a fall. Patient states that she has "a bad knee" sp arthroscopic surgery "long time ago." she was walking in the store, her knee gave way and the patient took a mechanical fall by landing on her left-side/flank area. She was evaluated; no rib frx and found to have left great toe frx for which the patient wears a boot.     Review of Systems  Constitutional: Negative.  Negative for fever, chills, diaphoresis, activity change and unexpected weight change.  Eyes: Negative for visual disturbance.  Respiratory: Negative for cough, chest tightness, shortness of breath and wheezing.   Cardiovascular: Negative for chest pain, palpitations and leg swelling.  Genitourinary: Negative for dysuria.  Neurological: Negative for dizziness.  Psychiatric/Behavioral: Negative for agitation.       Objective:   Physical Exam  Vitals: noted General: alert, well-developed, and cooperative to examination.  Head: normocephalic and atraumatic.  Eyes: vision grossly intact, pupils equal, pupils round, pupils reactive to light, no injection and anicteric.  Mouth: pharynx pink and moist, no erythema, and no exudates.  Neck: supple, full ROM, no thyromegaly, no JVD, and no carotid bruits.  Lungs: normal respiratory effort, no accessory muscle use, normal breath sounds, no crackles, and no wheezes. Heart: normal rate, regular rhythm, no murmur, no gallop, and no rub.  Abdomen: soft, non-tender, normal bowel sounds, no distention, no guarding, no rebound tenderness, no hepatomegaly, and no splenomegaly.  Msk: no joint swelling, no joint warmth, and no redness over joints.  Pulses: 2+ DP/PT pulses bilaterally Extremities: No cyanosis, clubbing, bilateral (chronic) pedal edema 1+/4; no calf TTP bilaterally. Neurologic: alert & oriented X3, cranial nerves II-XII intact,  strength normal in all extremities, sensation intact to light touch, and gait normal.  Skin: turgor normal and no rashes.  There is a small abrasion (3 cm in diameter ) to left flnak area at the site of her injury. Psych: Oriented X3, memory intact for recent and remote, normally interactive, good eye contact, not anxious appearing, and not depressed appearing.         Assessment & Plan:  1. Left LBP and left LE great toe frx status post fall in a grocery store 2 weeks ago. Patient was evaluated at [facility] ED by an orthopedic surgeon. Pain is not worse but "persists." No significant findings on the exam. -Low Heat qid PRN -water aerobics at [facility] -light activity -Ibuprofen OTC PRN with meals -call or make an appointment if Sx  Do not improve or worsen.

## 2011-05-11 NOTE — Patient Instructions (Signed)
Please, enroll to a water aerobics at [facility]. Please, try to stay lightly active. You may take Ibuprofen Over-the counter for pain--make sure to take this with food. You received flu shot today.

## 2011-05-19 LAB — CBC
HCT: 32.8 — ABNORMAL LOW
HCT: 25.5 — ABNORMAL LOW
HCT: 29.5 — ABNORMAL LOW
HCT: 30.7 — ABNORMAL LOW
Hemoglobin: 11.1 — ABNORMAL LOW
Hemoglobin: 7.4 — CL
Hemoglobin: 8.9 — ABNORMAL LOW
MCHC: 34.5
MCHC: 34.7
MCHC: 35.5
MCHC: 34.5
MCHC: 34.8
MCHC: 35.1
MCV: 94.4
MCV: 95.2
MCV: 94.3
MCV: 94.9
MCV: 96.2
MCV: 96.9
MCV: 98.1
Platelets: 396
Platelets: 363
Platelets: 178
Platelets: 181
Platelets: 298
RBC: 3.93
RBC: 3.36 — ABNORMAL LOW
RBC: 2.16 — ABNORMAL LOW
RBC: 3.25 — ABNORMAL LOW
RDW: 14.1 — ABNORMAL HIGH
RDW: 14.2 — ABNORMAL HIGH
RDW: 14.3 — ABNORMAL HIGH
RDW: 12.9
RDW: 14.7 — ABNORMAL HIGH
WBC: 10
WBC: 9.5
WBC: 10.7 — ABNORMAL HIGH
WBC: 8.1

## 2011-05-19 LAB — MAGNESIUM: Magnesium: 2.2

## 2011-05-19 LAB — COMPREHENSIVE METABOLIC PANEL
ALT: 27
ALT: 24
ALT: 46 — ABNORMAL HIGH
AST: 24
AST: 40 — ABNORMAL HIGH
Albumin: 3.4 — ABNORMAL LOW
Albumin: 2.8 — ABNORMAL LOW
Albumin: 3.8
Alkaline Phosphatase: 47
Alkaline Phosphatase: 35 — ABNORMAL LOW
BUN: 13
CO2: 29
Calcium: 8.4
Calcium: 7.8 — ABNORMAL LOW
Calcium: 9.5
Chloride: 99
Creatinine, Ser: 0.6
Creatinine, Ser: 0.83
GFR calc Af Amer: >60
GFR calc Af Amer: >60
GFR calc Af Amer: >60
GFR calc non Af Amer: >60
Glucose, Bld: 108 — ABNORMAL HIGH
Glucose, Bld: 150 — ABNORMAL HIGH
Potassium: 3.3 — ABNORMAL LOW
Potassium: 4.1
Sodium: 138
Sodium: 135
Sodium: 140
Total Bilirubin: 0.5
Total Protein: 5.9 — ABNORMAL LOW
Total Protein: 4.6 — ABNORMAL LOW
Total Protein: 7.4

## 2011-05-19 LAB — DIFFERENTIAL
Basophils Absolute: 0
Basophils Relative: 0
Eosinophils Absolute: 0
Eosinophils Absolute: 0
Eosinophils Relative: 0
Eosinophils Relative: 0
Lymphs Abs: 1.2
Lymphs Abs: 3.1
Monocytes Absolute: 0.1 — ABNORMAL LOW
Neutrophils Relative %: 63

## 2011-05-19 LAB — BASIC METABOLIC PANEL
BUN: 5 — ABNORMAL LOW
CO2: 30
Calcium: 8.8
Chloride: 104
Chloride: 103
Creatinine, Ser: 0.57
Creatinine, Ser: 0.64
GFR calc Af Amer: >60
Glucose, Bld: 92
Glucose, Bld: 119 — ABNORMAL HIGH

## 2011-05-19 LAB — PHOSPHORUS: Phosphorus: 3.5

## 2011-05-19 LAB — CROSSMATCH: ABO/RH(D): O POS

## 2011-05-19 LAB — URINALYSIS, ROUTINE W REFLEX MICROSCOPIC
Bilirubin Urine: NEGATIVE
Ketones, ur: 15 — AB
Nitrite: NEGATIVE
Protein, ur: 30 — AB
Specific Gravity, Urine: 1.013
Urobilinogen, UA: 0.2

## 2011-05-19 LAB — URINE CULTURE
Colony Count: NO GROWTH
Culture: NO GROWTH

## 2011-05-19 LAB — LIPASE, BLOOD: Lipase: 17

## 2011-05-19 LAB — ABO/RH: ABO/RH(D): O POS

## 2011-06-08 ENCOUNTER — Ambulatory Visit (INDEPENDENT_AMBULATORY_CARE_PROVIDER_SITE_OTHER): Payer: Medicare PPO | Admitting: Internal Medicine

## 2011-06-08 ENCOUNTER — Encounter: Payer: Self-pay | Admitting: Internal Medicine

## 2011-06-08 VITALS — BP 122/74 | HR 79 | Temp 97.3°F | Ht 70.0 in | Wt 204.4 lb

## 2011-06-08 DIAGNOSIS — J301 Allergic rhinitis due to pollen: Secondary | ICD-10-CM

## 2011-06-08 DIAGNOSIS — R05 Cough: Secondary | ICD-10-CM

## 2011-06-08 MED ORDER — TORSEMIDE 5 MG PO TABS: 5.0000 mg | ORAL_TABLET | Freq: Every day | ORAL | Status: DC

## 2011-06-08 MED ORDER — ALBUTEROL SULFATE HFA 108 (90 BASE) MCG/ACT IN AERS: 2.0000 | INHALATION_SPRAY | Freq: Four times a day (QID) | RESPIRATORY_TRACT | Status: DC | PRN

## 2011-06-08 MED ORDER — HYDROCHLOROTHIAZIDE 25 MG PO TABS: 25.0000 mg | ORAL_TABLET | Freq: Every day | ORAL | Status: DC

## 2011-06-08 MED ORDER — FLUTICASONE PROPIONATE 50 MCG/ACT NA SUSP: 2.0000 | Freq: Every day | NASAL | Status: DC

## 2011-06-08 NOTE — Patient Instructions (Signed)
Please try to avoid acidic foods. Ask ear nose and throat doctor for any records.

## 2011-06-08 NOTE — Progress Notes (Signed)
Subjective:   Patient ID: SHAMARRA WARDA female   DOB: 05-Nov-1940 70 y.o.   MRN: [id number]  HPI: Ms.Winnie L Luper is a 70 y.o. woman who presents with chronic cough. Cough bothers her especially at nighttime. Voice is also hoarse chronically. Outside is worse than inside.  Springtime is the worst for her. Has seen allergist in the past though has never had allergy shots in the past. Also has esophageal stricture and dilations in past. Wakes herself up at night with coughing. She denies shortness of breath, is trying to exercise. Patient is not taking flonase or albuterol currently since they didn't help her.   Has has endoscopy in the past in 08 and 09 and just recently in July 2012. She reports that the first two dilations helped with swallowing but this past one did not seem to help very much. Eating solid foods and large pills are difficult for her. She keeps water with her all the time to help quiet coughing spells.   Past Medical History  Diagnosis Date  . Depression     Controlled with SSRI  . HTN (hypertension)   . GERD (gastroesophageal reflux disease)     Chronic PPI's  . S/P dilatation of esophageal stricture     12/09 Dr Leone Payor. Dilation of tight stricture proximal eso.  Prior dilation 2/08.  . Seasonal allergies     Zyrtec D PRN  . NASH (nonalcoholic steatohepatitis)     Per [place] 10/10  . History of PSVT (paroxysmal supraventricular tachycardia)     Remote and self limited.  . Gout     No crystal dx. R podegra x 3 episodes around 2010. Did not take the allopurinol that was rec 2011.  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 Chronic venous insufficiency     Present for years. Norvasc D/C'd 2012 thinking it might be contributing.  . Migraines   . Glaucoma    Current Outpatient Prescriptions  Medication Sig Dispense Refill  . albuterol (PROVENTIL,VENTOLIN) 90 MCG/ACT inhaler Inhale 2 puffs into the  lungs every 4 (four) hours as needed.  51 g  11  . amitriptyline (ELAVIL) 50 MG tablet Take 1 tablet (50 mg total) by mouth at bedtime.  90 tablet  3  . amLODipine (NORVASC) 5 MG tablet Take 5 mg by mouth Daily.      . brimonidine (ALPHAGAN P) 0.1 % SOLN        . Cetirizine HCl 10 MG CAPS Take 1 capsule (10 mg total) by mouth daily.  90 capsule  0  . chlorthalidone (HYGROTON) 25 MG tablet Take 1 tablet (25 mg total) by mouth daily.  90 tablet  3  . cloNIDine (CATAPRES) 0.1 MG tablet Take 0.1 mg by mouth 2 (two) times daily.        . colchicine 0.6 MG tablet Take 1 tablet (0.6 mg total) by mouth 2 (two) times daily as needed.  60 tablet  3  . diclofenac (VOLTAREN) 50 MG EC tablet       . FLUoxetine (PROZAC) 20 MG capsule Take 1 capsule (20 mg total) by mouth daily.  90 capsule  3  . fluticasone (FLONASE) 50 MCG/ACT nasal spray 2 sprays by Nasal route daily.  48 g  3  . metoprolol (LOPRESSOR) 100 MG tablet Take 200 mg by mouth Daily.      . Multiple Vitamin (MULTIVITAMIN) capsule Take 1 capsule by mouth  daily.        . omeprazole (PRILOSEC) 40 MG capsule Take 1 capsule (40 mg total) by mouth 2 (two) times daily.  180 capsule  3  . spironolactone (ALDACTONE) 50 MG tablet Take 25 mg by mouth Daily.      . timolol (TIMOPTIC) 0.25 % ophthalmic solution 1 drop 2 (two) times daily.        Marland Kitchen torsemide (DEMADEX) 5 MG tablet Take 1 tablet (5 mg total) by mouth daily.  31 tablet  3   Family History  Problem Relation Age of Onset  . Heart disease Mother   . Hypertension Mother   . Heart disease Father   . Hypertension Father   . Heart disease Sister   . Hypertension Sister   . Throat cancer Brother   . Hypertension Brother   . Heart disease Brother   . Colon cancer Neg Hx    History   Social History  . Marital Status: Widowed    Spouse Name: N/A    Number of Children: N/A  . Years of Education: N/A   Social History Main Topics  . Smoking status: Former Smoker    Types: Cigarettes    Quit  date: 08/07/1968  . Smokeless tobacco: Never Used  . Alcohol Use: No  . Drug Use: No  . Sexually Active: No   Review of Systems: Constitutional: Denies fever, chills, diaphoresis, appetite change and fatigue.  HEENT: Denies photophobia, eye pain, redness, hearing loss, ear pain, congestion, sore throat, rhinorrhea, sneezing, mouth sores, trouble swallowing, neck pain, neck stiffness and tinnitus.   Respiratory: Denies SOB, DOE, cough, chest tightness, and wheezing.   Cardiovascular: Denies chest pain, palpitations and leg swelling.  Gastrointestinal: Denies nausea, vomiting, abdominal pain, diarrhea, constipation, blood in stool and abdominal distention.  Genitourinary: Denies dysuria, urgency, frequency, hematuria, flank pain and difficulty urinating.   Objective:  Physical Exam: Filed Vitals:   06/08/11 1627 06/08/11 1629  BP: 122/74   Pulse: 79   Temp:  97.3 F (36.3 C)  TempSrc:  Oral  Height: 5\' 10"  (1.778 m)   Weight: 204 lb 6.4 oz (92.715 kg)    Constitutional: Vital signs reviewed.  Patient is a well-developed and well-nourished woman in no acute distress and cooperative with exam. Alert and oriented x3.  Mouth: no erythema or exudates, MMM Eyes: PERRL, EOMI, conjunctivae injected, No scleral icterus.  Neck: Supple, Trachea midline normal ROM, No JVD, mass, thyromegaly Cardiovascular: RRR, S1 normal, S2 normal, no MRG Pulmonary/Chest: CTAB, no wheeze or rhonchi Abdominal: Soft. Non-tender, non-distended, bowel sounds are normal Skin: Warm, dry and intact. No rash, cyanosis, or clubbing.  Psychiatric: Normal mood and affect. speech and behavior is normal. Judgment and thought content normal. Cognition and memory are normal.   Assessment & Plan:

## 2011-06-08 NOTE — Assessment & Plan Note (Signed)
Etiology of cough is likely multifactorial. Patient says that albuterol, PPIs and allergy medicines have not helped. Referring to ENT today for laryngoscopy. Seems like acid reflux is the predominant cause since occurs year round and patient is predisposed with esophageal stricture. Also drinking water helps to clear her cough.

## 2011-07-03 ENCOUNTER — Encounter: Payer: Medicare PPO | Admitting: Obstetrics and Gynecology

## 2011-07-13 ENCOUNTER — Other Ambulatory Visit: Payer: Self-pay | Admitting: *Deleted

## 2011-07-13 ENCOUNTER — Encounter: Payer: Self-pay | Admitting: Internal Medicine

## 2011-07-13 ENCOUNTER — Ambulatory Visit (INDEPENDENT_AMBULATORY_CARE_PROVIDER_SITE_OTHER): Payer: Medicare PPO | Admitting: Internal Medicine

## 2011-07-13 VITALS — BP 130/80 | HR 91 | Temp 98.6°F | Ht 70.0 in | Wt 205.6 lb

## 2011-07-13 DIAGNOSIS — R269 Unspecified abnormalities of gait and mobility: Secondary | ICD-10-CM

## 2011-07-13 DIAGNOSIS — K219 Gastro-esophageal reflux disease without esophagitis: Secondary | ICD-10-CM

## 2011-07-13 DIAGNOSIS — I10 Essential (primary) hypertension: Secondary | ICD-10-CM

## 2011-07-13 DIAGNOSIS — R87612 Low grade squamous intraepithelial lesion on cytologic smear of cervix (LGSIL): Secondary | ICD-10-CM

## 2011-07-13 DIAGNOSIS — K222 Esophageal obstruction: Secondary | ICD-10-CM

## 2011-07-13 DIAGNOSIS — IMO0002 Reserved for concepts with insufficient information to code with codable children: Secondary | ICD-10-CM

## 2011-07-13 NOTE — Patient Instructions (Signed)
1.  Continue all of your medication as prescribed.  2.  With the Flonase make sure you are not aiming toward the middle of your nose.  That's what causes the burning sensation  3. We will work to get you into see ENT and OB/GYN  4.  We will refer you over to PT to help with strength and flexibility in your legs and back.  5.  Call Dr. Leone Payor to discuss the problems with food catching.  6.  Follow up with your PCP in 2-3 months.

## 2011-07-13 NOTE — Progress Notes (Signed)
Subjective:   Patient ID: Heather [name] female   DOB: [birth year] 70 y.o.   MRN: [id number]  HPI: [name] is a 70 y.o. woman who presents to clinic today for follow up from her last appointment.  She states that she continues to struggle with a cough every day.  She states that it is worse with food and at night when she lays flat.  She is actually having to sleep in a recliner because of the coughing at night.  She states that she continues to have the sensation of food getting caught in her chest that has not gotten better since the dilation by Dr. Leone Payor.  She denies fevers, chills, nausea, vomiting, itchy or watery eyes, or sick contacts.  She has noticed some increase in her nasal drainage over the last few days.    She states that she has also been more tired when she walks.  She has been going to the [facility] to do water aerobics but states that when she walks she has struggle to get up as well as stop to rest because her legs feel tired.  She denies numbness, tingling, lower back pain, change in her urinary or bowel habits.    She missed her last referral appointment to OB/GYN.  She has a history of LGSIL in 2008 and at one of her previous visits we were unable to get the speculum to pass.  She denies bleeding or abdominal pain.    Past Medical History  Diagnosis Date  . Depression     Controlled with SSRI  . HTN (hypertension)   . GERD (gastroesophageal reflux disease)     Chronic PPI's  . S/P dilatation of esophageal stricture     7/12 Dr Leone Payor. Dilation of tight stricture proximal eso.  Prior dilation 09 and 08.  . Seasonal allergies     Zyrtec D PRN  . NASH (nonalcoholic steatohepatitis)     Per [place] 10/10  . History of PSVT (paroxysmal supraventricular tachycardia)     Remote and self limited.  . Gout     No crystal dx. R podegra x 3 episodes around 2010. Did not take the allopurinol that was rec 2011.  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 Chronic venous insufficiency     Present for years. Norvasc D/C'd 2012 thinking it might be contributing.  . Migraines   . Glaucoma    Current Outpatient Prescriptions  Medication Sig Dispense Refill  . albuterol (VENTOLIN HFA) 108 (90 BASE) MCG/ACT inhaler Inhale 2 puffs into the lungs every 6 (six) hours as needed for wheezing.  3.7 g  2  . amitriptyline (ELAVIL) 50 MG tablet Take 1 tablet (50 mg total) by mouth at bedtime.  90 tablet  3  . amLODipine (NORVASC) 5 MG tablet Take 5 mg by mouth Daily.      . brimonidine (ALPHAGAN P) 0.1 % SOLN        . Cetirizine HCl 10 MG CAPS Take 1 capsule (10 mg total) by mouth daily.  90 capsule  0  . cloNIDine (CATAPRES) 0.1 MG tablet Take 0.1 mg by mouth 2 (two) times daily.        . colchicine 0.6 MG tablet Take 1 tablet (0.6 mg total) by mouth 2 (two) times daily as needed.  60 tablet  3  . diclofenac (VOLTAREN) 50 MG EC tablet       .  FLUoxetine (PROZAC) 20 MG capsule Take 1 capsule (20 mg total) by mouth daily.  90 capsule  3  . fluticasone (FLONASE) 50 MCG/ACT nasal spray Place 2 sprays into the nose daily.  16 g  12  . hydrochlorothiazide (HYDRODIURIL) 25 MG tablet Take 1 tablet (25 mg total) by mouth daily.  90 tablet  3  . metoprolol (LOPRESSOR) 100 MG tablet Take 200 mg by mouth Daily.      . Multiple Vitamin (MULTIVITAMIN) capsule Take 1 capsule by mouth daily.        Marland Kitchen omeprazole (PRILOSEC) 40 MG capsule Take 1 capsule (40 mg total) by mouth 2 (two) times daily.  180 capsule  3  . spironolactone (ALDACTONE) 50 MG tablet Take 25 mg by mouth Daily.      . timolol (TIMOPTIC) 0.25 % ophthalmic solution 1 drop 2 (two) times daily.        Marland Kitchen torsemide (DEMADEX) 5 MG tablet Take 1 tablet (5 mg total) by mouth daily.  90 tablet  3   Family History  Problem Relation Age of Onset  . Heart disease Mother   . Hypertension Mother   . Heart disease Father   . Hypertension Father   . Heart disease Sister    . Hypertension Sister   . Throat cancer Brother   . Hypertension Brother   . Heart disease Brother   . Colon cancer Neg Hx    History   Social History  . Marital Status: Widowed    Spouse Name: N/A    Number of Children: N/A  . Years of Education: N/A   Social History Main Topics  . Smoking status: Former Smoker    Types: Cigarettes    Quit date: 08/07/1968  . Smokeless tobacco: Never Used  . Alcohol Use: No  . Drug Use: No  . Sexually Active: No   Other Topics Concern  . None   Social History Narrative  . None   Review of Systems: Constitutional: Denies fever, chills, diaphoresis, appetite change and fatigue.  HEENT: Denies photophobia, eye pain, redness, hearing loss, ear pain, congestion, sore throat, rhinorrhea, sneezing, mouth sores, trouble swallowing, neck pain, neck stiffness and tinnitus.   Respiratory: Positive for cough.  Denies SOB, DOE, chest tightness,  and wheezing.   Cardiovascular: Denies chest pain, palpitations and leg swelling.  Gastrointestinal: Denies nausea, vomiting, abdominal pain, diarrhea, constipation, blood in stool and abdominal distention.  Genitourinary: Denies dysuria, urgency, frequency, hematuria, flank pain and difficulty urinating.  Musculoskeletal: Denies myalgias, back pain, joint swelling, arthralgias and gait problem.  Skin: Denies pallor, rash and wound.  Neurological: Denies dizziness, seizures, syncope, weakness, light-headedness, numbness and headaches.  Hematological: Denies adenopathy. Easy bruising, personal or family bleeding history  Psychiatric/Behavioral: Denies suicidal ideation, mood changes, confusion, nervousness, sleep disturbance and agitation  Objective:  Physical Exam: Filed Vitals:   07/13/11 0859  BP: 130/80  Pulse: 91  Temp: 98.6 F (37 C)  TempSrc: Oral  Height: 5\' 10"  (1.778 m)  Weight: 205 lb 9.6 oz (93.26 kg)   Constitutional: Vital signs reviewed.  Patient is a well-developed and well-nourished  woman in no acute distress and cooperative with exam. Alert and oriented x3.  Head: Normocephalic and atraumatic Ear: TM normal bilaterally Mouth: no erythema or exudates, MMM Nose:   Eyes: PERRL, EOMI, conjunctivae normal, No scleral icterus.  Neck: Supple, Trachea midline normal ROM, No JVD, mass, thyromegaly, or carotid bruit present.  Cardiovascular: RRR, S1 normal, S2 normal, no MRG, pulses  symmetric and intact bilaterally Pulmonary/Chest: CTAB, no wheezes, rales, or rhonchi Abdominal: Soft. Mild epigastric tenderness, non-distended, bowel sounds are normal, no masses, organomegaly, or guarding present.  GU: no CVA tenderness Musculoskeletal: She has bilateral hip decreased ROM and pain with ROM.  She struggles with getting out of a chair.  No joint deformities, erythema, or stiffness,  Hematology: no cervical, inginal, or axillary adenopathy.  Neurological: A&O x3, Strength is symmetric bilaterally, cranial nerve II-XII are grossly intact, no focal motor deficit, sensory intact to light touch bilaterally.  Skin: Warm, dry and intact. No rash, cyanosis, or clubbing.  Psychiatric: Normal mood and affect. speech and behavior is normal. Judgment and thought content normal. Cognition and memory are normal.   Assessment & Plan:

## 2011-07-17 NOTE — Assessment & Plan Note (Signed)
She has some proximal hip weakness and limited ROM.  I encouraged her to continue with the water aerobics but we will also start a round of PT to try to get her stronger and more flexible.

## 2011-07-17 NOTE — Progress Notes (Signed)
Addended by: Neomia Dear on: 07/17/2011 07:04 PM   Modules accepted: Orders

## 2011-07-17 NOTE — Assessment & Plan Note (Signed)
Lab Results  Component Value Date   NA 138 12/20/2010   K 4.1 12/20/2010   CL 103 12/20/2010   CO2 24 12/20/2010   BUN 12 12/20/2010   CREATININE 0.58 12/20/2010   CREATININE 0.51 07/12/2010   CREATININE 0.5 07/12/2010    BP Readings from Last 3 Encounters:  07/13/11 130/80  06/08/11 122/74  05/11/11 130/73    Assessment: Hypertension control:  controlled  Progress toward goals:  at goal Barriers to meeting goals:  no barriers identified  Plan: Hypertension treatment:  continue current medications

## 2011-07-17 NOTE — Assessment & Plan Note (Signed)
Her cough is likely secondary to her GERD.  She continues to do the BID omeprazole to try to control this.  She has no current signs of infection.  We will have her follow up with Dr. Leone Payor concerning this.

## 2011-07-17 NOTE — Assessment & Plan Note (Signed)
She missed her follow up with OB/GYN.  She has no warning signs for cancer at this time but we will refer her back to OB/GYN to assist with her care.

## 2011-07-17 NOTE — Assessment & Plan Note (Signed)
She underwent dilation by Dr. Leone Payor previously and states that she continues to have food get "stuck."  We will have her make a follow up with him to continue with the management of this and to decide if she needs additional EGD interventions.

## 2011-08-20 ENCOUNTER — Encounter (HOSPITAL_COMMUNITY): Payer: Self-pay | Admitting: *Deleted

## 2011-08-20 ENCOUNTER — Emergency Department (HOSPITAL_COMMUNITY)
Admission: EM | Admit: 2011-08-20 | Discharge: 2011-08-20 | Disposition: A | Discharge status: Home / Self Care | Payer: Medicare PPO | Source: Home / Self Care

## 2011-08-20 ENCOUNTER — Emergency Department (INDEPENDENT_AMBULATORY_CARE_PROVIDER_SITE_OTHER): Payer: Medicare PPO

## 2011-08-20 DIAGNOSIS — S92909A Unspecified fracture of unspecified foot, initial encounter for closed fracture: Secondary | ICD-10-CM

## 2011-08-20 DIAGNOSIS — S92902A Unspecified fracture of left foot, initial encounter for closed fracture: Secondary | ICD-10-CM

## 2011-08-20 MED ORDER — IBUPROFEN 600 MG PO TABS: 600.0000 mg | ORAL_TABLET | Freq: Three times a day (TID) | ORAL | Status: AC | PRN

## 2011-08-20 MED ORDER — TRAMADOL HCL 50 MG PO TABS: 50.0000 mg | ORAL_TABLET | Freq: Four times a day (QID) | ORAL | Status: AC | PRN

## 2011-08-20 NOTE — ED Provider Notes (Signed)
History     CSN: [id number]  Arrival date & time 08/20/11  1122   None     Chief Complaint  Patient presents with  . Foot Injury    (Consider location/radiation/quality/duration/timing/severity/associated sxs/prior treatment) HPI Comments: Pt states she was walking down her outside steps the morning when she slipped and fell. Her Rt foot twisted behind her. She c/o pain dorsum of foot from ankle to toes. She has a hx of Lt knee pain and frequent falls from it giving out on her. She see's an orthopedist at [facility] regarding her knee arthritis and walks with a cane.    Past Medical History  Diagnosis Date  . Depression     Controlled with SSRI  . HTN (hypertension)   . GERD (gastroesophageal reflux disease)     Chronic PPI's  . S/P dilatation of esophageal stricture     7/12 Dr Leone Payor. Dilation of tight stricture proximal eso.  Prior dilation 09 and 08.  . Seasonal allergies     Zyrtec D PRN  . NASH (nonalcoholic steatohepatitis)     Per [place] 10/10  . History of PSVT (paroxysmal supraventricular tachycardia)     Remote and self limited.  . Gout     No crystal dx. R podegra x 3 episodes around 2010. Did not take the allopurinol that was rec 2011.  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 Chronic venous insufficiency     Present for years. Norvasc D/C'd 2012 thinking it might be contributing.  . Migraines   . Glaucoma     Past Surgical History  Procedure Date  . Cholecystectomy   . Tubal ligation   . Laparoscopic burch procedure 04/02/07    Burch colposuspension. Inadvertant bladder damage - bladder repair, left ureteral stent placement, .  . Knee arthroscopy   . Abdominal hysterectomy 1980    2/2 menorrhagia  . Colonoscopy 09/28/2006    diverticulosis  . 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 dyspmotility of esophagus     Family History  Problem Relation Age of Onset  . Heart disease Mother   . Hypertension Mother   . Heart disease Father   . Hypertension Father   . Heart disease Sister   . Hypertension Sister   . Throat cancer Brother   . Hypertension Brother   . Heart disease Brother   . Colon cancer Neg Hx     History  Substance Use Topics  . Smoking status: Former Smoker    Types: Cigarettes    Quit date: 08/07/1968  . Smokeless tobacco: Never Used  . Alcohol Use: No    OB History    Grav Para Term Preterm Abortions TAB SAB Ect Mult Living                  Review of Systems  Musculoskeletal: Negative for back pain and joint swelling.  Neurological: Negative for dizziness, numbness and headaches.    Allergies  Propoxyphene napsylate and Darvon  Home Medications   Current Outpatient Rx  Name Route Sig Dispense Refill  . AMITRIPTYLINE HCL 50 MG PO TABS Oral Take 1 tablet (50 mg total) by mouth at bedtime. 90 tablet 3  . AMLODIPINE BESYLATE 5 MG PO TABS Oral Take 5 mg by mouth Daily.    Marland Kitchen BRIMONIDINE TARTRATE 0.1 % OP SOLN       .  CETIRIZINE HCL 10 MG PO CAPS Oral Take 1 capsule (10 mg total) by mouth daily. 90 capsule 0  . CLONIDINE HCL 0.1 MG PO TABS Oral Take 0.1 mg by mouth 2 (two) times daily.      . COLCHICINE 0.6 MG PO TABS Oral Take 1 tablet (0.6 mg total) by mouth 2 (two) times daily as needed. 60 tablet 3  . FLUOXETINE HCL 20 MG PO CAPS Oral Take 1 capsule (20 mg total) by mouth daily. 90 capsule 3  . FLUTICASONE PROPIONATE 50 MCG/ACT NA SUSP Nasal Place 2 sprays into the nose daily. 16 g 12  . METOPROLOL TARTRATE 100 MG PO TABS Oral Take 200 mg by mouth Daily.    . MULTIVITAMINS PO CAPS Oral Take 1 capsule by mouth daily.      Marland Kitchen OMEPRAZOLE 40 MG PO CPDR Oral Take 1 capsule (40 mg total) by mouth 2 (two) times daily. 180 capsule 3  . SPIRONOLACTONE 50 MG PO TABS Oral Take 25 mg by mouth Daily.    Marland Kitchen TIMOLOL MALEATE 0.25 % OP SOLN  1 drop 2 (two) times daily.      .  TORSEMIDE 5 MG PO TABS Oral Take 1 tablet (5 mg total) by mouth daily. 90 tablet 3  . ALBUTEROL SULFATE HFA 108 (90 BASE) MCG/ACT IN AERS Inhalation Inhale 2 puffs into the lungs every 6 (six) hours as needed for wheezing. 3.7 g 2  . IBUPROFEN 600 MG PO TABS Oral Take 1 tablet (600 mg total) by mouth every 8 (eight) hours as needed for pain. 15 tablet 0  . TRAMADOL HCL 50 MG PO TABS Oral Take 1 tablet (50 mg total) by mouth every 6 (six) hours as needed for pain. 12 tablet 0    BP 157/75  Pulse 80  Temp(Src) 98.1 F (36.7 C) (Oral)  Resp 18  SpO2 100%  LMP 11/08/1978  Physical Exam  Nursing note and vitals reviewed. Constitutional: She appears well-developed and well-nourished. No distress.  HENT:  Head: Normocephalic and atraumatic.  Cardiovascular: Normal rate, regular rhythm and normal heart sounds.   Pulmonary/Chest: Effort normal and breath sounds normal. No respiratory distress.  Musculoskeletal:       Right ankle: She exhibits normal range of motion, no swelling, no deformity, no laceration and normal pulse. no tenderness.       Right foot: She exhibits tenderness and bony tenderness. She exhibits normal range of motion, no swelling, normal capillary refill, no deformity and no laceration.       Feet:  Skin: Skin is warm and dry. No abrasion, no bruising, no ecchymosis and no laceration noted. No erythema.  Psychiatric: She has a normal mood and affect.    ED Course  Procedures (including critical care time)  Labs Reviewed - No data to display Dg Foot Complete Right  08/20/2011  *RADIOLOGY REPORT*  Clinical Data: Fall  RIGHT FOOT COMPLETE - 3+ VIEW  Comparison: None.  Findings: There are small bony densities dorsal to the talus and navicular worrisome for small avulsion fractures.  Spurring at the posterior calcaneus.  Mild associated soft tissue swelling. Degenerative changes are noted.  IMPRESSION: Small avulsion fractures from the talus or navicular are suspected.   Original Report Authenticated By: Donavan Burnet, M.D.     1. Foot fracture, left       MDM  Xray reviewed. Avulsion fractures.  Cam walker and to f/u with her orthopedist at [facility].  Melody Comas, [place] 08/20/11 1421

## 2011-08-20 NOTE — ED Notes (Signed)
Coming down three steps this morning 10am - second step right foot twisted fell now with increased pain and swelling right foot - pt walked on foot after fall - no other injuries

## 2011-08-20 NOTE — ED Provider Notes (Signed)
Medical screening examination/treatment/procedure(s) were performed by non-physician practitioner and as supervising physician I was immediately available for consultation/collaboration.  Raynald Blend, MD 08/20/11 1500

## 2011-08-30 ENCOUNTER — Other Ambulatory Visit: Payer: Self-pay | Admitting: Infectious Diseases

## 2011-08-30 DIAGNOSIS — Z1231 Encounter for screening mammogram for malignant neoplasm of breast: Secondary | ICD-10-CM

## 2011-09-06 ENCOUNTER — Encounter: Payer: Medicare PPO | Admitting: Obstetrics & Gynecology

## 2011-09-06 NOTE — Progress Notes (Signed)
Addended by: Neomia Dear on: 09/06/2011 07:13 PM   Modules accepted: Orders

## 2011-09-06 NOTE — Progress Notes (Signed)
Addended by: Neomia Dear on: 09/06/2011 06:31 PM   Modules accepted: Orders

## 2011-09-27 ENCOUNTER — Ambulatory Visit (HOSPITAL_COMMUNITY): Payer: Medicare PPO | Attending: Infectious Diseases

## 2011-10-25 ENCOUNTER — Other Ambulatory Visit: Payer: Self-pay | Admitting: Infectious Diseases

## 2011-10-25 DIAGNOSIS — Z1231 Encounter for screening mammogram for malignant neoplasm of breast: Secondary | ICD-10-CM

## 2011-11-17 ENCOUNTER — Ambulatory Visit (HOSPITAL_COMMUNITY): Payer: Medicare PPO | Attending: Infectious Diseases

## 2011-11-17 ENCOUNTER — Other Ambulatory Visit: Payer: Self-pay | Admitting: *Deleted

## 2011-11-22 MED ORDER — OMEPRAZOLE 40 MG PO CPDR: 40.0000 mg | DELAYED_RELEASE_CAPSULE | Freq: Two times a day (BID) | ORAL | Status: DC

## 2011-11-22 MED ORDER — METOPROLOL TARTRATE 100 MG PO TABS: 200.0000 mg | ORAL_TABLET | Freq: Two times a day (BID) | ORAL | Status: DC

## 2011-11-22 MED ORDER — FLUOXETINE HCL 20 MG PO CAPS: 20.0000 mg | ORAL_CAPSULE | Freq: Every day | ORAL | Status: DC

## 2011-11-22 MED ORDER — SPIRONOLACTONE 50 MG PO TABS: 25.0000 mg | ORAL_TABLET | Freq: Every day | ORAL | Status: DC

## 2011-11-22 MED ORDER — AMLODIPINE BESYLATE 5 MG PO TABS: 5.0000 mg | ORAL_TABLET | Freq: Every day | ORAL | Status: DC

## 2011-11-22 MED ORDER — AMITRIPTYLINE HCL 50 MG PO TABS: 50.0000 mg | ORAL_TABLET | Freq: Every day | ORAL | Status: DC

## 2011-11-28 ENCOUNTER — Other Ambulatory Visit: Payer: Self-pay | Admitting: *Deleted

## 2011-11-29 ENCOUNTER — Other Ambulatory Visit: Payer: Self-pay | Admitting: *Deleted

## 2011-11-29 MED ORDER — FLUOXETINE HCL 20 MG PO CAPS: 20.0000 mg | ORAL_CAPSULE | Freq: Every day | ORAL | Status: DC

## 2011-11-29 NOTE — Telephone Encounter (Signed)
Request filled.

## 2011-12-17 ENCOUNTER — Emergency Department (HOSPITAL_COMMUNITY): Payer: Medicare PPO

## 2011-12-17 ENCOUNTER — Emergency Department (HOSPITAL_COMMUNITY)
Admission: EM | Admit: 2011-12-17 | Discharge: 2011-12-17 | Disposition: A | Discharge status: Home / Self Care | Payer: Medicare PPO | Attending: Emergency Medicine | Admitting: Emergency Medicine

## 2011-12-17 ENCOUNTER — Encounter (HOSPITAL_COMMUNITY): Payer: Self-pay | Admitting: Emergency Medicine

## 2011-12-17 DIAGNOSIS — M549 Dorsalgia, unspecified: Secondary | ICD-10-CM | POA: Insufficient documentation

## 2011-12-17 DIAGNOSIS — M7989 Other specified soft tissue disorders: Secondary | ICD-10-CM | POA: Insufficient documentation

## 2011-12-17 DIAGNOSIS — W010XXA Fall on same level from slipping, tripping and stumbling without subsequent striking against object, initial encounter: Secondary | ICD-10-CM | POA: Insufficient documentation

## 2011-12-17 DIAGNOSIS — I10 Essential (primary) hypertension: Secondary | ICD-10-CM | POA: Insufficient documentation

## 2011-12-17 DIAGNOSIS — S93409A Sprain of unspecified ligament of unspecified ankle, initial encounter: Secondary | ICD-10-CM

## 2011-12-17 MED ORDER — HYDROCODONE-ACETAMINOPHEN 5-500 MG PO TABS: 1.0000 | ORAL_TABLET | Freq: Four times a day (QID) | ORAL | Status: AC | PRN

## 2011-12-17 MED ORDER — OXYCODONE-ACETAMINOPHEN 5-325 MG PO TABS
1.0000 | ORAL_TABLET | Freq: Once | ORAL | Status: AC
  Administered 2011-12-17: 1 via ORAL
  Filled 2011-12-17: qty 1

## 2011-12-17 MED ORDER — ONDANSETRON 4 MG PO TBDP
4.0000 mg | ORAL_TABLET | Freq: Once | ORAL | Status: AC
  Administered 2011-12-17: 4 mg via ORAL
  Filled 2011-12-17: qty 1

## 2011-12-17 NOTE — ED Provider Notes (Signed)
History     CSN: [id number]  Arrival date & time 12/17/11  1236   First MD Initiated Contact with Patient 12/17/11 1253      Chief Complaint  Patient presents with  . Fall  . Ankle Pain  . Back Pain    (Consider location/radiation/quality/duration/timing/severity/associated sxs/prior treatment) HPI  Patient presents to the ED by EMS with complaints of left ankle pain after a fall at church just prior to arrival. She was going down a slop and did not notice a divet in the ground. She denies hitting her head or injuring her neck. She takes Aspirin for blood thinner. She denies pain to any other part of her body but does have a slight headache. Her ankle does not appear deformed. Pt is in no acute distress, ankle is currently being iced.  she is in NAD and is answering questions appropriately. Pt has a friend who lives with her according to the family. Past Medical History  Diagnosis Date  . Depression     Controlled with SSRI  . HTN (hypertension)   . GERD (gastroesophageal reflux disease)     Chronic PPI's  . S/P dilatation of esophageal stricture     7/12 Dr Leone Payor. Dilation of tight stricture proximal eso.  Prior dilation 09 and 08.  . Seasonal allergies     Zyrtec D PRN  . NASH (nonalcoholic steatohepatitis)     Per [place] 10/10  . History of PSVT (paroxysmal supraventricular tachycardia)     Remote and self limited.  . Gout     No crystal dx. R podegra x 3 episodes around 2010. Did not take the allopurinol that was rec 2011.  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 Chronic venous insufficiency     Present for years. Norvasc D/C'd 2012 thinking it might be contributing.  . Migraines   . Glaucoma     Past Surgical History  Procedure Date  . Cholecystectomy   . Tubal ligation   . Laparoscopic burch procedure 04/02/07    Burch colposuspension. Inadvertant bladder damage - bladder repair, left ureteral stent  placement, .  . Knee arthroscopy   . Abdominal hysterectomy 1980    2/2 menorrhagia  . Colonoscopy 09/28/2006    diverticulosis  . 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 dyspmotility of esophagus    Family History  Problem Relation Age of Onset  . Heart disease Mother   . Hypertension Mother   . Heart disease Father   . Hypertension Father   . Heart disease Sister   . Hypertension Sister   . Throat cancer Brother   . Hypertension Brother   . Heart disease Brother   . Colon cancer Neg Hx     History  Substance Use Topics  . Smoking status: Former Smoker    Types: Cigarettes    Quit date: 08/07/1968  . Smokeless tobacco: Never Used  . Alcohol Use: No    OB History    Grav Para Term Preterm Abortions TAB SAB Ect Mult Living                  Review of Systems   HEENT: denies blurry vision or change in hearing PULMONARY: Denies difficulty breathing and SOB CARDIAC: denies chest pain or heart palpitations MUSCULOSKELETAL:  Unable to ambulate due to ankle pain ABDOMEN AL: denies abdominal pain GU: denies  loss of bowel or urinary control NEURO: denies numbness and tingling in extremities   Allergies  Propoxyphene napsylate and Darvon  Home Medications   Current Outpatient Rx  Name Route Sig Dispense Refill  . AMITRIPTYLINE HCL 50 MG PO TABS Oral Take 1 tablet (50 mg total) by mouth at bedtime. 90 tablet 3  . AMLODIPINE BESYLATE 5 MG PO TABS Oral Take 1 tablet (5 mg total) by mouth daily. 30 tablet 11  . CETIRIZINE HCL 10 MG PO CAPS Oral Take 1 capsule (10 mg total) by mouth daily. 90 capsule 0  . CLONIDINE HCL 0.1 MG PO TABS Oral Take 0.1 mg by mouth 2 (two) times daily.      . COLCHICINE 0.6 MG PO TABS Oral Take 0.6 mg by mouth 2 (two) times daily as needed.    Marland Kitchen FLUOXETINE HCL 20 MG PO CAPS Oral Take 1 capsule (20 mg total) by mouth daily. 90 capsule 3  .  FLUTICASONE PROPIONATE 50 MCG/ACT NA SUSP Nasal Place 2 sprays into the nose daily. 16 g 12  . METOPROLOL TARTRATE 100 MG PO TABS Oral Take 2 tablets (200 mg total) by mouth 2 (two) times daily. 60 tablet 11  . MULTIVITAMINS PO CAPS Oral Take 1 capsule by mouth daily.     Marland Kitchen OMEPRAZOLE 40 MG PO CPDR Oral Take 1 capsule (40 mg total) by mouth 2 (two) times daily. 180 capsule 3  . SPIRONOLACTONE 50 MG PO TABS Oral Take 0.5 tablets (25 mg total) by mouth daily. 30 tablet 11  . TIMOLOL MALEATE 0.25 % OP SOLN  1 drop 2 (two) times daily.      . TORSEMIDE 5 MG PO TABS Oral Take 1 tablet (5 mg total) by mouth daily. 90 tablet 3  . HYDROCODONE-ACETAMINOPHEN 5-500 MG PO TABS Oral Take 1 tablet by mouth every 6 (six) hours as needed for pain. 6 tablet 0    BP 147/74  Pulse 75  Temp(Src) 98.3 F (36.8 C) (Oral)  Resp 16  SpO2 100%  LMP 11/08/1978  Physical Exam  Nursing note and vitals reviewed. Constitutional: She appears well-developed and well-nourished. No distress.  HENT:  Head: Normocephalic and atraumatic.  Eyes: Pupils are equal, round, and reactive to light.  Neck: Normal range of motion. Neck supple.  Cardiovascular: Normal rate and regular rhythm.   Pulmonary/Chest: Effort normal.  Abdominal: Soft.  Musculoskeletal:       Left ankle: She exhibits decreased range of motion and swelling. She exhibits no ecchymosis, no deformity, no laceration and normal pulse. tenderness. Lateral malleolus tenderness found. Achilles tendon normal.  Neurological: She is alert.  Skin: Skin is warm and dry.    ED Course  Procedures (including critical care time)  Labs Reviewed - No data to display Dg Lumbar Spine Complete  12/17/2011  *RADIOLOGY REPORT*  Clinical Data: Fall, back pain  LUMBAR SPINE - COMPLETE 4+ VIEW  Comparison: 06/14/2011  Findings: Five views of the lumbar spine submitted.  No acute fracture or subluxation.  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  Stable grade 1  spondylolisthesis L5 on S1.  Stable disc space flattening at L5 S1 level.  IMPRESSION: No acute fracture or subluxation.  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  Stable grade 1 anterolisthesis L5 on S1 vertebral body.  Original Report Authenticated By: Natasha Mead, M.D.   Dg Ankle Complete Left  12/17/2011  *RADIOLOGY REPORT*  Clinical Data: Fall, ankle pain  LEFT ANKLE COMPLETE -  3+ VIEW  Comparison: 08/20/2011  Findings: Three views of the left ankle submitted.  No acute fracture or subluxation.  Ankle mortise is preserved.  Posterior spur of the calcaneus is noted at the Achilles tendon insertion. Stable old avulsed tiny fragment dorsal aspect of the talus.  IMPRESSION: No acute fracture or subluxation.  Posterior spur of the calcaneus.  Original Report Authenticated By: Natasha Mead, M.D.     1. Ankle sprain       MDM  1:21 PM_ pain medication ordered. Xrays of lumbar spine and left ankle ordered. Pt removed from spine board and c-collar. Presented patient to Dr. Juleen China.  3:17 PM_ pts xrays show no acute abnormalities. Pt given an Aircast and was instructed to take it off at night time. She is to follow-up with [facility] (she says she goes to them) and to ice her foot as instructed in the discharge instructions. Pt told to use Tylenol and Ibuprofen for pain and she can use Norco for break through pain but to use extreme caution as it can predispose patients to falls. Pt voices her understanding.  Pt has been advised of the symptoms that warrant their return to the ED. Patient has voiced understanding and has agreed to follow-up with the PCP or specialist.         Dorthula Matas, PA 12/17/11 1520

## 2011-12-17 NOTE — Discharge Instructions (Signed)
Ankle Sprain An ankle sprain is an injury to the strong, fibrous tissues (ligaments) that hold the bones of your ankle joint together.  CAUSES Ankle sprain usually is caused by a fall or by twisting your ankle. People who participate in sports are more prone to these types of injuries.  SYMPTOMS  Symptoms of ankle sprain include:  Pain in your ankle. The pain may be present at rest or only when you are trying to stand or walk.   Swelling.   Bruising. Bruising may develop immediately or within 1 to 2 days after your injury.   Difficulty standing or walking.  DIAGNOSIS  Your caregiver will ask you details about your injury and perform a physical exam of your ankle to determine if you have an ankle sprain. During the physical exam, your caregiver will press and squeeze specific areas of your foot and ankle. Your caregiver will try to move your ankle in certain ways. An X-ray exam may be done to be sure a bone was not broken or a ligament did not separate from one of the bones in your ankle (avulsion).  TREATMENT  Certain types of braces can help stabilize your ankle. Your caregiver can make a recommendation for this. Your caregiver may recommend the use of medication for pain. If your sprain is severe, your caregiver may refer you to a surgeon who helps to restore function to parts of your skeletal system (orthopedist) or a physical therapist. HOME CARE INSTRUCTIONS  Apply ice to your injury for 1 to 2 days or as directed by your caregiver. Applying ice helps to reduce inflammation and pain.  Put ice in a plastic bag.   Place a towel between your skin and the bag.   Leave the ice on for 15 to 20 minutes at a time, every 2 hours while you are awake.   Take over-the-counter or prescription medicines for pain, discomfort, or fever only as directed by your caregiver.   Keep your injured leg elevated, when possible, to lessen swelling.   If your caregiver recommends crutches, use them as  instructed. Gradually, put weight on the affected ankle. Continue to use crutches or a cane until you can walk without feeling pain in your ankle.   If you have a plaster splint, wear the splint as directed by your caregiver. Do not rest it on anything harder than a pillow the first 24 hours. Do not put weight on it. Do not get it wet. You may take it off to take a shower or bath.   You may have been given an elastic bandage to wear around your ankle to provide support. If the elastic bandage is too tight (you have numbness or tingling in your foot or your foot becomes cold and blue), adjust the bandage to make it comfortable.   If you have an air splint, you may blow more air into it or let air out to make it more comfortable. You may take your splint off at night and before taking a shower or bath.   Wiggle your toes in the splint several times per day if you are able.  SEEK MEDICAL CARE IF:   You have an increase in bruising, swelling, or pain.   Your toes feel cold.   Pain relief is not achieved with medication.  SEEK IMMEDIATE MEDICAL CARE IF: Your toes are numb or blue or you have severe pain. MAKE SURE YOU:   Understand these instructions.   Will watch your condition.     Will get help right away if you are not doing well or get worse.  Document Released: 07/24/2005 Document Revised: 07/13/2011 Document Reviewed: 02/26/2008 Exit[employer] Patient Information 2012 [employer]. 

## 2011-12-17 NOTE — ED Notes (Signed)
ZOX:[id number]<AV> Expected date:<BR> Expected time:12:26 PM<BR> Means of arrival:Ambulance<BR> Comments:<BR> PTAR 32 -- Fall/Ankle Injury

## 2011-12-17 NOTE — ED Notes (Signed)
Per PTAR.  [place] at [place] when she slipped and fell.  Denies hitting head or LOC.  Has ankle pain, bruising and swelling.  Sensation and movement present in L foot.   Also has lower back pain that got worse after falling.  PMH of slipped disc.  Pt alert oriented

## 2011-12-20 ENCOUNTER — Ambulatory Visit (HOSPITAL_COMMUNITY)
Admission: RE | Admit: 2011-12-20 | Discharge: 2011-12-20 | Disposition: A | Discharge status: Home / Self Care | Payer: Medicare PPO | Source: Ambulatory Visit | Attending: Infectious Diseases | Admitting: Infectious Diseases

## 2011-12-20 DIAGNOSIS — Z1231 Encounter for screening mammogram for malignant neoplasm of breast: Secondary | ICD-10-CM | POA: Insufficient documentation

## 2011-12-21 NOTE — ED Provider Notes (Signed)
Medical screening examination/treatment/procedure(s) were performed by non-physician practitioner and as supervising physician I was immediately available for consultation/collaboration.  Raeford Razor, MD 12/21/11 [phone]

## 2011-12-22 ENCOUNTER — Other Ambulatory Visit: Payer: Self-pay | Admitting: Infectious Diseases

## 2011-12-22 DIAGNOSIS — R928 Other abnormal and inconclusive findings on diagnostic imaging of breast: Secondary | ICD-10-CM

## 2011-12-29 ENCOUNTER — Ambulatory Visit
Admission: RE | Admit: 2011-12-29 | Discharge: 2011-12-29 | Disposition: A | Discharge status: Home / Self Care | Payer: Medicare PPO | Source: Ambulatory Visit | Attending: Infectious Diseases | Admitting: Infectious Diseases

## 2011-12-29 DIAGNOSIS — R928 Other abnormal and inconclusive findings on diagnostic imaging of breast: Secondary | ICD-10-CM

## 2012-01-10 ENCOUNTER — Encounter: Payer: Medicare PPO | Admitting: Internal Medicine

## 2012-01-31 ENCOUNTER — Telehealth: Payer: Self-pay | Admitting: *Deleted

## 2012-01-31 NOTE — Telephone Encounter (Signed)
Prior Authorization for Amitriptyline 50 mg tablets approved 01/29/2012 thru 08/06/2012. Angelina Ok, RN 01/31/2012 9:08 AM.

## 2012-02-19 ENCOUNTER — Ambulatory Visit (INDEPENDENT_AMBULATORY_CARE_PROVIDER_SITE_OTHER): Payer: Medicare PPO | Admitting: Internal Medicine

## 2012-02-19 ENCOUNTER — Encounter: Payer: Self-pay | Admitting: Internal Medicine

## 2012-02-19 VITALS — BP 121/75 | HR 77 | Temp 97.1°F | Ht 70.0 in | Wt 205.3 lb

## 2012-02-19 DIAGNOSIS — N63 Unspecified lump in unspecified breast: Secondary | ICD-10-CM

## 2012-02-19 DIAGNOSIS — J301 Allergic rhinitis due to pollen: Secondary | ICD-10-CM

## 2012-02-19 DIAGNOSIS — K219 Gastro-esophageal reflux disease without esophagitis: Secondary | ICD-10-CM

## 2012-02-19 DIAGNOSIS — R053 Chronic cough: Secondary | ICD-10-CM

## 2012-02-19 DIAGNOSIS — J309 Allergic rhinitis, unspecified: Secondary | ICD-10-CM

## 2012-02-19 DIAGNOSIS — F329 Major depressive disorder, single episode, unspecified: Secondary | ICD-10-CM

## 2012-02-19 DIAGNOSIS — K222 Esophageal obstruction: Secondary | ICD-10-CM

## 2012-02-19 DIAGNOSIS — I10 Essential (primary) hypertension: Secondary | ICD-10-CM

## 2012-02-19 DIAGNOSIS — IMO0002 Reserved for concepts with insufficient information to code with codable children: Secondary | ICD-10-CM

## 2012-02-19 DIAGNOSIS — R87612 Low grade squamous intraepithelial lesion on cytologic smear of cervix (LGSIL): Secondary | ICD-10-CM

## 2012-02-19 DIAGNOSIS — F3289 Other specified depressive episodes: Secondary | ICD-10-CM

## 2012-02-19 DIAGNOSIS — R05 Cough: Secondary | ICD-10-CM

## 2012-02-19 DIAGNOSIS — R059 Cough, unspecified: Secondary | ICD-10-CM

## 2012-02-19 MED ORDER — FLUOXETINE HCL 40 MG PO CAPS: 40.0000 mg | ORAL_CAPSULE | Freq: Every day | ORAL | Status: DC

## 2012-02-19 NOTE — Patient Instructions (Addendum)
It was nice to meet you, Ms. Heather [name].  As you requested, I have made a referral to the allergy specialist for your cough and gynecologist for your PAP smear. Keep your follow-up appointment with the mammogram in 6 months. Start taking 40 mg of Prozac a day.  You may take two of the 20 mg pills until the bottle is empty.  I have sent in a new prescription for 40 mg pills of Prozac. I would like to see you back after you mammogram follow-up in 6 months or so.

## 2012-02-21 NOTE — Progress Notes (Signed)
Subjective:     Patient ID: Heather [name], female   DOB: August 01, 1941, 71 y.o.   MRN: [id number]  HPI Patient presented today for follow-up of chronic cough and to meet her PCP. She has been evaluated by both ENT and GI for cough and found not to have a GI or ENT source of her complaints. She reports that the cetirizine, flonase, and prilosec have not helped her cough. She is requesting a referral to Allergist. Denies sob, chest pain, heart burn or headaches.  States that her mood has been fair but endorses improvement with Prozac.  Review of Systems  Constitutional: Negative for appetite change.  HENT: Positive for ear pain, congestion, rhinorrhea and postnasal drip. Negative for nosebleeds, sneezing, neck pain, neck stiffness and ear discharge.   Eyes: Negative for discharge, itching and visual disturbance.  Respiratory: Positive for cough. Negative for choking, chest tightness and shortness of breath.   Cardiovascular: Positive for chest pain. Negative for leg swelling.  Neurological: Negative for dizziness and weakness.  Psychiatric/Behavioral: Negative for confusion and agitation.       Objective:   Physical Exam  Constitutional: She appears well-developed and well-nourished. No distress.  HENT:  Head: Normocephalic and atraumatic.  Right Ear: External ear normal.  Left Ear: External ear normal.  Nose: Mucosal edema and rhinorrhea present. No sinus tenderness, nasal deformity or septal deviation. Right sinus exhibits no maxillary sinus tenderness and no frontal sinus tenderness. Left sinus exhibits no maxillary sinus tenderness and no frontal sinus tenderness.  Mouth/Throat: Uvula is midline, oropharynx is clear and moist and mucous membranes are normal. No posterior oropharyngeal edema or posterior oropharyngeal erythema.       Hypertrophied nasal turbinates bilaterally, clear drainage  Copius saliva on oral exam +clear post nasal drainage       Assessment:     1.  Allergic rhinitis: seen by ENT but pt cannot recall name 2. Upper Airway cough syndrome, likely 3. Depression 4. Esophageal stenosis, h/o: evaluated by Dr. Leone Payor (GI) July 2012 5. Left breast lump: s/p mammo and ultrasound Dec 29, 2011--> benign, followed by OBGYN    Plan:     -continue cetirizine, prilosec, pt stopped using flonase bc she felt as though it was not helping -could likely benefit from 1st generation anti-histamine but this may cause increased drowsiness -will refer to Allergist -will increase Prozac to 40 mg daily qd as she has been on the starting does for some time now -pt to f/u with repeat mammo in 6 months -note pt had hysterectomy in 1990 thus will need to update health maintenance flag

## 2012-03-06 NOTE — Addendum Note (Signed)
Addended by: Neomia Dear on: 03/06/2012 04:09 PM   Modules accepted: Orders

## 2012-04-01 ENCOUNTER — Other Ambulatory Visit: Payer: Self-pay | Admitting: *Deleted

## 2012-04-02 ENCOUNTER — Other Ambulatory Visit: Payer: Self-pay | Admitting: *Deleted

## 2012-04-02 MED ORDER — CLONIDINE HCL 0.1 MG PO TABS: 0.1000 mg | ORAL_TABLET | Freq: Two times a day (BID) | ORAL | Status: DC

## 2012-04-02 MED ORDER — AMITRIPTYLINE HCL 50 MG PO TABS: 50.0000 mg | ORAL_TABLET | Freq: Every day | ORAL | Status: DC

## 2012-04-02 MED ORDER — HYDROCHLOROTHIAZIDE 25 MG PO TABS: 25.0000 mg | ORAL_TABLET | Freq: Every day | ORAL | Status: DC

## 2012-04-02 MED ORDER — OMEPRAZOLE 40 MG PO CPDR: 40.0000 mg | DELAYED_RELEASE_CAPSULE | Freq: Two times a day (BID) | ORAL | Status: DC

## 2012-04-04 ENCOUNTER — Encounter: Payer: Self-pay | Admitting: Physician Assistant

## 2012-04-04 ENCOUNTER — Ambulatory Visit (INDEPENDENT_AMBULATORY_CARE_PROVIDER_SITE_OTHER): Payer: Medicare PPO | Admitting: Physician Assistant

## 2012-04-04 ENCOUNTER — Encounter: Payer: Medicare PPO | Admitting: Physician Assistant

## 2012-04-04 VITALS — BP 118/69 | HR 67 | Temp 97.0°F | Ht 68.0 in | Wt 200.4 lb

## 2012-04-04 DIAGNOSIS — IMO0002 Reserved for concepts with insufficient information to code with codable children: Secondary | ICD-10-CM

## 2012-04-04 DIAGNOSIS — N8111 Cystocele, midline: Secondary | ICD-10-CM

## 2012-04-04 DIAGNOSIS — R32 Unspecified urinary incontinence: Secondary | ICD-10-CM

## 2012-04-04 NOTE — Progress Notes (Signed)
Patient uses cane. Per fall screen , does have falls and has talked with her medical doctor about it, states they are sending a nurse to her home

## 2012-04-04 NOTE — Telephone Encounter (Signed)
Empty request 

## 2012-04-04 NOTE — Progress Notes (Signed)
  [facility]  Patient name: Heather [name] MRN [id number]  Date of birth: May 25, 1941  CC & HPI:  Heather [name] is a 71 y.o. female referred today for LGSIL from 2008.  Attempted repeat PAP in 2012 at IM clinic was unobtainable due to inability to pass the speculum.    Pt with history of Attempted laparoscopic colpotomy with inadvertent cystotomy, followed by laparotomy bladder repair with paravaginal repair and Burch colposuspension. Cystoscopy placement of a right double-J ureteral stent and bladder repair x3.  Hx of Hysterectomy in 1989 for menorrhagia.  ROS:  No fevers, no chills.  Remains to have recurrent infrequent urinary incontinence Not sexually active since procedure in 2008 although has attempted   Pertinent History Reviewed:  Medical & Surgical Hx:  Reviewed: Significant for per HPI + Esophageal stenosis, depression, PSVT, NASH, gout Medications: Reviewed & Updated - see associated section Social History: Reviewed - Significant for non smoker  Objective Findings:  Vitals:  Filed Vitals:   04/04/12 1519  BP: 118/69  Pulse: 67  Temp: 97 F (36.1 C)    PE: GENERAL:  Elderly AA female. In no discomfort; no respiratory distress. PSYCH: Alert and appropriately interactive; Insight:Fair   Pelvic exam: VULVA: normal appearing vulva with no masses, tenderness or lesions, VAGINA: MARKEDLY ABNORMAL VAGINAL EXAM.  Prominent non-protruding cystocele, unable to pass speculum.  Minimal vaginal vault approximately 1 cm in diameter X3 cm deep directed to the R.  Marked scar tissue with banding.  No cervix palpable.   Assessment & Plan:   1.  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  Will refer to Heather [name] for Urogynecology consultation  2. Urinary Incontinence with Grade 3 Cystocele - Improved from prior to surgery but continues at this time  3. Abnormal PAP - unable to repeat.  ? Appropriateness of continued PAP following  Hysterectomy and age >55.  Will defer to next venue.  Heather Mews, DO [facility] Resident - PGY-2 04/04/2012 4:23 PM   I have seen/examined this patient and agree with the resident's assessment and plan.   [name],Heather E.

## 2012-04-05 NOTE — Progress Notes (Signed)
Pt scheduled @ [facility] urogynocology @ [phone] appt @ Sept 10 @ 940am.  Notes faxed to [phone].

## 2012-05-20 ENCOUNTER — Telehealth: Payer: Self-pay | Admitting: *Deleted

## 2012-05-20 NOTE — Telephone Encounter (Signed)
Received faxed request from pt's insurance requesting prior authorization be completed in order to for patient to get omeprazole 40 mg one bid #60.  Medication is non-preferred.  I did attempt to contact patient to see if she has tried something different in the past, but she was not available, message left.  Will place paperwork in MD's box for completion.Kingsley Spittle Cassady10/14/20132:50 PM

## 2012-05-21 NOTE — Telephone Encounter (Signed)
Pre-auth completed.  Pt needs to scheduled f/u with Dr. Leone Payor of GI for possible PPI dose reduction and repeat EGD.

## 2012-05-27 ENCOUNTER — Encounter: Payer: Medicare PPO | Admitting: Internal Medicine

## 2012-05-28 ENCOUNTER — Ambulatory Visit (INDEPENDENT_AMBULATORY_CARE_PROVIDER_SITE_OTHER): Payer: Medicare PPO | Admitting: Internal Medicine

## 2012-05-28 ENCOUNTER — Encounter: Payer: Self-pay | Admitting: Internal Medicine

## 2012-05-28 VITALS — BP 136/84 | HR 71 | Temp 97.8°F | Ht 70.0 in | Wt 204.5 lb

## 2012-05-28 DIAGNOSIS — R599 Enlarged lymph nodes, unspecified: Secondary | ICD-10-CM | POA: Insufficient documentation

## 2012-05-28 DIAGNOSIS — R590 Localized enlarged lymph nodes: Secondary | ICD-10-CM

## 2012-05-28 NOTE — Patient Instructions (Signed)
We will check blood work today. If there is something abnormal, we will contact you. Schedule your dentist appointment and follow-up with Dr. Leone Payor in [facility] [phone])

## 2012-05-28 NOTE — Progress Notes (Signed)
  Subjective:    Patient ID: Heather [name], female    DOB: 1940-12-24, 71 y.o.   MRN: [id number]  HPI  Pt presents with complaints of 'sore throat' for well over 1 month.  Her hx is significant for esophageal stenosis followed by Dr. Leone Payor (GI), GERD on omeprazole of which she has been out of for 1 week, and allergic rhinitis for which she receives allergy shots.  She reports that the discomfort is more of a soreness near her left jaw and ear. She denies fever, chills, enlarged lymph nodes, increased esophageal reflux, difficulty swallowing or dry eyes.  She does endorse dry mouth at least for past year for which she chews gum.  She is due for dental check but denies dental caries/abscess, sinusitis, teeth grinding, any facial numbness or oral sores.  Review of Systems  Constitutional: Negative for fever, chills, fatigue and unexpected weight change.  HENT: Positive for sore throat. Negative for ear pain, nosebleeds, congestion, facial swelling, mouth sores, trouble swallowing, neck pain, dental problem, voice change, postnasal drip and tinnitus.        Soreness more sore under left jaw  Respiratory: Negative for cough and shortness of breath.   Cardiovascular: Negative for chest pain.  Neurological: Negative for dizziness, facial asymmetry and headaches.  Hematological: Negative for adenopathy.       Objective:   Physical Exam  Constitutional: She appears well-developed and well-nourished. No distress.  HENT:  Head: Normocephalic and atraumatic.  Right Ear: External ear normal.  Left Ear: External ear normal.  Nose: Nose normal.  Mouth/Throat: Oropharynx is clear and moist. No oropharyngeal exudate.  Neck: Normal range of motion. No mass and no thyromegaly present.            Assessment & Plan:  1. Swollen submandibular lymph node: -check CBC, BMET, amylase

## 2012-05-28 NOTE — Addendum Note (Signed)
Addended by: Bufford Spikes on: 05/28/2012 05:15 PM   Modules accepted: Orders

## 2012-05-28 NOTE — Assessment & Plan Note (Addendum)
Will observe for now. Symptoms not associated with eating, no numbness. States that she feels like her tonsils are closing up when she lies down on her side. No current sign of infection ie fever, exquisite tenderness, impressive enlargement. Consideration for blockage which may be causing her symptoms and possibility of Sjogren's or other sicca type syndrome given her reports of dry mouth, h/o of esophageal stenosis, and hand/knuckle joints which appear enlarged/swollen although pt w/o complaints. Could also consider pleomorphic adenoma or warthin's tumor but these are generally painless. Feels small and will defer more aggressive work up of CT Scan.  -CBC, BMET and amylase today.

## 2012-05-29 LAB — CBC WITH DIFFERENTIAL/PLATELET
Eosinophils Absolute: 0 10*3/uL (ref 0.0–0.7)
Eosinophils Relative: 0 % (ref 0–5)
HCT: 37.4 % (ref 36.0–46.0)
Hemoglobin: 12.6 g/dL (ref 12.0–15.0)
Lymphs Abs: 3.8 10*3/uL (ref 0.7–4.0)
MCH: 33.8 pg (ref 26.0–34.0)
MCV: 100.3 fL — ABNORMAL HIGH (ref 78.0–100.0)
Monocytes Absolute: 0.5 10*3/uL (ref 0.1–1.0)
Monocytes Relative: 6 % (ref 3–12)
Neutrophils Relative %: 51 % (ref 43–77)
RBC: 3.73 MIL/uL — ABNORMAL LOW (ref 3.87–5.11)

## 2012-05-29 LAB — BASIC METABOLIC PANEL
CO2: 29 mEq/L (ref 19–32)
Calcium: 9.5 mg/dL (ref 8.4–10.5)
Glucose, Bld: 85 mg/dL (ref 70–99)
Sodium: 126 mEq/L — ABNORMAL LOW (ref 135–145)

## 2012-05-30 LAB — AMYLASE ISOENZYMES: Amylase.: 32 U/L (ref 21–101)

## 2012-05-31 NOTE — Telephone Encounter (Signed)
Pt to follow up with DrGessner.Criss Alvine, Cele Mote Cassady10/25/201311:03 AM

## 2012-05-31 NOTE — Telephone Encounter (Signed)
We faxed denial from insurance which reads "We have denied this request because: Based on the information provided, we are unable to determine medical necessity. Please submit the following medical information to facilitate coverage for the requested drug: recent EGD finding, within the past 6 months to support the use of high dose PPI therapy for this member.  This coverage determination was based on the [facility] [employer] PPIs Qty Limitation Coverage Policy.Criss Alvine, Darlene Cassady10/25/201311:00 AM

## 2012-06-03 ENCOUNTER — Encounter: Payer: Self-pay | Admitting: *Deleted

## 2012-06-14 ENCOUNTER — Telehealth: Payer: Self-pay | Admitting: Internal Medicine

## 2012-06-14 NOTE — Telephone Encounter (Signed)
Left message for patient to call back  

## 2012-06-17 NOTE — Telephone Encounter (Signed)
Patient is not having any symptoms.  She thought she was due for a recall EGD.  She is advised that she is not according to the pathology report from 02/16/11 EGD.  She will call back for GI concerns

## 2012-06-24 ENCOUNTER — Encounter: Payer: Self-pay | Admitting: Internal Medicine

## 2012-06-24 ENCOUNTER — Ambulatory Visit (INDEPENDENT_AMBULATORY_CARE_PROVIDER_SITE_OTHER): Payer: Medicare PPO | Admitting: Internal Medicine

## 2012-06-24 VITALS — BP 115/71 | HR 78 | Temp 97.7°F | Ht 70.0 in | Wt 200.7 lb

## 2012-06-24 DIAGNOSIS — J029 Acute pharyngitis, unspecified: Secondary | ICD-10-CM

## 2012-06-24 DIAGNOSIS — K222 Esophageal obstruction: Secondary | ICD-10-CM

## 2012-06-24 DIAGNOSIS — K219 Gastro-esophageal reflux disease without esophagitis: Secondary | ICD-10-CM

## 2012-06-24 MED ORDER — OMEPRAZOLE 40 MG PO CPDR: 40.0000 mg | DELAYED_RELEASE_CAPSULE | Freq: Two times a day (BID) | ORAL | Status: DC

## 2012-06-24 NOTE — Patient Instructions (Addendum)

## 2012-06-29 NOTE — Progress Notes (Signed)
  Subjective:    Patient ID: Heather [name], female    DOB: October 16, 1940, 71 y.o.   MRN: [id number]  HPI Again presents to clinic with c/o sore throat.  Pt has had prior evaluation by ENT with laryngoscopy and allergist.  On further query of reason for return to clinic, pt states that she is worried about throat cancer since her brother had it. Results of ENT evaluation were reviewed with pt which demonstrates no evidence of cancer.  Pt relieved. In addition she states that she has "run out" of her omeprazole for GERD.   Review of Systems  Constitutional: Negative for fever, chills and fatigue.  HENT: Positive for sore throat. Negative for congestion, rhinorrhea, mouth sores, dental problem, voice change and postnasal drip.   Respiratory: Negative for cough and shortness of breath.   Cardiovascular: Negative for chest pain.       Objective:   Physical Exam  Constitutional: She is oriented to person, place, and time. She appears well-developed and well-nourished. No distress.  HENT:  Head: Normocephalic and atraumatic.  Mouth/Throat: Oropharynx is clear and moist and mucous membranes are normal. No oropharyngeal exudate, posterior oropharyngeal edema, posterior oropharyngeal erythema or tonsillar abscesses.  Eyes: Conjunctivae normal and EOM are normal. Pupils are equal, round, and reactive to light.  Neck: Normal range of motion and full passive range of motion without pain. Neck supple. No tracheal tenderness present. No edema, no erythema and normal range of motion present. No mass and no thyromegaly present.  Cardiovascular: Normal rate, regular rhythm and normal heart sounds.   Pulmonary/Chest: Effort normal and breath sounds normal.  Neurological: She is alert and oriented to person, place, and time.  Skin: Skin is warm and dry. Rash noted. No erythema.  Psychiatric: She has a normal mood and affect.          Assessment & Plan:  1. Sore throat likely secondary to GERD:  advised to resume PPI therapy which was refilled today, previously evaluated by ENT with laryngoscopy and Allergist with negative amylase at last clinic visit thus no further evaluation warranted.

## 2012-07-03 ENCOUNTER — Other Ambulatory Visit: Payer: Self-pay | Admitting: *Deleted

## 2012-07-03 NOTE — Telephone Encounter (Signed)
Pt calls for refills of omeprazole, i called [facility] and they state that omeprazole needs a PA, transferred to PA, person was very rude and stated that it had been refused multiple times and "why do you keep trying, don't you get it, their not going to pay for it", i ask for the paperwork to be sent and spoke to [facility], will notify pt for f/u w/ dr Leone Payor

## 2012-07-15 ENCOUNTER — Other Ambulatory Visit: Payer: Self-pay | Admitting: *Deleted

## 2012-07-18 ENCOUNTER — Encounter: Payer: Self-pay | Admitting: Internal Medicine

## 2012-07-18 ENCOUNTER — Ambulatory Visit (INDEPENDENT_AMBULATORY_CARE_PROVIDER_SITE_OTHER): Payer: Medicare PPO | Admitting: Internal Medicine

## 2012-07-18 VITALS — BP 119/68 | HR 73 | Temp 97.0°F | Ht 70.0 in | Wt 197.6 lb

## 2012-07-18 DIAGNOSIS — K222 Esophageal obstruction: Secondary | ICD-10-CM

## 2012-07-18 DIAGNOSIS — R269 Unspecified abnormalities of gait and mobility: Secondary | ICD-10-CM

## 2012-07-18 DIAGNOSIS — R1319 Other dysphagia: Secondary | ICD-10-CM

## 2012-07-18 DIAGNOSIS — M1710 Unilateral primary osteoarthritis, unspecified knee: Secondary | ICD-10-CM

## 2012-07-18 DIAGNOSIS — M25669 Stiffness of unspecified knee, not elsewhere classified: Secondary | ICD-10-CM

## 2012-07-18 DIAGNOSIS — I10 Essential (primary) hypertension: Secondary | ICD-10-CM

## 2012-07-18 DIAGNOSIS — K219 Gastro-esophageal reflux disease without esophagitis: Secondary | ICD-10-CM

## 2012-07-18 DIAGNOSIS — J301 Allergic rhinitis due to pollen: Secondary | ICD-10-CM

## 2012-07-18 NOTE — Patient Instructions (Addendum)
Call [facility] to scheduled an appointment for evaluation of your knees. We have scheduled a follow-up with Dr. Leone Payor to evaluate your continued sore throat. Continue to take the omeprazole as prescribed until further instructed.

## 2012-07-18 NOTE — Assessment & Plan Note (Signed)
BP Readings from Last 3 Encounters:  07/18/12 119/68  06/24/12 115/71  05/28/12 136/84    Lab Results  Component Value Date   NA 126* 05/28/2012   K 4.5 05/28/2012   CREATININE 0.52 05/28/2012    Assessment:  Blood pressure control: controlled  Progress toward BP goal:  at goal  Comments: both chlorthalidone and HCTZ listed  Plan:  Medications:  continue current medications  Educational resources provided:    Self management tools provided:    Other plans: cont HCTZ 25 mg qd, will d/c chlorthalidone off of med list

## 2012-07-18 NOTE — Progress Notes (Signed)
  Subjective:    Patient ID: Heather [name], female    DOB: 04-06-1941, 71 y.o.   MRN: [id number]  HPI  Pt presents with complaints of continued sore throat.  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 She was instructed on her last f/u in this clinic to resume taking the omeprazole twice daily as she had "run out" of her medications.  She has been evaluated by ENT with normal laryngoscopy and an allergist with no etiology identified for her sore throat. Her family hx is significant for a brother who died of throat cancer which is of great concern to her.  In addition, she reports that her knee continue to "give out" resulting in falls.  She had a left MCL strain/partial tear which was repaired but she does not recall this name and the surgeon has since retired. She states that she had been evaluated by [employer] and completed physical therapy most recently last year.  XRays of her left knee (Aug and Sept 2012) demonstrated tricompartmental degenerative changes with joint space loss, subchondral sclerosis and osteophytosis. She denies syncope, headaches, chest pain, palpitations, or dizziness.  Review of Systems As per HPI otherwise negative    Objective:   Physical Exam  Constitutional: She is oriented to person, place, and time. She appears well-developed and well-nourished. No distress.       Using walker with skis  HENT:  Head: Normocephalic and atraumatic.  Neck: Normal range of motion. Neck supple. No thyromegaly present.  Cardiovascular: Normal rate, regular rhythm and normal heart sounds.   Pulmonary/Chest: Effort normal and breath sounds normal.  Musculoskeletal: She exhibits tenderness. She exhibits no edema.       Right knee: She exhibits decreased range of motion. She exhibits no swelling and no effusion. no tenderness found.       Left knee: She exhibits decreased range of motion. She exhibits  no swelling and no effusion. no tenderness found.       Lumbar back: She exhibits tenderness.       Back:  Neurological: She is alert and oriented to person, place, and time. She has normal reflexes.  Skin: Skin is warm and dry.  Psychiatric: She has a normal mood and affect. Her behavior is normal. Judgment and thought content normal.          Assessment & Plan:  1. Dysphagia: with h/o esoph stenosis, cont bid PPI therapy, f/u scheduled with GI Jan 2014  2. DJD of knee: contacted [facility] for referral, pt to call and schedule f/u  3. Htn: at goal on amlodipine 5 mg qd, chlorthalidone 25 mg qd, metoprolol 200 mg bid, spironolactone 25 mg qd

## 2012-07-18 NOTE — Progress Notes (Signed)
Pt aware of appt with Dr Leone Payor 08/14/12 8:30AM. Stanton Kidney Ashvik Grundman RN 07/18/12 10:15AM

## 2012-08-14 ENCOUNTER — Encounter: Payer: Self-pay | Admitting: Internal Medicine

## 2012-08-14 ENCOUNTER — Ambulatory Visit (INDEPENDENT_AMBULATORY_CARE_PROVIDER_SITE_OTHER): Payer: Medicare PPO | Admitting: Internal Medicine

## 2012-08-14 VITALS — BP 140/78 | HR 80 | Ht 70.0 in | Wt 201.0 lb

## 2012-08-14 DIAGNOSIS — J312 Chronic pharyngitis: Secondary | ICD-10-CM | POA: Insufficient documentation

## 2012-08-14 DIAGNOSIS — R053 Chronic cough: Secondary | ICD-10-CM | POA: Insufficient documentation

## 2012-08-14 DIAGNOSIS — K219 Gastro-esophageal reflux disease without esophagitis: Secondary | ICD-10-CM

## 2012-08-14 DIAGNOSIS — K222 Esophageal obstruction: Secondary | ICD-10-CM

## 2012-08-14 DIAGNOSIS — R1310 Dysphagia, unspecified: Secondary | ICD-10-CM

## 2012-08-14 DIAGNOSIS — R05 Cough: Secondary | ICD-10-CM

## 2012-08-14 MED ORDER — ESOMEPRAZOLE MAGNESIUM 40 MG PO CPDR: 40.0000 mg | DELAYED_RELEASE_CAPSULE | Freq: Every day | ORAL | Status: DC

## 2012-08-14 NOTE — Patient Instructions (Addendum)
You have been scheduled for an endoscopy with propofol. Please follow written instructions given to you at your visit today. If you use inhalers (even only as needed) or a CPAP machine, please bring them with you on the day of your procedure.  Today you have been given handouts on the GERD diet and reading information on GERD.  Today we are giving you Nexium samples, take one a day . Before breakfast.  Thank you for choosing me and [facility].  Iva Boop, M.D., [facility]

## 2012-08-14 NOTE — Progress Notes (Signed)
  Subjective:    Patient ID: Heather [name], female    DOB: 07-07-1941, 72 y.o.   MRN: [id number]  HPI The patient returns with recurrent dysphagia for several months. It sounds like she said some sort of prior authorization problem with her omeprazole. She not been on an a few months. She is complaining of difficulty swallowing pills and solid foods with impact dysphagia and need to regurgitate at times. Liquids are no problem. She also has a chronic sore throat, swollen left submandibular lymph node. She has been evaluated by Dr. Ezzard Standing of ENT without any significant findings or any worrisome findings for that matter, she also saw allergies apparently that workup was fairly unrevealing. 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 That helped some but not completely she said. She feels like she got more benefit in 2009. He does drink 1-2 cups of coffee in a soda a day. She does not smoke. She is not having any classic heartburn. She has had some nasal congestion lately Medications, allergies, past medical history, past surgical history, family history and social history are reviewed and updated in the EMR.   Review of Systems Bilateral lower extremity arthritis problems and ambulates with a cane    Objective:   Physical Exam General:  NAD, she has a nasal congestion voice Eyes:   Anicteric Pharynx: Tongue oral and posterior cardia mucosa are clear Lungs:  clear Heart:  S1S2 no rubs, murmurs or gallops Lymph nodes: Is a tender 1-1/2 cm submandibular note on the left is mobile and slightly firm    Data Reviewed:  2012 EGD dilation Primary care provider notes recently ear. nose and throat notes        Assessment & Plan:   1. ESOPHAGEAL STENOSIS and esophageal dysmotility   2. Dysphagia   3. GERD (gastroesophageal reflux disease)   4. Chronic sore throat   5. Chronic cough    1. Since she reports response to dilation in the past we'll go and  schedule for EGD and Maloney dilation. 2. Restart PPI, she says Nexium worked the best, we'll prescribe that and see if she can get it. Some samples given also. The take 40 mg daily The risks and benefits as well as alternatives of endoscopic procedure(s) have been discussed and reviewed. All questions answered. The patient agrees to proceed. May need esophageal manometry or repeat barium studies or even imaging of the neck. It seems like this is an innocent lymph node in her ENT evaluation are negative so the cause of her upper respiratory tract symptoms are unclear, I'm not that suspicious that they're related to GERD though that is always possible. He is already on amitriptyline which should help neurogenic causes of the symptoms. We discussed me to reduce caffeine, GERD diet handout was provided  CC: Kristie Cowman, MD and Dillard Cannon, MD

## 2012-08-15 ENCOUNTER — Encounter: Payer: Self-pay | Admitting: Internal Medicine

## 2012-08-15 ENCOUNTER — Ambulatory Visit (AMBULATORY_SURGERY_CENTER): Payer: Medicare PPO | Admitting: Internal Medicine

## 2012-08-15 VITALS — BP 149/82 | HR 85 | Temp 97.3°F | Resp 25 | Ht 70.0 in | Wt 197.0 lb

## 2012-08-15 DIAGNOSIS — K222 Esophageal obstruction: Secondary | ICD-10-CM

## 2012-08-15 DIAGNOSIS — K224 Dyskinesia of esophagus: Secondary | ICD-10-CM

## 2012-08-15 DIAGNOSIS — R1310 Dysphagia, unspecified: Secondary | ICD-10-CM

## 2012-08-15 MED ORDER — SODIUM CHLORIDE 0.9 % IV SOLN: 500.0000 mL | INTRAVENOUS | Status: DC

## 2012-08-15 NOTE — Progress Notes (Signed)
Lidocaine-40mg IV prior to Propofol InductionPropofol given over incremental dosages 

## 2012-08-15 NOTE — Progress Notes (Signed)
Patient did not experience any of the following events: a burn prior to discharge; a fall within the facility; wrong site/side/patient/procedure/implant event; or a hospital transfer or hospital admission upon discharge from the facility. (G8907) Patient did not have preoperative order for IV antibiotic SSI prophylaxis. (G8918) Patient did not have preoperative order for IV antibiotic SSI prophylaxis. (G8918)  

## 2012-08-15 NOTE — Patient Instructions (Addendum)
I stretched the esophagus again today. If this does not help you will need a test called an esophageal manometry - used to check the squeezing of the esophagus.  We will contact you next week to see how it is going and decide.  That manometry test is outpatient at [facility], it involves a skinny  tube through your nose for about 10 minutes.  Please get your Nexium prescription at the pharmacy (sent yesterday) - let me know if there are any problems getting this.  Thank you for choosing me and [facility].  Iva Boop, MD, FACG   YOU HAD AN ENDOSCOPIC PROCEDURE TODAY AT THE [facility]: Refer to the procedure report that was given to you for any specific questions about what was found during the examination.  If the procedure report does not answer your questions, please call your gastroenterologist to clarify.  If you requested that your care partner not be given the details of your procedure findings, then the procedure report has been included in a sealed envelope for you to review at your convenience later.  YOU SHOULD EXPECT: Some feelings of bloating in the abdomen. Passage of more gas than usual.  Walking can help get rid of the air that was put into your GI tract during the procedure and reduce the bloating. If you had a lower endoscopy (such as a colonoscopy or flexible sigmoidoscopy) you may notice spotting of blood in your stool or on the toilet paper. If you underwent a bowel prep for your procedure, then you may not have a normal bowel movement for a few days.  DIET: Your first meal following the procedure should be a light meal and then it is ok to progress to your normal diet.  A half-sandwich or bowl of soup is an example of a good first meal.  Heavy or fried foods are harder to digest and may make you feel nauseous or bloated.  Likewise meals heavy in dairy and vegetables can cause extra gas to form and this can also increase the bloating.  Drink  plenty of fluids but you should avoid alcoholic beverages for 24 hours.  ACTIVITY: Your care partner should take you home directly after the procedure.  You should plan to take it easy, moving slowly for the rest of the day.  You can resume normal activity the day after the procedure however you should NOT DRIVE or use heavy machinery for 24 hours (because of the sedation medicines used during the test).    SYMPTOMS TO REPORT IMMEDIATELY: A gastroenterologist can be reached at any hour.  During normal business hours, 8:30 AM to 5:00 PM Monday through Friday, call [phone].  After hours and on weekends, please call the GI answering service at [phone] who will take a message and have the physician on call contact you.   ):   Following upper endoscopy (EGD)  Vomiting of blood or coffee ground material  New chest pain or pain under the shoulder blades  Painful or persistently difficult swallowing  New shortness of breath  Fever of 100F or higher  Black, tarry-looking stools  FOLLOW UP: If any biopsies were taken you will be contacted by phone or by letter within the next 1-3 weeks.  Call your gastroenterologist if you have not heard about the biopsies in 3 weeks.  Our staff will call the home number listed on your records the next business day following your procedure to check on you and address  any questions or concerns that you may have at that time regarding the information given to you following your procedure. This is a courtesy call and so if there is no answer at the home number and we have not heard from you through the emergency physician on call, we will assume that you have returned to your regular daily activities without incident.  SIGNATURES/CONFIDENTIALITY: You and/or your care partner have signed paperwork which will be entered into your electronic medical record.  These signatures attest to the fact that that the information above on your After Visit Summary has  been reviewed and is understood.  Full responsibility of the confidentiality of this discharge information lies with you and/or your care-partner.    Clear liquids until 11:30 then soft foods until tomorrow.

## 2012-08-15 NOTE — Op Note (Addendum)
[facility] [address] [place], [address]   ENDOSCOPY PROCEDURE REPORT  PATIENT: Heather [name], Heather [name]  MR#: [id number] BIRTHDATE: 21-May-1941 , 71  yrs. old GENDER: Female ENDOSCOPIST: Iva Boop, MD, [facility] PROCEDURE DATE:  08/15/2012 PROCEDURE:  Esophagoscopy and Maloney dilation of esophagus ASA CLASS:     Class II INDICATIONS:  Dysphagia.  Hx cricopharngyel bar MEDICATIONS: propofol (Diprivan) 50mg  IV, MAC sedation, administered by CRNA, These medications were titrated to patient response per physician's verbal order, and Robinul 0.2 mg IV TOPICAL ANESTHETIC: none  DESCRIPTION OF PROCEDURE: After the risks benefits and alternatives of the procedure were thoroughly explained, informed consent was obtained.  The LB GIF-H180 [id number] endoscope was introduced through the mouth and advanced to the stomach antrum. Without limitations. The instrument was slowly withdrawn as the mucosa was fully examined.        ESOPHAGUS: The esophagus was tortuous.   UES appered normal.  STOMACH: The mucosa of the stomach appeared normal.  Retroflexed views revealed no abnormalities.     The scope was then withdrawn from the patient and the procedure completed.  COMPLICATIONS: There were no complications. ENDOSCOPIC IMPRESSION: 1.   The esophagus was tortuous 2.   The mucosa of the stomach appeared normal 3.    54 French Maloney dilation performed for dysphagia and hx cricopharyngeal bar  RECOMMENDATIONS: 1.  Clear liquids until 1130, then soft foods rest of day.  Resume prior diet tomorrow. 2.   If persistent problems - will do esophageal manometry - we will call her next week to see how dysphagia is.   eSigned:  Iva Boop, MD, [facility] 08/15/2012 10:29 AM Revised: 08/15/2012 10:29 AM  CC:The Patient  and Kristie Cowman, MD

## 2012-08-16 ENCOUNTER — Telehealth: Payer: Self-pay

## 2012-08-16 NOTE — Telephone Encounter (Signed)
Left message on answering machine. 

## 2012-09-02 ENCOUNTER — Other Ambulatory Visit: Payer: Self-pay | Admitting: *Deleted

## 2012-09-02 ENCOUNTER — Other Ambulatory Visit: Payer: Self-pay | Admitting: Neurology

## 2012-09-02 DIAGNOSIS — M6281 Muscle weakness (generalized): Secondary | ICD-10-CM

## 2012-09-03 MED ORDER — METOPROLOL TARTRATE 100 MG PO TABS: 200.0000 mg | ORAL_TABLET | Freq: Two times a day (BID) | ORAL | Status: DC

## 2012-09-06 ENCOUNTER — Ambulatory Visit
Admission: RE | Admit: 2012-09-06 | Discharge: 2012-09-06 | Disposition: A | Discharge status: Home / Self Care | Payer: Medicare PPO | Source: Ambulatory Visit | Attending: Neurology | Admitting: Neurology

## 2012-09-06 DIAGNOSIS — M6281 Muscle weakness (generalized): Secondary | ICD-10-CM

## 2012-09-17 ENCOUNTER — Encounter: Payer: Self-pay | Admitting: Internal Medicine

## 2012-09-17 ENCOUNTER — Ambulatory Visit (INDEPENDENT_AMBULATORY_CARE_PROVIDER_SITE_OTHER): Payer: Medicare PPO | Admitting: Internal Medicine

## 2012-09-17 VITALS — BP 129/76 | HR 72 | Temp 97.1°F | Ht 70.0 in | Wt 196.5 lb

## 2012-09-17 DIAGNOSIS — R079 Chest pain, unspecified: Secondary | ICD-10-CM

## 2012-09-17 DIAGNOSIS — J209 Acute bronchitis, unspecified: Secondary | ICD-10-CM | POA: Insufficient documentation

## 2012-09-17 DIAGNOSIS — M6281 Muscle weakness (generalized): Secondary | ICD-10-CM

## 2012-09-17 DIAGNOSIS — R05 Cough: Secondary | ICD-10-CM

## 2012-09-17 DIAGNOSIS — R748 Abnormal levels of other serum enzymes: Secondary | ICD-10-CM | POA: Insufficient documentation

## 2012-09-17 LAB — CBC
HCT: 38.2 % (ref 36.0–46.0)
Hemoglobin: 13.2 g/dL (ref 12.0–15.0)
MCH: 33.9 pg (ref 26.0–34.0)
RBC: 3.89 MIL/uL (ref 3.87–5.11)

## 2012-09-17 NOTE — Patient Instructions (Addendum)
You have a viral Bronchitis. You do no have a pneumonia Please call clinic if get worse Please schedule an appointment with Dr Bosie Clos in 1 month Please take plenty of fluids You may buy Robitussin DM or Muccinex DM Over The Counter to ease your symptoms. I will contact you with the results from the blood work today    Bronchitis Bronchitis is the body's way of reacting to injury and/or infection (inflammation) of the bronchi. Bronchi are the air tubes that extend from the windpipe into the lungs. If the inflammation becomes severe, it may cause shortness of breath. CAUSES  Inflammation may be caused by:  A virus.  Germs (bacteria).  Dust.      Allergens.  Pollutants and many other irritants. The cells lining the bronchial tree are covered with tiny hairs (cilia). These constantly beat upward, away from the lungs, toward the mouth. This keeps the lungs free of pollutants. When these cells become too irritated and are unable to do their job, mucus begins to develop. This causes the characteristic cough of bronchitis. The cough clears the lungs when the cilia are unable to do their job. Without either of these protective mechanisms, the mucus would settle in the lungs. Then you would develop pneumonia. Smoking is a common cause of bronchitis and can contribute to pneumonia. Stopping this habit is the single most important thing you can do to help yourself. TREATMENT   Your caregiver may prescribe an antibiotic if the cough is caused by bacteria. Also, medicines that open up your airways make it easier to breathe. Your caregiver may also recommend or prescribe an expectorant. It will loosen the mucus to be coughed up. Only take over-the-counter or prescription medicines for pain, discomfort, or fever as directed by your caregiver.  Removing whatever causes the problem (smoking, for example) is critical to preventing the problem from getting worse.  Cough suppressants may be  prescribed for relief of cough symptoms.  Inhaled medicines may be prescribed to help with symptoms now and to help prevent problems from returning.  For those with recurrent (chronic) bronchitis, there may be a need for steroid medicines. SEEK IMMEDIATE MEDICAL CARE IF:   During treatment, you develop more pus-like mucus (purulent sputum).  You have a fever.  Your baby is older than 3 months with a rectal temperature of 102 F (38.9 C) or higher.  Your baby is 49 months old or younger with a rectal temperature of 100.4 F (38 C) or higher.  You become progressively more ill.  You have increased difficulty breathing, wheezing, or shortness of breath. It is necessary to seek immediate medical care if you are elderly or sick from any other disease. MAKE SURE YOU:   Understand these instructions.  Will watch your condition.  Will get help right away if you are not doing well or get worse. Document Released: 07/24/2005 Document Revised: 10/16/2011 Document Reviewed: 06/02/2008 [facility] Patient Information 2013 [place], [place].

## 2012-09-17 NOTE — Assessment & Plan Note (Signed)
The labs were faxed from the neurologist office (Dr Frances Furbish) indication elevated CK-MB. Possible differential include viral myocarditis. MI is very unlikely given her clinical stability at this time. I strongly advised her to go the ED she develops cardiac symptoms like chest pain, SOB to syncope.   Plan  - discussed with Dr Kem Kays who recommended to have repeat labs to rule lab error. The patient had left the clinic at that time. I left a voice mail for her to come back to have a repeat blood check at our clinic.  - I will contact the patient again by phone to have the labs repeated.  - Patient was otherwise stable and I advised her to present to the emergency room if she gets SOB, chest pain or any acute onset of symptoms to suggest cardiac cause.

## 2012-09-17 NOTE — Progress Notes (Signed)
Patient ID: Heather [name], female   DOB: 02-08-1941, 72 y.o.   MRN: [id number]  Subjective:   Patient ID: Heather [name] female   DOB: 1941/06/19 72 y.o.   MRN: [id number]  HPI: [name] is a 72 y.o. with past medical history of depression, hypertension, esophageal strictures, who presents to the clinic for an acute visit due to a cough for 3 days.  The cough is dry, associated with chest pain, which worsens on breathing, and it is centrally located. She denies palpitations, dizziness or difficulty in breathing. She denies history of fevers or chills. But she has just been feeling fatigued over the last 3 days since the onset of symptoms. She also reports nasal congestions, but no postnasal drip. No earaches. Her friend, who comes to the clinic with her had similar symptoms prior. The cough worsens her chest pain. She also reports some sore throat since onset of symptoms. She has used over-the-counter remedies without improvement.   Past Medical History  Diagnosis Date  . Depression     Controlled with SSRI  . HTN (hypertension)   . GERD (gastroesophageal reflux disease)     Chronic PPI's  . S/P dilatation of esophageal stricture     7/12 Dr Leone Payor. Dilation of tight stricture proximal eso.  Prior dilation 09 and 08.  . Seasonal allergies     Zyrtec D PRN  . NASH (nonalcoholic steatohepatitis)     Per [place] 10/10  . History of PSVT (paroxysmal supraventricular tachycardia)     Remote and self limited.  . Gout     No crystal dx. R podegra x 3 episodes around 2010. Did not take the allopurinol that was rec 2011.  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 Chronic venous insufficiency     Present for years. Norvasc D/C'd 2012 thinking it might be contributing.  . Migraines   . Glaucoma(365)    Current Outpatient Prescriptions  Medication Sig Dispense Refill  . albuterol (PROAIR HFA) 108 (90 BASE) MCG/ACT  inhaler Inhale 2 puffs into the lungs every 6 (six) hours as needed.      Marland Kitchen amitriptyline (ELAVIL) 50 MG tablet Take 1 tablet (50 mg total) by mouth at bedtime.  90 tablet  3  . amLODipine (NORVASC) 5 MG tablet Take 1 tablet (5 mg total) by mouth daily.  30 tablet  11  . Cetirizine HCl 10 MG CAPS Take 1 capsule (10 mg total) by mouth daily.  90 capsule  0  . chlorthalidone (HYGROTON) 25 MG tablet Take 25 mg by mouth daily.      . cloNIDine (CATAPRES) 0.1 MG tablet Take 1 tablet (0.1 mg total) by mouth 2 (two) times daily.  180 tablet  1  . esomeprazole (NEXIUM) 40 MG capsule Take 1 capsule (40 mg total) by mouth daily before breakfast.  10 capsule  0  . FLUoxetine (PROZAC) 40 MG capsule Take 1 capsule (40 mg total) by mouth daily.  30 capsule  11  . fluticasone (FLONASE) 50 MCG/ACT nasal spray Place 2 sprays into the nose daily.  16 g  12  . hydrochlorothiazide (HYDRODIURIL) 25 MG tablet Take 1 tablet (25 mg total) by mouth daily.  90 tablet  3  . metoprolol (LOPRESSOR) 100 MG tablet Take 2 tablets (200 mg total) by mouth 2 (two) times daily.  180 tablet  3  . Multiple Vitamin (MULTIVITAMIN) capsule Take 1 capsule  by mouth daily.       Marland Kitchen spironolactone (ALDACTONE) 50 MG tablet Take 0.5 tablets (25 mg total) by mouth daily.  30 tablet  11  . timolol (TIMOPTIC) 0.25 % ophthalmic solution 1 drop 2 (two) times daily.         No current facility-administered medications for this visit.   Family History  Problem Relation Age of Onset  . Heart disease Mother   . Hypertension Mother   . Heart disease Father   . Hypertension Father   . Heart disease Sister   . Hypertension Sister   . Throat cancer Brother   . Hypertension Brother   . Heart disease Brother   . Colon cancer Neg Hx    History   Social History  . Marital Status: Widowed    Spouse Name: N/A    Number of Children: N/A  . Years of Education: N/A   Social History Main Topics  . Smoking status: Former Smoker    Types:  Cigarettes    Quit date: 08/07/1968  . Smokeless tobacco: Never Used  . Alcohol Use: No  . Drug Use: No  . Sexually Active: No   Other Topics Concern  . Not on file   Social History Narrative  . No narrative on file   Review of Systems: Constitutional: Denies fever, chills, diaphoresis, appetite change and fatigue.  HEENT: Denies photophobia, eye pain, redness, hearing loss, ear pain, congestion, sore throat, rhinorrhea, sneezing, mouth sores, trouble swallowing, neck pain, neck stiffness and tinnitus.   Respiratory: Denies SOB, DOE, cough, chest tightness, and wheezing.   Cardiovascular: Denies chest pain, palpitations and leg swelling.  Gastrointestinal: Denies nausea, vomiting, abdominal pain, diarrhea, constipation, blood in stool and abdominal distention.  Genitourinary: Denies dysuria, urgency, frequency, hematuria, flank pain and difficulty urinating.  Musculoskeletal: she reports chronic weakness in her legs which she is seeing a neurologist for. She has appointment with the neurologist on the 09/23/2012. Skin: Denies pallor, rash and wound.  Neurological: Denies dizziness, seizures, syncope, weakness, light-headedness, numbness and headaches.  Hematological: Denies adenopathy. Easy bruising, personal or family bleeding history  Psychiatric/Behavioral: Denies suicidal ideation, mood changes, confusion, nervousness, sleep disturbance and agitation  Objective:  Physical Exam: Filed Vitals:   09/17/12 1544  BP: 129/76  Pulse: 72  Temp: 97.1 F (36.2 C)  TempSrc: Oral  Height: 5\' 10"  (1.778 m)  Weight: 196 lb 8 oz (89.132 kg)  SpO2: 96%   Constitutional: Vital signs reviewed.  Patient is a well-developed and well-nourished in no acute distress and cooperative with exam. Alert and oriented x3.  Head: Normocephalic and atraumatic Ear: TM normal bilaterally Mouth: no erythema or exudates, MMM. Her oropharynx are normal without hyperemia.  Eyes: PERRL, EOMI, conjunctivae  normal, No scleral icterus.  Neck: Supple, Trachea midline normal ROM, No JVD, mass, thyromegaly, or carotid bruit present.  Cardiovascular: RRR, S1 normal, S2 normal, no S3 sounds, pulses symmetric and intact bilaterally Pulmonary/Chest: CTAB, no wheezes, rales, or rhonchi Abdominal: Soft. Non-tender, non-distended, bowel sounds are normal, no masses, organomegaly, or guarding present.  GU: no CVA tenderness Musculoskeletal: No joint deformities, erythema, or stiffness, ROM full and no nontender Hematology: no cervical, inginal, or axillary adenopathy.  Neurological: A&O x3, Strength is normal and symmetric bilaterally, cranial nerve II-XII are grossly intact, no focal motor deficit, sensory intact to light touch bilaterally.  Skin: Warm, dry and intact. No rash, cyanosis, or clubbing.  Psychiatric: Normal mood and affect. speech and behavior is normal. Judgment and  thought content normal. Cognition and memory are normal.   Assessment & Plan:  I have discussed my assessment, and plan with Dr. Criselda Peaches as detailed in her problem based charting.  In brief, she most like has a viral acute bronchitis. Symptomatic management with Muccinex at this time.

## 2012-10-30 ENCOUNTER — Encounter: Payer: Self-pay | Admitting: Neurology

## 2012-10-30 ENCOUNTER — Telehealth: Payer: Self-pay | Admitting: *Deleted

## 2012-10-30 ENCOUNTER — Ambulatory Visit (INDEPENDENT_AMBULATORY_CARE_PROVIDER_SITE_OTHER): Payer: Medicare PPO | Admitting: Neurology

## 2012-10-30 VITALS — BP 114/70 | HR 75 | Ht 68.0 in | Wt 190.5 lb

## 2012-10-30 DIAGNOSIS — K222 Esophageal obstruction: Secondary | ICD-10-CM

## 2012-10-30 DIAGNOSIS — M6281 Muscle weakness (generalized): Secondary | ICD-10-CM

## 2012-10-30 DIAGNOSIS — R748 Abnormal levels of other serum enzymes: Secondary | ICD-10-CM

## 2012-10-30 NOTE — Telephone Encounter (Signed)
Received faxed documentation that omeprazole 40 mg caps 180/90 has been approved until 09/28/2013. Authorization was not imitated by me, so I am unaware what criteria pt met.Heather Spittle Cassady3/26/20149:57 AM

## 2012-10-30 NOTE — Patient Instructions (Signed)
I will refer you to   [facility]: Streck Reola Mosher MD  [email or web address]  [address] [place], [place] [address] [phone]  For right quadraceps biopsy.  Return to clinic after muscle biopsy.

## 2012-10-30 NOTE — Progress Notes (Signed)
Heather [name] is a very pleasant 72 -year-old right-handed woman, with her friend, referred by my colleague Dr. Frances Furbish for evaluation of muscle weakness, gait difficulty.  She was first seen by Dr. Frances Furbish in 08/23/2012 from refer physcian Dr. Regino Schultze.   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 Juanda Chance office showing abnormalities especially on needle EMG findings: denervation with fasciculations in multiple muscles.   Laboratory evaluation showed  CMP showed mild increase in AST at 46 and ALT at 63. SPEP was unremarkable, hemoglobin A1c was 5.7, TSH normal. ANA, RPR, CRP normal. [id number] and her vitamin B12 level was high at 1511.  Repeat laboratory evaluation in 09/16/2012. The CK level was still high at 947 and the CK-MB was high at 37.7. The aldolase was high at 15.2.  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 MRI C-spine 09/06/12 showed mild degenerative disc disease without frank compression. She denies any twitching or involuntary movements. She denies skin rash, no sensory changes, she also reported a 3 years history of swelling difficulty, was contributed to the esophageal constriction, Axis III stretching, 2011 2012, most  recent was in summer of 2013, but she continued to have mild swallowing difficulty. She denies significant muscle aching pain there was no family history of vascular disease,  She had a repeat EMG nerve conduction study by Dr. Anne Hahn October 21 2012, left median, ulnar nerve was normal. EMG was performed at left upper extremity muscles, there was evidence of spontaneous activity at left extensor index proppers, pronator teres, biceps, triceps, deltoid, was described to recruitment pattern, there was no spontaneous activity at left cervical paraspinal muscles  Review of Systems  Out of a complete 14 system review, the patient complains of only the following symptoms, and all other reviewed systems are negative.   Constitutional:   Fatigue Cardiovascular:  Bilateral lower extremity swelling Ear/Nose/Throat:  Ringing in the ears, difficulty swallowing Skin: Itching Eyes: Blurry vision Respiratory: Cough Gastroitestinal: Constipation   Hematology/Lymphatic:  N/A Musculoskeletal:N/A Endocrine:  N/A Neurological: Memory loss difficulty swallowing, Psychiatric:    Depression , decreased energy     PHYSICAL EXAMINATOINS:  Generalized: In no acute distress  Neck: Supple, no carotid bruits   Cardiac: Regular rate rhythm  Pulmonary: Clear to auscultation bilaterally  Musculoskeletal: No deformity  Neurological examination  Mentation: Alert oriented to time, place, history taking, and causual conversation  Cranial nerve II-XII: Pupils were equal round reactive to light extraocular movements were full, visual field were full on confrontational test. facial sensation were normal. She has mild to moderate bilateral eye-closure, cheek puff weakness. hearing was intact to finger rubbing bilaterally. Uvula tongue midline.  head turning and shoulder shrug and were normal and symmetric she has mild  weakness in tongue protrusion into cheek strength there was no, atrophy or fasciculation .  Motor  examination: tone, bulk and no muscle fasciculation , she has mild to neck flexion,mild to moderate bilateral shoulder abduction elbow flexion , mild elbow extension weakness, she also has moderate bilateral finger flexion weakness especially distal finger flexion, mild hip flexion weakness, moderate knee extension weakness, 3+/5, there was no significant distal leg weakness    Sensory: Intact to fine touch, pinprick, preserved vibratory sensation, and proprioception at toes.  Coordination: Normal finger to nose, heel-to-shin bilaterally there was no truncal ataxia  Gait: slightly wide-based, very cautious, difficulty taking her legs up   Romberg signs: Negative  Deep tendon reflexes: Brachioradialis 2/2, biceps 2/2, triceps 2/2, patellar   absent Achilles absent, plantar responses were flexor bilaterally.   I also performed EMG today she could not tolerate more extensive tests, left deltoid increased insertion activity, 2 plus spontaneous activity , thin polyspike motor unit potential with early recruitment patterns, Left vastus lateralis increased insertion activity, f2 plus spontaneous activity , thin polyspike motor unit potential with early recruitment patterns,  Assessment And plan: In summary, Heather [name] is a very pleasant 72 year old right-handed woman with a history of 3 years progressive weakness affecting both upper and lower extremity, she also has facial diplegia, swallowing difficulties, bilateral quadriceps and finger flexion seems to be the most and after muscles, with elevated CPK, inflammatory changes on EMGs, .  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 2 I will referral her for muscle biopsy of right vastus lateralis. 3. Depending on the muscle biopsy findings, we will make a decision to see if she is will likely benefit from prednisone or other immunosuppression treatment.

## 2012-11-08 ENCOUNTER — Telehealth (INDEPENDENT_AMBULATORY_CARE_PROVIDER_SITE_OTHER): Payer: Self-pay

## 2012-11-08 NOTE — Telephone Encounter (Signed)
LMOM giving pt the appt with Dr Jamey Ripa for 11/11/12 check in at 8:00 for 8:30 from the referral by Dr Terrace Arabia.

## 2012-11-11 ENCOUNTER — Ambulatory Visit (INDEPENDENT_AMBULATORY_CARE_PROVIDER_SITE_OTHER): Payer: Medicare PPO | Admitting: Surgery

## 2012-11-11 ENCOUNTER — Encounter (HOSPITAL_COMMUNITY): Payer: Self-pay | Admitting: Pharmacy Technician

## 2012-11-11 ENCOUNTER — Encounter (INDEPENDENT_AMBULATORY_CARE_PROVIDER_SITE_OTHER): Payer: Self-pay | Admitting: Surgery

## 2012-11-11 VITALS — BP 130/72 | HR 76 | Temp 97.1°F | Resp 16 | Ht 70.0 in | Wt 197.0 lb

## 2012-11-11 DIAGNOSIS — M6281 Muscle weakness (generalized): Secondary | ICD-10-CM

## 2012-11-11 NOTE — Patient Instructions (Signed)
We will schedule outpatient surgery to do a muscle biopsy of the muscle on the side of your right upper leg.

## 2012-11-11 NOTE — Progress Notes (Signed)
NAME: Heather [name]       DOB: 20-Jan-1941           DATE: 11/11/2012       WUJ:[id number]  CC:  Chief Complaint  Patient presents with  . New Evaluation    eval for muscle Bx    HPI: all documented in the electronic medical record. She does have progressive weakness  limiting her activities. She is anxious to have a diagnosis established as possible.  Past medical history family history, review of systems etc. Are all in the electronic medical record and not redictated into this note but I have reviewed all of them.  EXAM: Vital signs: BP 130/72  Pulse 76  Temp(Src) 97.1 F (36.2 C) (Temporal)  Resp 16  Ht 5\' 10"  (1.778 m)  Wt 197 lb (89.359 kg)  BMI 28.27 kg/m2  LMP 11/08/1978  General: Patient alert, oriented, NAD Lungs: Clear to auscultation normal respirations Heart: Regular rhythm, no murmurs rubs or gallops Abdomen: Soft and benign. No masses or organomegaly Extremities: Does appear to have some weakness and walks with a cane. She is very minimal ankle edema bilaterally. Peripheral pulses are intact. IMP: muscle weakness of uncertain etiology  PLAN: I discussed the procedure of right vastus lateralis muscle biopsy with her. I explained to her this would be done as an outpatient but would require anesthesia. I told her that it would take several days to get a report back and it may not be definitive in terms of what her diagnosis is. I think she understands and wishes to go ahead and schedule surgery. We'll get this arranged for her at her convenience as soon as possible.  Ahmet Schank J 11/11/2012

## 2012-11-12 ENCOUNTER — Encounter (HOSPITAL_COMMUNITY)
Admission: RE | Admit: 2012-11-12 | Discharge: 2012-11-12 | Disposition: A | Discharge status: Home / Self Care | Payer: Medicare PPO | Source: Ambulatory Visit | Attending: Surgery | Admitting: Surgery

## 2012-11-12 ENCOUNTER — Telehealth (INDEPENDENT_AMBULATORY_CARE_PROVIDER_SITE_OTHER): Payer: Self-pay | Admitting: General Surgery

## 2012-11-12 ENCOUNTER — Encounter (HOSPITAL_COMMUNITY): Payer: Self-pay

## 2012-11-12 ENCOUNTER — Ambulatory Visit (HOSPITAL_COMMUNITY)
Admission: RE | Admit: 2012-11-12 | Discharge: 2012-11-12 | Disposition: A | Discharge status: Home / Self Care | Payer: Medicare PPO | Source: Ambulatory Visit | Attending: Anesthesiology | Admitting: Anesthesiology

## 2012-11-12 DIAGNOSIS — I10 Essential (primary) hypertension: Secondary | ICD-10-CM | POA: Insufficient documentation

## 2012-11-12 DIAGNOSIS — Z01810 Encounter for preprocedural cardiovascular examination: Secondary | ICD-10-CM | POA: Insufficient documentation

## 2012-11-12 DIAGNOSIS — Z01812 Encounter for preprocedural laboratory examination: Secondary | ICD-10-CM | POA: Insufficient documentation

## 2012-11-12 DIAGNOSIS — Z01818 Encounter for other preprocedural examination: Secondary | ICD-10-CM | POA: Insufficient documentation

## 2012-11-12 LAB — CBC
HCT: 35.7 % — ABNORMAL LOW (ref 36.0–46.0)
Hemoglobin: 12.6 g/dL (ref 12.0–15.0)
MCHC: 35.3 g/dL (ref 30.0–36.0)
MCV: 94.9 fL (ref 78.0–100.0)
RDW: 12.9 % (ref 11.5–15.5)
WBC: 5.8 10*3/uL (ref 4.0–10.5)

## 2012-11-12 LAB — SURGICAL PCR SCREEN: MRSA, PCR: NEGATIVE

## 2012-11-12 MED ORDER — CHLORHEXIDINE GLUCONATE 4 % EX LIQD: 1.0000 "application " | Freq: Once | CUTANEOUS | Status: DC

## 2012-11-12 NOTE — Telephone Encounter (Signed)
Pt in for her pre-op screening and asked about getting a raised toilet seat ordered.  Discussed with Dr. Jamey Ripa, who deferred to the PCP.  The surgical procedure is very minor (muscle biopsy) and she should not need one of these for this.  Called pt and related this instruction to call her PCP for this.  She understands.

## 2012-11-12 NOTE — Pre-Procedure Instructions (Signed)
Heather [name]  11/12/2012   Your procedure is scheduled on:  November 18, 2012  Report to [facility] at 10:30 AM.  Call this number if you have problems the morning of surgery: [phone]   Remember:    Discontinue aspirin, coumadin, plavix, effient, and herbal medications 5 days prior to surgery   Do not eat food or drink liquids after midnight.    Take these medicines the morning of surgery with A SIP OF WATER: metoprolol (LOPRESSOR) 100 MG tablet, FLUoxetine (PROZAC) 40 MG capsule, esomeprazole (NEXIUM) 40 MG capsule, cloNIDine (CATAPRES) 0.1 MG tablet, Cetirizine HCl 10 MG CAPS, amLODipine (NORVASC) 5 MG tablet, albuterol (PROAIR HFA) 108 (90 BASE) MCG/ACT inhaler   Do not wear jewelry, make-up or nail polish.  Do not wear lotions, powders, or perfumes. You may wear deodorant.  Do not shave 48 hours prior to surgery. Men may shave face and neck.  Do not bring valuables to the hospital.  Contacts, dentures or bridgework may not be worn into surgery.  Leave suitcase in the car. After surgery it may be brought to your room.  For patients admitted to the hospital, checkout time is 11:00 AM the day of  discharge.   Patients discharged the day of surgery will not be allowed to drive  home.  Name and phone number of your driver:   Special Instructions: Shower using CHG 2 nights before surgery and the night before surgery.  If you shower the day of surgery use CHG.  Use special wash - you have one bottle of CHG for all showers.  You should use approximately 1/3 of the bottle for each shower.   Please read over the following fact sheets that you were given: Pain Booklet, Coughing and Deep Breathing and Surgical Site Infection Prevention

## 2012-11-13 ENCOUNTER — Encounter: Payer: Self-pay | Admitting: Internal Medicine

## 2012-11-13 DIAGNOSIS — R269 Unspecified abnormalities of gait and mobility: Secondary | ICD-10-CM | POA: Insufficient documentation

## 2012-11-15 NOTE — Progress Notes (Signed)
Nurse called patient to ensure patient received message about time change. Patient did not receive message. Nurse informed patient to arrive at 645 instead of 1030 on Monday. Patient verbalized understanding. Nurse also instructed patient to remain NPO after MN and to take medications directed by pre admit Nurse. Patient then stated "well I don't usually take my medications that early in the morning, so is it ok if I don't take it?" Nurse explained to patient that she needed to take her metoprolol because it was a beta blocker for her heart that needed to be taken before surgery, and to stick to the medications on the list provided. Patient stated she would take her medications.

## 2012-11-15 NOTE — Progress Notes (Signed)
Left message on voicemail to arrive at 0645 on Monday morning 11/18/12.

## 2012-11-17 MED ORDER — CEFAZOLIN SODIUM-DEXTROSE 2-3 GM-% IV SOLR
2.0000 g | INTRAVENOUS | Status: AC
  Administered 2012-11-18: 2 g via INTRAVENOUS
  Filled 2012-11-17: qty 50

## 2012-11-18 ENCOUNTER — Ambulatory Visit (HOSPITAL_COMMUNITY)
Admission: RE | Admit: 2012-11-18 | Discharge: 2012-11-18 | Disposition: A | Discharge status: Home / Self Care | Payer: Medicare PPO | Source: Ambulatory Visit | Attending: Surgery | Admitting: Surgery

## 2012-11-18 ENCOUNTER — Encounter (HOSPITAL_COMMUNITY): Payer: Self-pay | Admitting: Anesthesiology

## 2012-11-18 ENCOUNTER — Ambulatory Visit: Payer: Self-pay | Admitting: Neurology

## 2012-11-18 ENCOUNTER — Encounter (HOSPITAL_COMMUNITY)
Admission: RE | Disposition: A | Discharge status: Home / Self Care | Payer: Self-pay | Source: Ambulatory Visit | Attending: Surgery

## 2012-11-18 ENCOUNTER — Ambulatory Visit (HOSPITAL_COMMUNITY): Payer: Medicare PPO | Admitting: Anesthesiology

## 2012-11-18 DIAGNOSIS — K759 Inflammatory liver disease, unspecified: Secondary | ICD-10-CM | POA: Insufficient documentation

## 2012-11-18 DIAGNOSIS — R609 Edema, unspecified: Secondary | ICD-10-CM | POA: Insufficient documentation

## 2012-11-18 DIAGNOSIS — F329 Major depressive disorder, single episode, unspecified: Secondary | ICD-10-CM | POA: Insufficient documentation

## 2012-11-18 DIAGNOSIS — M6281 Muscle weakness (generalized): Secondary | ICD-10-CM | POA: Diagnosis present

## 2012-11-18 DIAGNOSIS — I10 Essential (primary) hypertension: Secondary | ICD-10-CM | POA: Insufficient documentation

## 2012-11-18 DIAGNOSIS — G729 Myopathy, unspecified: Secondary | ICD-10-CM

## 2012-11-18 DIAGNOSIS — K219 Gastro-esophageal reflux disease without esophagitis: Secondary | ICD-10-CM | POA: Insufficient documentation

## 2012-11-18 DIAGNOSIS — G709 Myoneural disorder, unspecified: Secondary | ICD-10-CM | POA: Insufficient documentation

## 2012-11-18 DIAGNOSIS — F3289 Other specified depressive episodes: Secondary | ICD-10-CM | POA: Insufficient documentation

## 2012-11-18 DIAGNOSIS — Z87891 Personal history of nicotine dependence: Secondary | ICD-10-CM | POA: Insufficient documentation

## 2012-11-18 DIAGNOSIS — G7249 Other inflammatory and immune myopathies, not elsewhere classified: Secondary | ICD-10-CM | POA: Insufficient documentation

## 2012-11-18 DIAGNOSIS — I739 Peripheral vascular disease, unspecified: Secondary | ICD-10-CM | POA: Insufficient documentation

## 2012-11-18 DIAGNOSIS — D759 Disease of blood and blood-forming organs, unspecified: Secondary | ICD-10-CM | POA: Insufficient documentation

## 2012-11-18 HISTORY — PX: MUSCLE BIOPSY: SHX716

## 2012-11-18 LAB — BASIC METABOLIC PANEL
BUN: 14 mg/dL (ref 6–23)
Chloride: 101 mEq/L (ref 96–112)
Creatinine, Ser: 0.53 mg/dL (ref 0.50–1.10)
GFR calc Af Amer: >90 mL/min (ref >90)
Glucose, Bld: 102 mg/dL — ABNORMAL HIGH (ref 70–99)
Potassium: 3.9 mEq/L (ref 3.5–5.1)

## 2012-11-18 SURGERY — MUSCLE BIOPSY
Anesthesia: Monitor Anesthesia Care | Site: Leg Upper | Laterality: Right | Wound class: Clean

## 2012-11-18 MED ORDER — PROMETHAZINE HCL 25 MG/ML IJ SOLN: 6.2500 mg | INTRAMUSCULAR | Status: DC | PRN

## 2012-11-18 MED ORDER — OXYCODONE HCL 5 MG PO TABS: 5.0000 mg | ORAL_TABLET | Freq: Once | ORAL | Status: DC | PRN

## 2012-11-18 MED ORDER — LIDOCAINE HCL (PF) 1 % IJ SOLN
INTRAMUSCULAR | Status: DC | PRN
  Administered 2012-11-18: 20 mL

## 2012-11-18 MED ORDER — OXYCODONE HCL 5 MG/5ML PO SOLN: 5.0000 mg | Freq: Once | ORAL | Status: DC | PRN

## 2012-11-18 MED ORDER — LACTATED RINGERS IV SOLN
INTRAVENOUS | Status: DC | PRN
  Administered 2012-11-18: 08:00:00 via INTRAVENOUS

## 2012-11-18 MED ORDER — LIDOCAINE HCL (CARDIAC) 20 MG/ML IV SOLN
INTRAVENOUS | Status: DC | PRN
  Administered 2012-11-18: 100 mg via INTRAVENOUS

## 2012-11-18 MED ORDER — LIDOCAINE HCL (PF) 1 % IJ SOLN
INTRAMUSCULAR | Status: AC
  Filled 2012-11-18: qty 30

## 2012-11-18 MED ORDER — PROPOFOL INFUSION 10 MG/ML OPTIME
INTRAVENOUS | Status: DC | PRN
  Administered 2012-11-18: 50 ug/kg/min via INTRAVENOUS

## 2012-11-18 MED ORDER — 0.9 % SODIUM CHLORIDE (POUR BTL) OPTIME
TOPICAL | Status: DC | PRN
  Administered 2012-11-18: 1000 mL

## 2012-11-18 MED ORDER — HYDROCODONE-ACETAMINOPHEN 5-325 MG PO TABS: 1.0000 | ORAL_TABLET | ORAL | Status: DC | PRN

## 2012-11-18 MED ORDER — HYDROMORPHONE HCL PF 1 MG/ML IJ SOLN: 0.2500 mg | INTRAMUSCULAR | Status: DC | PRN

## 2012-11-18 SURGICAL SUPPLY — 42 items
ADH SKN CLS APL DERMABOND .7 (GAUZE/BANDAGES/DRESSINGS)
BLADE SURG 15 STRL LF DISP TIS (BLADE) ×1 IMPLANT
BLADE SURG 15 STRL SS (BLADE) ×2
CANISTER SUCTION 2500CC (MISCELLANEOUS) IMPLANT
CHLORAPREP W/TINT 10.5 ML (MISCELLANEOUS) ×2 IMPLANT
CLOTH BEACON ORANGE TIMEOUT ST (SAFETY) ×2 IMPLANT
CONT SPEC STER OR (MISCELLANEOUS) ×2 IMPLANT
COVER SURGICAL LIGHT HANDLE (MISCELLANEOUS) ×2 IMPLANT
DERMABOND ADVANCED (GAUZE/BANDAGES/DRESSINGS)
DERMABOND ADVANCED .7 DNX12 (GAUZE/BANDAGES/DRESSINGS) ×1 IMPLANT
DRAPE PED LAPAROTOMY (DRAPES) ×2 IMPLANT
DRAPE UTILITY 15X26 W/TAPE STR (DRAPE) ×4 IMPLANT
DRESSING TELFA 8X3 (GAUZE/BANDAGES/DRESSINGS) ×2 IMPLANT
ELECT CAUTERY BLADE 6.4 (BLADE) ×2 IMPLANT
ELECT REM PT RETURN 9FT ADLT (ELECTROSURGICAL) ×2
ELECTRODE REM PT RTRN 9FT ADLT (ELECTROSURGICAL) ×1 IMPLANT
GAUZE SPONGE 4X4 16PLY XRAY LF (GAUZE/BANDAGES/DRESSINGS) ×2 IMPLANT
GLOVE BIOGEL PI IND STRL 7.0 (GLOVE) IMPLANT
GLOVE BIOGEL PI INDICATOR 7.0 (GLOVE) ×1
GLOVE EUDERMIC 7 POWDERFREE (GLOVE) ×1 IMPLANT
GLOVE SURG SS PI 7.0 STRL IVOR (GLOVE) ×1 IMPLANT
GOWN PREVENTION PLUS XLARGE (GOWN DISPOSABLE) ×2 IMPLANT
GOWN STRL NON-REIN LRG LVL3 (GOWN DISPOSABLE) ×2 IMPLANT
KIT BASIN OR (CUSTOM PROCEDURE TRAY) ×2 IMPLANT
KIT ROOM TURNOVER OR (KITS) ×2 IMPLANT
NDL HYPO 25X1 1.5 SAFETY (NEEDLE) ×1 IMPLANT
NEEDLE HYPO 25X1 1.5 SAFETY (NEEDLE) ×2 IMPLANT
NS IRRIG 1000ML POUR BTL (IV SOLUTION) ×2 IMPLANT
PACK SURGICAL SETUP 50X90 (CUSTOM PROCEDURE TRAY) ×2 IMPLANT
PAD ARMBOARD 7.5X6 YLW CONV (MISCELLANEOUS) ×4 IMPLANT
PENCIL BUTTON HOLSTER BLD 10FT (ELECTRODE) ×2 IMPLANT
SPONGE LAP 4X18 X RAY DECT (DISPOSABLE) ×1 IMPLANT
STAPLER VISISTAT 35W (STAPLE) ×1 IMPLANT
SUT MON AB 4-0 PC3 18 (SUTURE) ×2 IMPLANT
SUT VIC AB 3-0 SH 18 (SUTURE) ×2 IMPLANT
SYR BULB 3OZ (MISCELLANEOUS) ×2 IMPLANT
SYR CONTROL 10ML LL (SYRINGE) ×2 IMPLANT
TOWEL OR 17X24 6PK STRL BLUE (TOWEL DISPOSABLE) ×2 IMPLANT
TOWEL OR 17X26 10 PK STRL BLUE (TOWEL DISPOSABLE) ×2 IMPLANT
TUBE CONNECTING 12X1/4 (SUCTIONS) IMPLANT
WATER STERILE IRR 1000ML POUR (IV SOLUTION) IMPLANT
YANKAUER SUCT BULB TIP NO VENT (SUCTIONS) IMPLANT

## 2012-11-18 NOTE — Interval H&P Note (Signed)
History and Physical Interval Note:  11/18/2012 9:51 AM  Heather [name]  has presented today for surgery, with the diagnosis of progressive weakness  The various methods of treatment have been discussed with the patient and family. After consideration of risks, benefits and other options for treatment, the patient has consented to  Procedure(s): right vastus lateralis muscle biopsy  (Right) as a surgical intervention .  The patient's history has been reviewed, patient examined, no change in status, stable for surgery.  I have reviewed the patient's chart and labs.  Questions were answered to the patient's satisfaction.     Keene Gilkey J

## 2012-11-18 NOTE — H&P (View-Only) (Signed)
NAME: Heather [name]       DOB: 10/28/1940           DATE: 11/11/2012       MRN:[id number]  CC:  Chief Complaint  Patient presents with  . New Evaluation    eval for muscle Bx    HPI: all documented in the electronic medical record. She does have progressive weakness  limiting her activities. She is anxious to have a diagnosis established as possible.  Past medical history family history, review of systems etc. Are all in the electronic medical record and not redictated into this note but I have reviewed all of them.  EXAM: Vital signs: BP 130/72  Pulse 76  Temp(Src) 97.1 F (36.2 C) (Temporal)  Resp 16  Ht 5' 10" (1.778 m)  Wt 197 lb (89.359 kg)  BMI 28.27 kg/m2  LMP 11/08/1978  General: Patient alert, oriented, NAD Lungs: Clear to auscultation normal respirations Heart: Regular rhythm, no murmurs rubs or gallops Abdomen: Soft and benign. No masses or organomegaly Extremities: Does appear to have some weakness and walks with a cane. She is very minimal ankle edema bilaterally. Peripheral pulses are intact. IMP: muscle weakness of uncertain etiology  PLAN: I discussed the procedure of right vastus lateralis muscle biopsy with her. I explained to her this would be done as an outpatient but would require anesthesia. I told her that it would take several days to get a report back and it may not be definitive in terms of what her diagnosis is. I think she understands and wishes to go ahead and schedule surgery. We'll get this arranged for her at her convenience as soon as possible.  Yishai Rehfeld J 11/11/2012   

## 2012-11-18 NOTE — Anesthesia Preprocedure Evaluation (Addendum)
Anesthesia Evaluation  Patient identified by MRN, date of birth, ID band Patient awake    Reviewed: Allergy & Precautions, H&P , NPO status , Patient's Chart, lab work & pertinent test results  History of Anesthesia Complications Negative for: history of anesthetic complications  Airway Mallampati: II TM Distance: >3 FB Neck ROM: Full    Dental  (+) Teeth Intact and Dental Advisory Given   Pulmonary neg pulmonary ROS, former smoker,    Pulmonary exam normal       Cardiovascular hypertension, Pt. on medications and Pt. on home beta blockers + Peripheral Vascular Disease     Neuro/Psych  Headaches, PSYCHIATRIC DISORDERS Depression  Neuromuscular disease    GI/Hepatic GERD-  Medicated and Controlled,(+) Hepatitis -, Unspecified  Endo/Other  negative endocrine ROS  Renal/GU negative Renal ROS     Musculoskeletal   Abdominal   Peds  Hematology  (+) Blood dyscrasia, ,   Anesthesia Other Findings   Reproductive/Obstetrics                       Anesthesia Physical Anesthesia Plan  ASA: III  Anesthesia Plan: MAC   Post-op Pain Management:    Induction: Intravenous  Airway Management Planned: Natural Airway and Nasal Cannula  Additional Equipment:   Intra-op Plan:   Post-operative Plan:   Informed Consent: I have reviewed the patients History and Physical, chart, labs and discussed the procedure including the risks, benefits and alternatives for the proposed anesthesia with the patient or authorized representative who has indicated his/her understanding and acceptance.   Dental advisory given  Plan Discussed with: CRNA, Anesthesiologist and Surgeon  Anesthesia Plan Comments:      Anesthesia Quick Evaluation

## 2012-11-18 NOTE — Transfer of Care (Signed)
Immediate Anesthesia Transfer of Care Note  Patient: Heather [name]  Procedure(s) Performed: Procedure(s): right vastus lateralis muscle biopsy  (Right)  Patient Location: PACU  Anesthesia Type:MAC  Level of Consciousness: awake, alert  and oriented  Airway & Oxygen Therapy: Patient Spontanous Breathing and Patient connected to nasal cannula oxygen  Post-op Assessment: Report given to PACU RN  Post vital signs: Reviewed and stable  Complications: No apparent anesthesia complications

## 2012-11-18 NOTE — Anesthesia Postprocedure Evaluation (Signed)
  Anesthesia Post-op Note  Patient: Heather [name]  Procedure(s) Performed: Procedure(s): right vastus lateralis muscle biopsy  (Right)  Patient Location: PACU  Anesthesia Type:MAC  Level of Consciousness: alert   Airway and Oxygen Therapy: Patient Spontanous Breathing  Post-op Pain: mild  Post-op Assessment: Patient's Cardiovascular Status Stable  Post-op Vital Signs: stable  Complications: No apparent anesthesia complications

## 2012-11-18 NOTE — Preoperative (Signed)
Beta Blockers   Lopressor 100 mg PO on

## 2012-11-18 NOTE — Discharge Instructions (Addendum)
Rest today You may resume normal activities tomorrow You may shower tomorrow. The incision has all stitches under the skin so the area is OK to get wet in a shower  Instructions Following General Anesthetic, Adult A nurse specialized in giving anesthesia (anesthetist) or a doctor specialized in giving anesthesia (anesthesiologist) gave you a medicine that made you sleep while a procedure was performed. For as long as 24 hours following this procedure, you may feel:  Dizzy.  Weak.  Drowsy. AFTER THE PROCEDURE After surgery, you will be taken to the recovery area where a nurse will monitor your progress. You will be allowed to go home when you are awake, stable, taking fluids well, and without complications. For the first 24 hours following an anesthetic:  Have a responsible person with you.  Do not drive a car. If you are alone, do not take public transportation.  Do not drink alcohol.  Do not take medicine that has not been prescribed by your caregiver.  Do not sign important papers or make important decisions.  You may resume normal diet and activities as directed.  Change bandages (dressings) as directed.  Only take over-the-counter or prescription medicines for pain, discomfort, or fever as directed by your caregiver. If you have questions or problems that seem related to the anesthetic, call the hospital and ask for the anesthetist or anesthesiologist on call. SEEK IMMEDIATE MEDICAL CARE IF:   You develop a rash.  You have difficulty breathing.  You have chest pain.  You develop any allergic problems. Document Released: 10/30/2000 Document Revised: 10/16/2011 Document Reviewed: 06/10/2007 [facility] Patient Information 2013 [place], [place].

## 2012-11-18 NOTE — Op Note (Signed)
Heather [name] Jaegers Nov 22, 1940 [id number] 11/11/2012  Preoperative diagnosis: myopathy, uncertain etiology  Postoperative diagnosis: same  Procedure: muscle biopsy, right vastus lateralis  Surgeon: Currie Paris, MD, FACS    Anesthesia: MAC   Clinical History and Indications: status had progressive weakness and after workup her neurologist recommended a right vastus lateralis muscle biopsy to further elucidate her problem    Description of Procedure: the patient was seen in the preoperative area and the plan is confirmed. The right lateral thigh was marked as the operative site. The patient was taken to the operating room and given IV sedation. The right thigh was prepped and draped in a time out was done.  I used 1% Xylocaine plain for local. I infiltrated a longitudinal area of the lateral thigh. After excellent anesthesia, a longitudinal incision was made, subcutaneous tissues divided, and the fascia identified and opened. 2 segments of muscle were taken each about 1.5 cm. These were sent for a pathology in saline. Additional local was infiltrated as I got deeper.  The incision was closed with interrupted 3-0 Vicryl in layers, 4 month subcuticular plus Dermabond. Patient tolerated procedure well. There were no complications. Counts were correct.  Currie Paris, MD, FACS 11/18/2012 9:38 AM

## 2012-11-19 ENCOUNTER — Encounter (HOSPITAL_COMMUNITY): Payer: Self-pay | Admitting: Surgery

## 2012-12-02 ENCOUNTER — Ambulatory Visit: Payer: Medicare PPO | Admitting: Neurology

## 2012-12-03 ENCOUNTER — Encounter (INDEPENDENT_AMBULATORY_CARE_PROVIDER_SITE_OTHER): Payer: Self-pay | Admitting: Surgery

## 2012-12-03 ENCOUNTER — Ambulatory Visit (INDEPENDENT_AMBULATORY_CARE_PROVIDER_SITE_OTHER): Payer: Medicare PPO | Admitting: Surgery

## 2012-12-03 VITALS — BP 128/76 | HR 82 | Temp 98.8°F | Resp 18 | Ht 70.0 in | Wt 199.0 lb

## 2012-12-03 DIAGNOSIS — M6281 Muscle weakness (generalized): Secondary | ICD-10-CM

## 2012-12-03 DIAGNOSIS — Z09 Encounter for follow-up examination after completed treatment for conditions other than malignant neoplasm: Secondary | ICD-10-CM

## 2012-12-03 NOTE — Patient Instructions (Signed)
We will see you again on an as needed basis. Please call the office at [phone] if you have any questions or concerns. Thank you for allowing us to take care of you.  

## 2012-12-03 NOTE — Progress Notes (Signed)
Chief complaint: Postop right vastus lateralis muscle biopsy  History of present illness this patient went also biopsy on April 14. She is doing well from surgery with no particular problems and no pain.  Exam: Incision is healing nicely  Data reviewed: Pathology report is still pending  Impression: Doing well  Plan: RTC when necessary

## 2012-12-04 ENCOUNTER — Telehealth (INDEPENDENT_AMBULATORY_CARE_PROVIDER_SITE_OTHER): Payer: Self-pay | Admitting: General Surgery

## 2012-12-04 NOTE — Telephone Encounter (Signed)
Message copied by Liliana Cline on Wed Dec 04, 2012 11:47 AM ------      Message from: Currie Paris      Created: Wed Dec 04, 2012 11:41 AM       Her nose at the pathology report she showed inflammation of the muscle. I have forwarded that report to her primary care physician ------

## 2012-12-04 NOTE — Telephone Encounter (Signed)
Left message on machine for patient to call back and ask for me. To make her aware pathology report showed inflammation of the muscle. This report has been forwarded to her PCP and what to do re: results will be a conversation for her to have with her PCP. Awaiting call back.

## 2013-01-06 ENCOUNTER — Ambulatory Visit: Payer: Medicare PPO | Admitting: Neurology

## 2013-01-15 ENCOUNTER — Other Ambulatory Visit: Payer: Self-pay | Admitting: *Deleted

## 2013-01-20 MED ORDER — METOPROLOL TARTRATE 100 MG PO TABS: 200.0000 mg | ORAL_TABLET | Freq: Two times a day (BID) | ORAL | Status: DC

## 2013-01-20 MED ORDER — SPIRONOLACTONE 50 MG PO TABS: 25.0000 mg | ORAL_TABLET | Freq: Every day | ORAL | Status: DC

## 2013-01-20 MED ORDER — AMLODIPINE BESYLATE 5 MG PO TABS: 5.0000 mg | ORAL_TABLET | Freq: Every day | ORAL | Status: DC

## 2013-01-20 MED ORDER — FLUOXETINE HCL 40 MG PO CAPS: 40.0000 mg | ORAL_CAPSULE | Freq: Every day | ORAL | Status: DC

## 2013-01-20 MED ORDER — CLONIDINE HCL 0.1 MG PO TABS: 0.1000 mg | ORAL_TABLET | Freq: Two times a day (BID) | ORAL | Status: DC

## 2013-01-22 ENCOUNTER — Other Ambulatory Visit: Payer: Self-pay | Admitting: Internal Medicine

## 2013-01-22 DIAGNOSIS — I10 Essential (primary) hypertension: Secondary | ICD-10-CM

## 2013-01-22 MED ORDER — METOPROLOL TARTRATE 100 MG PO TABS: 100.0000 mg | ORAL_TABLET | Freq: Two times a day (BID) | ORAL | Status: DC

## 2013-01-24 ENCOUNTER — Encounter (HOSPITAL_COMMUNITY): Payer: Self-pay | Admitting: Emergency Medicine

## 2013-01-24 ENCOUNTER — Emergency Department (INDEPENDENT_AMBULATORY_CARE_PROVIDER_SITE_OTHER): Payer: Medicare PPO

## 2013-01-24 ENCOUNTER — Emergency Department (HOSPITAL_COMMUNITY)
Admission: EM | Admit: 2013-01-24 | Discharge: 2013-01-24 | Disposition: A | Discharge status: Home / Self Care | Payer: Medicare PPO | Source: Home / Self Care | Attending: Family Medicine | Admitting: Family Medicine

## 2013-01-24 DIAGNOSIS — S93409A Sprain of unspecified ligament of unspecified ankle, initial encounter: Secondary | ICD-10-CM

## 2013-01-24 DIAGNOSIS — S92253A Displaced fracture of navicular [scaphoid] of unspecified foot, initial encounter for closed fracture: Secondary | ICD-10-CM

## 2013-01-24 DIAGNOSIS — S92251A Displaced fracture of navicular [scaphoid] of right foot, initial encounter for closed fracture: Secondary | ICD-10-CM

## 2013-01-24 DIAGNOSIS — S93401A Sprain of unspecified ligament of right ankle, initial encounter: Secondary | ICD-10-CM

## 2013-01-24 MED ORDER — HYDROCODONE-ACETAMINOPHEN 5-325 MG PO TABS: 1.0000 | ORAL_TABLET | Freq: Four times a day (QID) | ORAL | Status: DC | PRN

## 2013-01-24 NOTE — ED Notes (Signed)
Pt c/o right foot inj onset this am... Reports her leg "gave out on her" and she twisted her right ankle... sxs today include: swelling, pain and unable to bear weight... Denies: head inj/LOC... She is alert and oriented w/no signs of acute distress.

## 2013-01-24 NOTE — ED Provider Notes (Signed)
History     CSN: [id number]  Arrival date & time 01/24/13  1139   First MD Initiated Contact with Patient 01/24/13 1144      Chief Complaint  Patient presents with  . Foot Injury    (Consider location/radiation/quality/duration/timing/severity/associated sxs/prior treatment) Patient is a 72 y.o. female presenting with ankle pain. The history is provided by the patient and a relative.  Ankle Pain Location:  Ankle and foot Time since incident:  3 hours Injury: yes   Mechanism of injury: fall   Fall:    Fall occurred:  In the bathroom   Impact surface:  Hard floor Ankle location:  R ankle Foot location:  Dorsum of R foot Pain details:    Quality:  Aching   Radiates to:  Does not radiate   Severity:  Moderate (unable to ambulate)   Onset quality:  Sudden Chronicity:  New Dislocation: no   Foreign body present:  No foreign bodies Relieved by:  Elevation Worsened by:  Bearing weight Associated symptoms: no back pain     Past Medical History  Diagnosis Date  . Depression     Controlled with SSRI  . HTN (hypertension)   . GERD (gastroesophageal reflux disease)     Chronic PPI's  . S/P dilatation of esophageal stricture     7/12 Dr Leone Payor. Dilation of tight stricture proximal eso.  Prior dilation 09 and 08.  . Seasonal allergies     Zyrtec D PRN  . NASH (nonalcoholic steatohepatitis)     Per [place] 10/10  . History of PSVT (paroxysmal supraventricular tachycardia)     Remote and self limited.  . Gout     No crystal dx. R podegra x 3 episodes around 2010. Did not take the allopurinol that was rec 2011.  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 Chronic venous insufficiency     Present for years. Norvasc D/C'd 2012 thinking it might be contributing.  . Migraines   . Glaucoma   . Neuropathy   . Back pain   . Muscle weakness     Past Surgical History  Procedure Laterality Date  . Cholecystectomy    . Tubal  ligation    . Laparoscopic burch procedure  04/02/07    Burch colposuspension. Inadvertant bladder damage - bladder repair, left ureteral stent placement, .  . Knee arthroscopy    . Abdominal hysterectomy  1980    2/2 menorrhagia  . Colonoscopy  09/28/2006    diverticulosis  . 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 dyspmotility of esophagus  . Bladder repair    . Eye surgery      cataracts, glaucoma  . Muscle biopsy Right 11/18/2012    Procedure: right vastus lateralis muscle biopsy ;  Surgeon: Currie Paris, MD;  Location: [facility] OR;  Service: General;  Laterality: Right;    Family History  Problem Relation Age of Onset  . Heart disease Mother   . Hypertension Mother   . Heart attack Mother   . Heart disease Father   . Hypertension Father   . Stroke Father   . Heart disease Sister   . Hypertension Sister   . Throat cancer Brother   . Hypertension Brother   . Heart disease Brother   . Colon cancer Neg Hx     History  Substance Use Topics  . Smoking status:  Former Smoker    Types: Cigarettes    Quit date: 08/07/1968  . Smokeless tobacco: Never Used  . Alcohol Use: No    OB History   Grav Para Term Preterm Abortions TAB SAB Ect Mult Living                  Review of Systems  Constitutional: Negative.   Musculoskeletal: Positive for joint swelling and gait problem. Negative for back pain.  Skin: Negative.     Allergies  Propoxyphene napsylate; Darvon; and Latex  Home Medications   Current Outpatient Rx  Name  Route  Sig  Dispense  Refill  . albuterol (PROAIR HFA) 108 (90 BASE) MCG/ACT inhaler   Inhalation   Inhale 2 puffs into the lungs every 6 (six) hours as needed for shortness of breath.          Marland Kitchen amitriptyline (ELAVIL) 50 MG tablet   Oral   Take 1 tablet (50 mg total) by mouth at bedtime.   90 tablet   3   . amLODipine (NORVASC) 5 MG tablet    Oral   Take 1 tablet (5 mg total) by mouth daily.   90 tablet   1   . Cetirizine HCl 10 MG CAPS   Oral   Take 1 capsule (10 mg total) by mouth daily.   90 capsule   0   . cloNIDine (CATAPRES) 0.1 MG tablet   Oral   Take 1 tablet (0.1 mg total) by mouth 2 (two) times daily.   180 tablet   1   . esomeprazole (NEXIUM) 40 MG capsule   Oral   Take 1 capsule (40 mg total) by mouth daily before breakfast.   10 capsule   0     Samples given to patient    Lot# [id number]           ...   . FLUoxetine (PROZAC) 40 MG capsule   Oral   Take 1 capsule (40 mg total) by mouth daily.   90 capsule   1   . hydrochlorothiazide (HYDRODIURIL) 25 MG tablet   Oral   Take 1 tablet (25 mg total) by mouth daily.   90 tablet   3   . HYDROcodone-acetaminophen (NORCO) 5-325 MG per tablet   Oral   Take 1 tablet by mouth every 4 (four) hours as needed for pain.   30 tablet   0   . HYDROcodone-acetaminophen (NORCO/VICODIN) 5-325 MG per tablet   Oral   Take 1 tablet by mouth every 6 (six) hours as needed for pain.   20 tablet   0   . metoprolol (LOPRESSOR) 100 MG tablet   Oral   Take 1 tablet (100 mg total) by mouth 2 (two) times daily.   60 tablet   3   . Multiple Vitamin (MULTIVITAMIN) capsule   Oral   Take 1 capsule by mouth daily.          Marland Kitchen spironolactone (ALDACTONE) 50 MG tablet   Oral   Take 0.5 tablets (25 mg total) by mouth daily.   90 tablet   1   . timolol (TIMOPTIC) 0.25 % ophthalmic solution   Left Eye   Place 1 drop into the left eye 2 (two) times daily.            BP 132/65  Pulse 80  Temp(Src) 98.1 F (36.7 C) (Oral)  Resp 18  SpO2 99%  LMP 11/08/1978  Physical Exam  Nursing note  and vitals reviewed. Constitutional: She is oriented to person, place, and time. She appears well-developed and well-nourished.  Musculoskeletal: She exhibits tenderness.       Right ankle: She exhibits decreased range of motion and swelling. She exhibits no ecchymosis and no  deformity. Tenderness. Lateral malleolus and AITFL tenderness found. No head of 5th metatarsal and no proximal fibula tenderness found.       Right foot: She exhibits decreased range of motion, tenderness and swelling.       Feet:  Neurological: She is alert and oriented to person, place, and time.  Skin: Skin is warm and dry.    ED Course  Procedures (including critical care time)  Labs Reviewed - No data to display Dg Ankle Complete Right  01/24/2013   *RADIOLOGY REPORT*  Clinical Data: Pain post fall  RIGHT ANKLE - COMPLETE 3+ VIEW  Comparison: None.  Findings: Three views of the right ankle submitted.  No acute fracture or subluxation. Ankle mortise is preserved.  Posterior spurring of the calcaneus.  IMPRESSION: No acute fracture or subluxation.  Posterior spurring of the calcaneus.   Original Report Authenticated By: Natasha Mead, M.D.   Dg Foot Complete Right  01/24/2013   *RADIOLOGY REPORT*  Clinical Data: Pain post injury, fall  RIGHT FOOT COMPLETE - 3+ VIEW  Comparison: None.  Findings: Four views of the right foot submitted.  No acute fracture or subluxation.  Posterior spur of the calcaneus at the Achilles tendon insertion.  Mild dorsal spurring of the navicular. Mild erosive degenerative changes distal aspect proximal phalanx great toe.  IMPRESSION: No acute fracture or subluxation.  Mild degenerative changes as described above.  Posterior spurring of the calcaneus.   Original Report Authenticated By: Natasha Mead, M.D.     1. Avulsion fracture of navicular bone of foot, right, closed, initial encounter   2. Ankle sprain, right, initial encounter       MDM  X-rays reviewed and report per radiologist.    Barkley Bruns MD.          Linna Hoff, MD 01/24/13 1224

## 2013-01-28 ENCOUNTER — Ambulatory Visit: Payer: Medicare PPO | Admitting: Neurology

## 2013-01-29 ENCOUNTER — Ambulatory Visit (INDEPENDENT_AMBULATORY_CARE_PROVIDER_SITE_OTHER): Payer: Medicare PPO | Admitting: Neurology

## 2013-01-29 ENCOUNTER — Encounter: Payer: Self-pay | Admitting: Neurology

## 2013-01-29 VITALS — BP 125/71 | HR 73 | Temp 97.3°F | Ht 70.75 in | Wt 200.0 lb

## 2013-01-29 DIAGNOSIS — K219 Gastro-esophageal reflux disease without esophagitis: Secondary | ICD-10-CM

## 2013-01-29 DIAGNOSIS — IMO0001 Reserved for inherently not codable concepts without codable children: Secondary | ICD-10-CM

## 2013-01-29 MED ORDER — ESOMEPRAZOLE MAGNESIUM 40 MG PO CPDR: 40.0000 mg | DELAYED_RELEASE_CAPSULE | Freq: Every day | ORAL | Status: DC

## 2013-01-29 NOTE — Progress Notes (Signed)
Subjective:    Patient ID: Heather [name] is a 72 y.o. female.  HPI  Interim history:   Heather [name] is a very pleasant 72 year old right-handed lady who presents for followup consultation of her weakness and recurrent falls. She is accompanied by her daughter today. I last saw her on 10/03/2012 and first met her on 08/23/2012, at which time she presented with a gradual onset of lower more than upper extremity weakness. This has been going on for about a year now. She has had recurrent falls. She had abnormal EMG and nerve conduction findings and her orthopedic doctor's office. I had requested inflammatory markers, neuropathy laboratory testing and MRI brain and C-spine. She had an elevated AST at 46 and ALT at 63. SPEP was unremarkable, A1c was 5.7, TSH normal, ANA, RPR, CRP were normal, her CK was elevated at 1134 and her vitamin B12 level was high at 1511. She came back for repeat CK and CK-MB as well as aldolase and CK was still high in February at 947 and CK-MB was high at 37.7. Aldolase was elevated at 15.2. She saw her primary care physician because CK-MB elevation. MRI brain from January 2014 showed mild changes of age-appropriate chronic microvascular ischemia, and MRI C-spine from January 2014 showed mild degenerative disc disease without frank cord compression. There is family history of weakness. At the time of her last visit in February I suggested further workup for myopathy and requested help from one of my colleagues who specializes in neuromuscular diseases and suggested another EMG, nerve conduction test which she had on 10/21/2012: Nerve conduction studies on the left upper extremity showed mild neuropathic changes, EMG evaluation of the left upper extremity shows active denervation mainly in the C6-C7 muscles but there was not much evidence of cervical radiculopathy. There was no clear chronic myopathic or neuropathic change, but Dr. Anne Hahn recommended given the CK elevation some  proximal weakness in the legs repeat EMG evaluation of at least one lower extremity and a low grade inflammatory myopathic disorder should be considered. She saw Dr. 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 She reports no new symptoms, but she did fall a few days ago and injured her right foot. She is wearing a splint on it she is walking with a rolling walker.  Her Past Medical History Is Significant For: Past Medical History  Diagnosis Date  . Depression     Controlled with SSRI  . HTN (hypertension)   . GERD (gastroesophageal reflux disease)     Chronic PPI's  . S/P dilatation of esophageal stricture     7/12 Dr Leone Payor. Dilation of tight stricture proximal eso.  Prior dilation 09 and 08.  . Seasonal allergies     Zyrtec D PRN  . NASH (nonalcoholic steatohepatitis)     Per [place] 10/10  . History of PSVT (paroxysmal supraventricular tachycardia)     Remote and self limited.  . Gout     No crystal dx. R podegra x 3 episodes around 2010. Did not take the allopurinol that was rec 2011.  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 Chronic venous insufficiency     Present for years. Norvasc D/C'd 2012 thinking it might be contributing.  . Migraines   . Glaucoma   . Neuropathy   .  Back pain   . Muscle weakness     Her Past Surgical History Is Significant For: Past Surgical History  Procedure Laterality Date  . Cholecystectomy    . Tubal ligation    . Laparoscopic burch procedure  04/02/07    Burch colposuspension. Inadvertant bladder damage - bladder repair, left ureteral stent placement, .  . Knee arthroscopy    . Abdominal hysterectomy  1980    2/2 menorrhagia  . Colonoscopy  09/28/2006    diverticulosis  . Esophagogastroduodenoscopy  09/28/2006; 07/17/2008    dilation of  proximal stenosis/cricopharyngeal achalasais both times, occult ring, ? dyspmotility of esophagus  . Bladder repair    . Eye surgery      cataracts, glaucoma  . Muscle biopsy Right 11/18/2012    Procedure: right vastus lateralis muscle biopsy ;  Surgeon: Currie Paris, MD;  Location: [facility] OR;  Service: General;  Laterality: Right;    Her Family History Is Significant For: Family History  Problem Relation Age of Onset  . Heart disease Mother   . Hypertension Mother   . Heart attack Mother   . Heart disease Father   . Hypertension Father   . Stroke Father   . Heart disease Sister   . Hypertension Sister   . Throat cancer Brother   . Hypertension Brother   . Heart disease Brother   . Colon cancer Neg Hx     Her Social History Is Significant For: History   Social History  . Marital Status: Widowed    Spouse Name: N/A    Number of Children: N/A  . Years of Education: N/A   Social History Main Topics  . Smoking status: Former Smoker    Types: Cigarettes    Quit date: 08/07/1968  . Smokeless tobacco: Never Used  . Alcohol Use: No  . Drug Use: No  . Sexually Active: No   Other Topics Concern  . None   Social History Narrative  . None    Her Allergies Are:  Allergies  Allergen Reactions  . Propoxyphene Napsylate Nausea Only  . Darvon   . Latex     Per Patient latex=rash "hands look burned"  :   Her Current Medications Are:  Outpatient Encounter Prescriptions as of 01/29/2013  Medication Sig Dispense Refill  . albuterol (PROAIR HFA) 108 (90 BASE) MCG/ACT inhaler Inhale 2 puffs into the lungs every 6 (six) hours as needed for shortness of breath.       Marland Kitchen amitriptyline (ELAVIL) 50 MG tablet Take 1 tablet (50 mg total) by mouth at bedtime.  90 tablet  3  . amLODipine (NORVASC) 5 MG tablet Take 1 tablet (5 mg total) by mouth daily.  90 tablet  1  . Cetirizine HCl 10 MG CAPS Take 1 capsule (10 mg total) by mouth daily.  90 capsule  0  . cloNIDine (CATAPRES) 0.1 MG  tablet Take 1 tablet (0.1 mg total) by mouth 2 (two) times daily.  180 tablet  1  . esomeprazole (NEXIUM) 40 MG capsule Take 1 capsule (40 mg total) by mouth daily before breakfast.  10 capsule  0  . FLUoxetine (PROZAC) 40 MG capsule Take 1 capsule (40 mg total) by mouth daily.  90 capsule  1  . hydrochlorothiazide (HYDRODIURIL) 25 MG tablet Take 1 tablet (25 mg total) by mouth daily.  90 tablet  3  . HYDROcodone-acetaminophen (NORCO/VICODIN) 5-325 MG per tablet Take 1 tablet by mouth every 6 (six) hours as needed for pain.  20 tablet  0  . metoprolol (LOPRESSOR) 100 MG tablet Take 1 tablet (100 mg total) by mouth 2 (two) times daily.  60 tablet  3  . Multiple Vitamin (MULTIVITAMIN) capsule Take 1 capsule by mouth daily.       Marland Kitchen omeprazole (PRILOSEC) 40 MG capsule Take 40 mg by mouth daily.      Marland Kitchen spironolactone (ALDACTONE) 50 MG tablet Take 0.5 tablets (25 mg total) by mouth daily.  90 tablet  1  . timolol (TIMOPTIC) 0.25 % ophthalmic solution Place 1 drop into the left eye 2 (two) times daily.       . [DISCONTINUED] HYDROcodone-acetaminophen (NORCO) 5-325 MG per tablet Take 1 tablet by mouth every 4 (four) hours as needed for pain.  30 tablet  0   No facility-administered encounter medications on file as of 01/29/2013.   Review of Systems  Constitutional: Positive for fatigue.  HENT: Positive for trouble swallowing and tinnitus.   Cardiovascular: Positive for leg swelling.  Musculoskeletal: Positive for joint swelling and arthralgias.  Neurological: Positive for dizziness and weakness.  Hematological:       Anemia  Psychiatric/Behavioral: The patient is nervous/anxious.        Racing thoughts    Objective:  Neurologic Exam  Physical Exam Physical Examination:   Filed Vitals:   01/29/13 1559  BP: 125/71  Pulse: 73  Temp: 97.3 F (36.3 C)   On general exam she is a very pleasant elderly lady in no apparent distress. She is providing a fairly good history. HEENT exam reveals  normocephalic, atraumatic, face is symmetric with normal facial animation. Speech is clear. Extraocular tracking is good without nystagmus. Hearing is intact with normal tympanic membranes noted. Pupils are equal, round and reactive to light. Oropharynx is clear with tongue is central and no fasciculations or fibrillations are seen on the tongue. Palate elevates symmetrically. There is no atrophy of the tongue muscle. Neck is supple with full range of motion. Chest is clear to auscultation bilaterally with no wheezing noted. She has an intermittent cough.  Heart sounds are normal with no murmurs, rubs or gallops noted. Abdomen soft, nontender with normal bowel sounds appreciated. She has 1+ pitting edema in the distal lower extremities, primarily around her ankles bilaterally. Her right foot is in a brace. No skin changes are appreciated, in particular no skin depigmentation or hyperpigmentation. Skin is warm and dry. Neurologically: Mental status: The patient is awake, alert and oriented in all 3 spheres. Her memory, attention, language and knowledge are appropriate. Cranial nerves II through XII are as described under HEENT exam. In addition, her shoulder strength is good bilaterally. On motor exam, she has normal bulk throughout. I do not appreciate any focal or globalized atrophy. I do not see any fasciculations or myocloni. Tone is normal. There is no drift or tremor. On strength testing in the upper extremities she has very mild weakness in both biceps. Her intrinsic hand muscles are slightly weak on the left only. She has grip strength weakness on the left 4-/5 and slight grip strength weakness on the right 4+/5. Otherwise strength is fine in the upper extremities. In the lower extremities, she has hip flexion weakness 4/5 bilaterally. Her right knee extension is slightly weak and her left knee extension is 4/5. She does seem to have more hip flexor weakness on today's exam Reflexes are diminished in  the upper extremities and absent in both lower extremities, toes are downgoing. She has no clonus. On sensory testing  she has intact sensation to light touch, pinprick, vibration and temperature throughout. Cerebellar exam showed no dysmetria or intention tremor. She has no truncal or gait ataxia. Her her stance is slightly wide-based and she walks slowly with caution and somewhat insecurely. She turns fairly well. She walks with her walker   Assessment and Plan:   In summary, Heather [name] is a very pleasant 72 year old right-handed woman with a history of progressive weakness affecting primarily both lower extremities L > R. This started gradually in 8/13 or even before that. She also has milder weakness in both UEs, L > R. This seems to be more proximal at this time. She does not have any fasciculations and no signs of atrophy are seen. No trophic changes are seen. She has had non-revealing MRI brain and C spine and had abnormalities in CK level, aldolase level, and EMG and nerve conduction findings concerning for myopathy. 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 I was able to discuss these findings with Dr. Terrace Arabia today and she suggested repeat EMG to specifically look for evidence of inclusion body myositis. After that she will see the patient back in followup and then discuss specific treatment options with her. I relayed these findings to the patient and she would be agreeable to coming back for EMG testing and then subsequent followup with Dr. Terrace Arabia. I do believe that she will be and that her hands with someone who has more expertise with neuromuscular diseases. The patient and her daughter were in agreement.

## 2013-01-29 NOTE — Patient Instructions (Signed)
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 To be able to diagnose your condition more specifically we will have to do another EMG test and I will request this with Dr. Terrace Arabia. We will then have you followup with Dr. Terrace Arabia. I have already discussed it with her and for now we will hold off on any new medication. Your muscle biopsy did confirm a form of muscle cell inflammation and inclusion body myositis cannot be ruled out at this time.

## 2013-02-04 ENCOUNTER — Other Ambulatory Visit: Payer: Self-pay | Admitting: *Deleted

## 2013-02-05 MED ORDER — AMLODIPINE BESYLATE 5 MG PO TABS: 5.0000 mg | ORAL_TABLET | Freq: Every day | ORAL | Status: DC

## 2013-02-05 MED ORDER — SPIRONOLACTONE 50 MG PO TABS: 25.0000 mg | ORAL_TABLET | Freq: Every day | ORAL | Status: DC

## 2013-02-05 MED ORDER — HYDROCHLOROTHIAZIDE 25 MG PO TABS: 25.0000 mg | ORAL_TABLET | Freq: Every day | ORAL | Status: DC

## 2013-02-05 MED ORDER — FLUOXETINE HCL 40 MG PO CAPS: 40.0000 mg | ORAL_CAPSULE | Freq: Every day | ORAL | Status: DC

## 2013-02-05 MED ORDER — CLONIDINE HCL 0.1 MG PO TABS: 0.1000 mg | ORAL_TABLET | Freq: Two times a day (BID) | ORAL | Status: DC

## 2013-02-14 ENCOUNTER — Encounter: Payer: Medicare PPO | Admitting: Neurology

## 2013-02-14 ENCOUNTER — Telehealth: Payer: Self-pay | Admitting: Neurology

## 2013-03-03 ENCOUNTER — Encounter (INDEPENDENT_AMBULATORY_CARE_PROVIDER_SITE_OTHER): Payer: Medicare PPO

## 2013-03-03 ENCOUNTER — Ambulatory Visit (INDEPENDENT_AMBULATORY_CARE_PROVIDER_SITE_OTHER): Payer: Medicare PPO | Admitting: Neurology

## 2013-03-03 DIAGNOSIS — G7241 Inclusion body myositis [IBM]: Secondary | ICD-10-CM

## 2013-03-03 DIAGNOSIS — IMO0001 Reserved for inherently not codable concepts without codable children: Secondary | ICD-10-CM

## 2013-03-03 DIAGNOSIS — Z0289 Encounter for other administrative examinations: Secondary | ICD-10-CM

## 2013-03-03 MED ORDER — FLUOXETINE HCL 40 MG PO CAPS: 40.0000 mg | ORAL_CAPSULE | Freq: Every day | ORAL | Status: DC

## 2013-03-03 NOTE — Progress Notes (Signed)
Heather [name] is a very pleasant 72 -year-old right-handed woman, with her friend, referred by my colleague Dr. Frances Furbish for evaluation of muscle weakness, gait difficulty.  She was first seen by Dr. Frances Furbish in 08/23/2012 from refer physcian Dr. Regino Schultze.   She began to notice gradual onset gait difficulty for 3 years, initial symptoms was difficulty to clear her left foot from the floor, she tends to trip with her left foot, later she noticed difficulty with climbing up and getting down stairs, involving both leg, but left leg was constant weaker than the right side, she denies sensory changes, no bowel or bladder incontinence, she had a fall in August 2012, developed low back pain, had a rapid decline of gait difficulty since then, prior to her accident, she was able to works as a Nurse, mental health, lifting patients, since 2011, she has worsening gait difficulty, difficulty to extend her neck, also bilateral hands muscle weakness, has been using a cane since 2012,  she had an EMG and nerve conduction study in Dr. Juanda Chance office, which showied abnormalities especially on needle EMG findings, denervation with fasciculations in multiple muscles.   Laboratory evaluation showed  CMP showed mild increase in AST at 46 and ALT at 63. SPEP was unremarkable, hemoglobin A1c was 5.7, TSH normal. ANA, RPR, CRP normal. [id number] and her vitamin B12 level was high at 1511.  Repeat laboratory evaluation in 09/16/2012. The CK level was still high at 947 and the CK-MB was high at 37.7. The aldolase was high at 15.2.  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 MRI C-spine 09/06/12 showed mild degenerative disc disease without frank compression. She denies any twitching or involuntary movements. She denies skin rash, no sensory changes, she also reported a 3 years history of swallowing difficulty, was contributed to the esophageal constriction, has esophageal stretching in 2011  2012, most recent was in summer of 2013, but she continued to have mild swallowing difficulty. She denies significant muscle aching pain, there was no family history of vascular disease,  She had a repeat EMG nerve conduction study by Dr. Anne Hahn in October 21 2012, left median, ulnar nerve was normal. EMG was performed at left upper extremity muscles, there was evidence of spontaneous activity at left extensor index proppers, pronator teres, biceps, triceps, deltoid, was described to recruitment pattern, there was no spontaneous activity at left cervical paraspinal muscles  UPDATE March 03, 2013: She had right vastus lateralis muscle biopsy, which showed evidence of inflammatory myopathy, the pattern of inflammation does not suggest dermatomyositis, polymyositis, or other immune mediated myopathy, such as inclusion body myositis was suggested, inclusion body myositis can not be ruled out even in the absence of rimmed vacuoles.  Review of Systems  Out of a complete 14 system review, the patient complains of only the following symptoms, and all other reviewed systems are negative.   Constitutional:   Fatigue Cardiovascular:  Bilateral lower extremity swelling Ear/Nose/Throat:  Ringing in the ears, difficulty swallowing Skin: Itching Eyes: Blurry vision Respiratory: Cough Gastroitestinal: Constipation   Hematology/Lymphatic:  N/A Musculoskeletal:N/A Endocrine:  N/A Neurological: Memory loss difficulty swallowing, Psychiatric:    Depression , decreased energy     PHYSICAL EXAMINATOINS:  Generalized: In no acute distress  Neck: Supple, no carotid bruits   Cardiac: Regular rate rhythm  Pulmonary: Clear to auscultation bilaterally  Musculoskeletal: No deformity  Neurological examination  Mentation: Alert oriented to time, place, history taking, and causual conversation  Cranial nerve II-XII: Pupils  were equal round reactive to light extraocular movements were full, visual field were  full on confrontational test. facial sensation were normal. She has moderate bilateral eye-closure, cheek puff weakness. hearing was intact to finger rubbing bilaterally. Uvula tongue midline.  head turning and shoulder shrug and were normal and symmetric she has mild weakness in tongue protrusion into cheek strength there was no, atrophy or fasciculation .  Motor examination: she has mild to modreate neck flexion,mild to moderate bilateral shoulder abduction elbow flexion , mild elbow extension weakness, she also has moderate bilateral finger flexion weakness especially distal finger flexion, moderate hip flexion weakness, moderate knee extension weakness, 3+/5, knee flexion 4/4-, ankle dorsiflexon 1/3.   Sensory: Intact to fine touch, pinprick, preserved vibratory sensation, and proprioception at toes.  Coordination: Normal finger to nose, heel-to-shin bilaterally there was no truncal ataxia  Gait: slightly wide-based, very cautious, difficulty lifting her legs up   Romberg signs: Negative  Deep tendon reflexes: Brachioradialis 2/2, biceps 2/2, triceps 2/2, patellar   absent Achilles absent, plantar responses were flexor bilaterally.  I performed EMG today, right deltoid increased insertion activity, 2 plus spontaneous activity , thin polyspike motor unit potential with early recruitment patterns, right vastus lateralis increased insertion activity,  2 plus spontaneous activity , thin polyspike motor unit potential with early recruitment patterns. Right tibialis anterior: Increased insertion activity, 2+ spontaneous activity, thin polyspike motor unit potential with decreased recruitment patterns.   Assessment And plan: In summary, Heather [name] is a very pleasant 72 year old right-handed woman with a history of more than 70 years progressive weakness affecting both upper and lower extremity, she also has moderate facial diplegia, swallowing difficulties, bilateral quadriceps and finger flexion  weakness,  with elevated mild to modreate CPK, inflammatory myopathic changes on EMGs, muscle biopsy also consistent with inflammatory changes, inclusion body myositis cannot be ruled out.  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 2. Home physical therapy. 3. RTC in 6 months

## 2013-03-03 NOTE — Procedures (Signed)
    [facility]  NCS (NERVE CONDUCTION STUDY) WITH EMG (ELECTROMYOGRAPHY) REPORT   STUDY DATE:  03/03/2013 PATIENT NAME: Heather [name] DOB: 04/29/1941 MRN: [id number]    TECHNOLOGIST: Judithann Sheen ELECTROMYOGRAPHER: Levert Feinstein M.D.  CLINICAL INFORMATION:  72 yo with gradual onset facial diplegia, swallowing difficulty, bilateral arm and leg weakness. No sensory loss.   On examination: She has moderate eye-closure, cheek puff weakness. She has moderate neck flexion weakness. Bilateral shoulder abduction 4+, elbow flexion 4+, elbow extension 4+, finger flexion 3, hip flexion 3/3, knee extension 4/4, knee flexion 4+/4, ankle dorsiflexion 1/3. DRT: Hypoactive and symmetric   FINDINGS: NERVE CONDUCTION STUDY: Bilateral peroneal sensory response showed moderately decreased snap amplitude. Bilateral tibial H. reflexes were normal bilateral tibial motor responses were normal. Right peroneal to EDB motor response showed severely decreased CMAP amplitude, with normal conduction velocities, distal latency, left peroneal to EDB motor response showed a mildly decreased CMAP amplitude, with normal distal latency, conduction velocity.   NEEDLE ELECTROMYOGRAPHY:  Needle examination was performed at selected bilateral lower extremity muscles, right deltoid   Bilateral tibialis anterior: Increased insertion activity, 2 plus spontaneous activity,  thin motor unit potentials with decreased recruitment pattern. Right vastus lateralis: Increased insertion activity, 2 plus spontaneous activity, thin motor unit potentials with decreased recruitment pattern. Right deltoid: Increased insertion activity, fibrous muscle tissue, 2 plus spontaneous activity, thin complex motor unit potential with decreased motor unit potentials   IMPRESSION:  This is an abnormal study. 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 Ph.D. [facility] [address] [place], [place] [address] [phone]

## 2013-03-10 ENCOUNTER — Encounter: Payer: Self-pay | Admitting: Internal Medicine

## 2013-03-10 ENCOUNTER — Ambulatory Visit (INDEPENDENT_AMBULATORY_CARE_PROVIDER_SITE_OTHER): Payer: Medicare PPO | Admitting: Internal Medicine

## 2013-03-10 VITALS — BP 133/75 | HR 80 | Temp 97.2°F | Ht 70.0 in | Wt 201.0 lb

## 2013-03-10 DIAGNOSIS — L309 Dermatitis, unspecified: Secondary | ICD-10-CM

## 2013-03-10 DIAGNOSIS — K219 Gastro-esophageal reflux disease without esophagitis: Secondary | ICD-10-CM

## 2013-03-10 DIAGNOSIS — I10 Essential (primary) hypertension: Secondary | ICD-10-CM

## 2013-03-10 DIAGNOSIS — L259 Unspecified contact dermatitis, unspecified cause: Secondary | ICD-10-CM

## 2013-03-10 DIAGNOSIS — F329 Major depressive disorder, single episode, unspecified: Secondary | ICD-10-CM

## 2013-03-10 MED ORDER — HYDROCODONE-ACETAMINOPHEN 5-325 MG PO TABS: 1.0000 | ORAL_TABLET | Freq: Four times a day (QID) | ORAL | Status: DC | PRN

## 2013-03-10 MED ORDER — HYDROCHLOROTHIAZIDE 25 MG PO TABS: 25.0000 mg | ORAL_TABLET | Freq: Every day | ORAL | Status: DC

## 2013-03-10 MED ORDER — SPIRONOLACTONE 50 MG PO TABS: 25.0000 mg | ORAL_TABLET | Freq: Every day | ORAL | Status: DC

## 2013-03-10 MED ORDER — AMITRIPTYLINE HCL 50 MG PO TABS: 50.0000 mg | ORAL_TABLET | Freq: Every day | ORAL | Status: DC

## 2013-03-10 MED ORDER — FLUOXETINE HCL 40 MG PO CAPS: 40.0000 mg | ORAL_CAPSULE | Freq: Every day | ORAL | Status: DC

## 2013-03-10 MED ORDER — AMLODIPINE BESYLATE 5 MG PO TABS: 5.0000 mg | ORAL_TABLET | Freq: Every day | ORAL | Status: DC

## 2013-03-10 MED ORDER — MULTIVITAMINS PO CAPS: 1.0000 | ORAL_CAPSULE | Freq: Every day | ORAL | Status: DC

## 2013-03-10 MED ORDER — FLUOXETINE HCL 40 MG PO CAPS: 40.0000 mg | ORAL_CAPSULE | Freq: Every day | ORAL | Status: AC

## 2013-03-10 MED ORDER — HYDROCORTISONE 1 % EX CREA: TOPICAL_CREAM | CUTANEOUS | Status: AC

## 2013-03-10 MED ORDER — METOPROLOL TARTRATE 100 MG PO TABS: 100.0000 mg | ORAL_TABLET | Freq: Two times a day (BID) | ORAL | Status: DC

## 2013-03-10 MED ORDER — CLONIDINE HCL 0.1 MG PO TABS: 0.1000 mg | ORAL_TABLET | Freq: Two times a day (BID) | ORAL | Status: DC

## 2013-03-10 MED ORDER — ALBUTEROL SULFATE HFA 108 (90 BASE) MCG/ACT IN AERS: 2.0000 | INHALATION_SPRAY | Freq: Four times a day (QID) | RESPIRATORY_TRACT | Status: DC | PRN

## 2013-03-10 MED ORDER — ESOMEPRAZOLE MAGNESIUM 40 MG PO CPDR: 40.0000 mg | DELAYED_RELEASE_CAPSULE | Freq: Every day | ORAL | Status: DC

## 2013-03-10 NOTE — Patient Instructions (Signed)
General Instructions: Please use Hydrocortison cream and it the rash does improve within one week please call the clinic. I have send your medications to her usual pharmacy. -Please followup with your PCP.   Treatment Goals:  Goals (1 Years of Data) as of 03/10/13         As of Today 01/29/13 01/24/13 12/03/12 11/18/12     Blood Pressure    . Blood Pressure < 140/90  133/75 125/71 132/65 128/76 137/73     Lifestyle    . Prevent Falls            Progress Toward Treatment Goals:  Treatment Goal 03/10/2013  Blood pressure at goal  Prevent falls improved    Self Care Goals & Plans:  Self Care Goal 03/10/2013  Manage my medications take my medicines as prescribed; bring my medications to every visit; refill my medications on time  Monitor my health keep track of my blood pressure  Eat healthy foods drink diet soda or water instead of juice or soda; eat foods that are low in salt  Be physically active (No Data)       Care Management & Community Referrals:  Referral 03/10/2013  Referrals made to community resources falls prevention

## 2013-03-10 NOTE — Progress Notes (Signed)
Patient ID: Heather [name], female   DOB: 03-Nov-1940, 72 y.o.   MRN: [id number]  HPI: [name] is a 72 y.o. with past medical history outlined below, presents with complaints of a rash under her thighs for one month and also requests refill her medications, which were sent to wrong pharmacy.  She describes noticed a rash between her thighs about one month ago. The rash is itchy, but does not keep her awake at night. She tries to desist the urge to scratch. She believes it is extending to involve the back of her thighs. She denies involvement any other areas of her body. She does not have any itching between her toes. No fevers, chills, or rigors. She has not tried to use any remedies for this rash. She denies any recent insect bites, use of new cosmetics or soaps. She reports a similar episode of a rash, which was generalized and resolved with a cream.    Past Medical History  Diagnosis Date  . Depression     Controlled with SSRI  . HTN (hypertension)   . GERD (gastroesophageal reflux disease)     Chronic PPI's  . S/P dilatation of esophageal stricture     7/12 Dr Leone Payor. Dilation of tight stricture proximal eso.  Prior dilation 09 and 08.  . Seasonal allergies     Zyrtec D PRN  . NASH (nonalcoholic steatohepatitis)     Per [place] 10/10  . History of PSVT (paroxysmal supraventricular tachycardia)     Remote and self limited.  . Gout     No crystal dx. R podegra x 3 episodes around 2010. Did not take the allopurinol that was rec 2011.  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 Chronic venous insufficiency     Present for years. Norvasc D/C'd 2012 thinking it might be contributing.  . Migraines   . Glaucoma   . Neuropathy   . Back pain   . Muscle weakness    Current Outpatient Prescriptions  Medication Sig Dispense Refill  . albuterol (PROAIR HFA) 108 (90 BASE) MCG/ACT inhaler Inhale 2 puffs into the lungs  every 6 (six) hours as needed for shortness of breath.  1 Inhaler  1  . amitriptyline (ELAVIL) 50 MG tablet Take 1 tablet (50 mg total) by mouth at bedtime.  90 tablet  3  . amLODipine (NORVASC) 5 MG tablet Take 1 tablet (5 mg total) by mouth daily.  90 tablet  3  . Cetirizine HCl 10 MG CAPS Take 1 capsule (10 mg total) by mouth daily.  90 capsule  0  . cloNIDine (CATAPRES) 0.1 MG tablet Take 1 tablet (0.1 mg total) by mouth 2 (two) times daily.  180 tablet  3  . esomeprazole (NEXIUM) 40 MG capsule Take 1 capsule (40 mg total) by mouth daily before breakfast.  30 capsule  0  . FLUoxetine (PROZAC) 40 MG capsule Take 1 capsule (40 mg total) by mouth daily.  30 capsule  12  . hydrochlorothiazide (HYDRODIURIL) 25 MG tablet Take 1 tablet (25 mg total) by mouth daily.  90 tablet  3  . HYDROcodone-acetaminophen (NORCO/VICODIN) 5-325 MG per tablet Take 1 tablet by mouth every 6 (six) hours as needed for pain.  20 tablet  0  . hydrocortisone cream 1 % Apply to affected area 2 times daily  30 g  1  . metoprolol (LOPRESSOR) 100 MG tablet Take 1 tablet (100  mg total) by mouth 2 (two) times daily.  60 tablet  3  . Multiple Vitamin (MULTIVITAMIN) capsule Take 1 capsule by mouth daily.  30 capsule  1  . spironolactone (ALDACTONE) 50 MG tablet Take 0.5 tablets (25 mg total) by mouth daily.  90 tablet  3  . timolol (TIMOPTIC) 0.25 % ophthalmic solution Place 1 drop into the left eye 2 (two) times daily.        No current facility-administered medications for this visit.   Family History  Problem Relation Age of Onset  . Heart disease Mother   . Hypertension Mother   . Heart attack Mother   . Heart disease Father   . Hypertension Father   . Stroke Father   . Heart disease Sister   . Hypertension Sister   . Throat cancer Brother   . Hypertension Brother   . Heart disease Brother   . Colon cancer Neg Hx    History   Social History  . Marital Status: Widowed    Spouse Name: N/A    Number of Children:  N/A  . Years of Education: N/A   Social History Main Topics  . Smoking status: Former Smoker    Types: Cigarettes    Quit date: 08/07/1968  . Smokeless tobacco: Never Used  . Alcohol Use: No  . Drug Use: No  . Sexually Active: No   Other Topics Concern  . None   Social History Narrative  . None    Review of Systems: Constitutional: Denies fever, chills, diaphoresis, appetite change and fatigue.  Respiratory: Denies SOB, DOE, cough, chest tightness, and wheezing.  Cardiovascular: No chest pain, palpitations and leg swelling.  Gastrointestinal: No abdominal pain, nausea, vomiting, bloody stools Genitourinary: No dysuria, frequency, hematuria, or flank pain.  Musculoskeletal/Neurologic: She has chronic gait abnormalities and is being followed by a neurology. She denies any new symptoms of tingling or numbness. However, she had an episode of a fall a few days ago. No injuries were sustained.  Objective:  Physical Exam: Filed Vitals:   03/10/13 1447  BP: 133/75  Pulse: 80  Temp: 97.2 F (36.2 C)  TempSrc: Oral  Height: 5\' 10"  (1.778 m)  Weight: 201 lb (91.173 kg)  SpO2: 97%   General: Well nourished. No acute distress.  Lungs: CTA bilaterally. Heart: RRR; no extra sounds or murmurs  Abdomen: Non-distended, normal BS, soft, nontender; no hepatosplenomegaly  Extremities: No pedal edema. No joint swelling or tenderness. Neurologic: Alert and oriented x3. No obvious neurologic deficits Skin: Medial upper thighs bilaterally demonstrate plaques/macular areas of hyperpigmentation measuring about less than 1 cm in diameter. There are 3 such spots on the left side and 2 on the right side. They lack features of raised edges, easy bleeding, and there is no area of maculopapular rash.  Assessment & Plan:  I have discussed my assessment and plan  with Dr. Criselda Peaches  as detailed under problem based charting.    In brief, I prescribed hydrocortisone cream, 1%, (the rash is most likely  dermatitis) and also refilled her medications as requested. I have encouraged her to call the clinic if the rash does not improve within one week. She will followup with her PCP.

## 2013-03-10 NOTE — Assessment & Plan Note (Signed)
BP Readings from Last 3 Encounters:  03/10/13 133/75  01/29/13 125/71  01/24/13 132/65    Lab Results  Component Value Date   NA 138 11/18/2012   K 3.9 11/18/2012   CREATININE 0.53 11/18/2012    Assessment: Blood pressure control: controlled Progress toward BP goal:  at goal Comments:   Plan: Medications:  continue current medications Educational resources provided: handout Self management tools provided: home blood pressure logbook;other (see comments) Other plans: refilled her medications

## 2013-03-10 NOTE — Assessment & Plan Note (Signed)
The physical appearance of the rash is most consistent with dermatitis. I do not suspect fungal infection. She does not have systemic symptoms like fevers or chills. She has a similar episode which was treated by a cream several years ago. I have prescribed hydrocortisone cream 1% to apply for about a week and if she doesn't see an improvement to call the clinic for further evaluation.

## 2013-03-10 NOTE — Assessment & Plan Note (Signed)
No current symptoms. Refilled Prozac.

## 2013-03-11 NOTE — Progress Notes (Signed)
I saw and evaluated the patient.  I personally confirmed the key portions of the history and exam documented by Dr. Zada Girt and I reviewed pertinent patient test results.  The assessment, diagnosis, and plan were formulated together and I agree with the documentation in the resident's note.  I evaluated Heather [name]'s rash with Dr. Zada Girt.  I agree that it is somewhat non-descript.  It is composed of 4-5 discreet plaques which are itchy and hyperpigmented.  She has had success with a similar rash from a "cream" of some sort before.  Agree with Dr. Charm Rings initial plan with further workup pending evaluation after this medication was given.

## 2013-04-21 ENCOUNTER — Other Ambulatory Visit: Payer: Self-pay | Admitting: *Deleted

## 2013-04-21 DIAGNOSIS — K219 Gastro-esophageal reflux disease without esophagitis: Secondary | ICD-10-CM

## 2013-04-28 ENCOUNTER — Ambulatory Visit (INDEPENDENT_AMBULATORY_CARE_PROVIDER_SITE_OTHER): Payer: Medicare PPO | Admitting: Internal Medicine

## 2013-04-28 ENCOUNTER — Other Ambulatory Visit: Payer: Self-pay | Admitting: Internal Medicine

## 2013-04-28 ENCOUNTER — Encounter: Payer: Self-pay | Admitting: Internal Medicine

## 2013-04-28 VITALS — BP 131/76 | HR 74 | Temp 97.8°F | Ht 70.0 in | Wt 198.2 lb

## 2013-04-28 DIAGNOSIS — Z0000 Encounter for general adult medical examination without abnormal findings: Secondary | ICD-10-CM

## 2013-04-28 DIAGNOSIS — Z23 Encounter for immunization: Secondary | ICD-10-CM

## 2013-04-28 DIAGNOSIS — I10 Essential (primary) hypertension: Secondary | ICD-10-CM

## 2013-04-28 DIAGNOSIS — N390 Urinary tract infection, site not specified: Secondary | ICD-10-CM | POA: Insufficient documentation

## 2013-04-28 MED ORDER — CIPROFLOXACIN HCL 500 MG PO TABS: 500.0000 mg | ORAL_TABLET | Freq: Two times a day (BID) | ORAL | Status: DC

## 2013-04-28 NOTE — Progress Notes (Signed)
Patient ID: Heather [name], female   DOB: 05-19-1941, 72 y.o.   MRN: [id number] Subjective:   Patient ID: Heather [name] female   DOB: Apr 26, 1941 72 y.o.   MRN: [id number]  CC:   Acute visit due to UTI HPI:  Heather [name] is a 72 y.o. lady with past medical history as outlined below, who present for an acute visit today.  1. UTI:  Patient reports having dysuria and increased urinary frequency in the past 4 days. She also has a suprapubic discomfort in the past 4 days. She denies fever or chills. No flank pain. She has not noticed any blood in her urine.   2. HTN: Patient isn't currently taking amlodipine 5 mg daily, clonidine 0.1 mg twice a day, hydrochlorothiazide 25 mg daily, metoprolol 100 mg twice a day and spironolactone 50 mg daily. Today her blood pressure is normal. Patient does not have chest pain, shortness of breath, palpitation or leg edema.   ROS:  Denies fever, chills, headaches, cough, chest pain, SOB,  abdominal pain, diarrhea, constipation, hematuria.    Past Medical History  Diagnosis Date  . Depression     Controlled with SSRI  . HTN (hypertension)   . GERD (gastroesophageal reflux disease)     Chronic PPI's  . S/P dilatation of esophageal stricture     7/12 Dr Leone Payor. Dilation of tight stricture proximal eso.  Prior dilation 09 and 08.  . Seasonal allergies     Zyrtec D PRN  . NASH (nonalcoholic steatohepatitis)     Per [place] 10/10  . History of PSVT (paroxysmal supraventricular tachycardia)     Remote and self limited.  . Gout     No crystal dx. R podegra x 3 episodes around 2010. Did not take the allopurinol that was rec 2011.  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 Chronic venous insufficiency     Present for years. Norvasc D/C'd 2012 thinking it might be contributing.  . Migraines   . Glaucoma   . Neuropathy   . Back pain   . Muscle weakness    Current Outpatient  Prescriptions  Medication Sig Dispense Refill  . albuterol (PROAIR HFA) 108 (90 BASE) MCG/ACT inhaler Inhale 2 puffs into the lungs every 6 (six) hours as needed for shortness of breath.  1 Inhaler  1  . amitriptyline (ELAVIL) 50 MG tablet Take 1 tablet (50 mg total) by mouth at bedtime.  90 tablet  3  . amLODipine (NORVASC) 5 MG tablet Take 1 tablet (5 mg total) by mouth daily.  90 tablet  3  . Cetirizine HCl 10 MG CAPS Take 1 capsule (10 mg total) by mouth daily.  90 capsule  0  . cloNIDine (CATAPRES) 0.1 MG tablet Take 1 tablet (0.1 mg total) by mouth 2 (two) times daily.  180 tablet  3  . esomeprazole (NEXIUM) 40 MG capsule Take 1 capsule (40 mg total) by mouth daily before breakfast.  30 capsule  0  . FLUoxetine (PROZAC) 40 MG capsule Take 1 capsule (40 mg total) by mouth daily.  30 capsule  12  . hydrochlorothiazide (HYDRODIURIL) 25 MG tablet Take 1 tablet (25 mg total) by mouth daily.  90 tablet  3  . HYDROcodone-acetaminophen (NORCO/VICODIN) 5-325 MG per tablet Take 1 tablet by mouth every 6 (six) hours as needed for pain.  20 tablet  0  . hydrocortisone cream 1 % Apply to  affected area 2 times daily  30 g  1  . metoprolol (LOPRESSOR) 100 MG tablet Take 1 tablet (100 mg total) by mouth 2 (two) times daily.  60 tablet  3  . Multiple Vitamin (MULTIVITAMIN) capsule Take 1 capsule by mouth daily.  30 capsule  1  . spironolactone (ALDACTONE) 50 MG tablet Take 0.5 tablets (25 mg total) by mouth daily.  90 tablet  3  . timolol (TIMOPTIC) 0.25 % ophthalmic solution Place 1 drop into the left eye 2 (two) times daily.       . ciprofloxacin (CIPRO) 500 MG tablet Take 1 tablet (500 mg total) by mouth 2 (two) times daily.  6 tablet  0   No current facility-administered medications for this visit.   Family History  Problem Relation Age of Onset  . Heart disease Mother   . Hypertension Mother   . Heart attack Mother   . Heart disease Father   . Hypertension Father   . Stroke Father   . Heart  disease Sister   . Hypertension Sister   . Throat cancer Brother   . Hypertension Brother   . Heart disease Brother   . Colon cancer Neg Hx    History   Social History  . Marital Status: Widowed    Spouse Name: N/A    Number of Children: N/A  . Years of Education: N/A   Social History Main Topics  . Smoking status: Former Smoker    Types: Cigarettes    Quit date: 08/07/1968  . Smokeless tobacco: Never Used  . Alcohol Use: No  . Drug Use: No  . Sexual Activity: No   Other Topics Concern  . None   Social History Narrative  . None    Review of Systems: Full 14-point review of systems otherwise negative. See HPI.   Objective:  Physical Exam: Filed Vitals:   04/28/13 1456  BP: 131/76  Pulse: 74  Temp: 97.8 F (36.6 C)  TempSrc: Oral  Height: 5\' 10"  (1.778 m)  Weight: 198 lb 3.2 oz (89.903 kg)  SpO2: 98%   Constitutional: Vital signs reviewed. No acute distress and cooperative with exam.   HEENT:  Head: Normocephalic and atraumatic Mouth: no erythema or exudates, MMM Eyes: PERRL, EOMI, conjunctivae normal, No scleral icterus.  Neck: Supple, Trachea midline normal ROM, No JVD  Cardiovascular: RRR, S1 normal, S2 normal, no MRG, pulses symmetric and intact bilaterally Pulmonary/Chest: CTAB, no wheezes, rales, or rhonchi Abdominal: Soft. Non-tender, non-distended, bowel sounds are normal, no masses, organomegaly, or guarding present.  GU: No CVA tenderness Musculoskeletal: No joint deformities, erythema, or stiffness, ROM full and non-tender Extremities: No leg edema Hematology: no cervical, inginal, or axillary adenopathy.  Neurological: A&O x3, Strength is normal and symmetric bilaterally, cranial nerve II-XII are grossly intact, no focal motor deficit, sensory intact to light touch bilaterally.  Skin: Warm, dry and intact. No rash, cyanosis, or clubbing.  Psychiatric: Normal mood and affect. No suicidal or homicidal ideation.  Assessment & Plan:

## 2013-04-28 NOTE — Assessment & Plan Note (Signed)
Her blood pressure is well controlled. Today blood pressure is normal. Will continue current regimen.

## 2013-04-28 NOTE — Assessment & Plan Note (Signed)
Patient symptoms are consistent with UTI. She does not have CVA tenderness, no fever or chills, indicating less likely that patient has pyelonephritis. There is no signs of sepsis currently.  -Will treat patient empirically with ciprofloxacin 500 mg for 3 days. Patient is allergic to several medications, I am concerned that patient might be allergic to first line medications, such as Bactrim.  -We get urinalysis and urine culture.

## 2013-04-28 NOTE — Patient Instructions (Signed)
1. it is most likely that your symptoms are caused by urinary tract infection. Please take ciprofloxacin 500 mg twice a day for 3 days.  2. Please take all medications as prescribed.  3. If you have worsening of your symptoms or new symptoms arise, please call the clinic ([phone]), or go to the ER immediately if symptoms are severe.  Urinary Tract Infection Urinary tract infections (UTIs) can develop anywhere along your urinary tract. Your urinary tract is your body's drainage system for removing wastes and extra water. Your urinary tract includes two kidneys, two ureters, a bladder, and a urethra. Your kidneys are a pair of bean-shaped organs. Each kidney is about the size of your fist. They are located below your ribs, one on each side of your spine. CAUSES Infections are caused by microbes, which are microscopic organisms, including fungi, viruses, and bacteria. These organisms are so small that they can only be seen through a microscope. Bacteria are the microbes that most commonly cause UTIs. SYMPTOMS  Symptoms of UTIs may vary by age and gender of the patient and by the location of the infection. Symptoms in young women typically include a frequent and intense urge to urinate and a painful, burning feeling in the bladder or urethra during urination. Older women and men are more likely to be tired, shaky, and weak and have muscle aches and abdominal pain. A fever may mean the infection is in your kidneys. Other symptoms of a kidney infection include pain in your back or sides below the ribs, nausea, and vomiting. DIAGNOSIS To diagnose a UTI, your caregiver will ask you about your symptoms. Your caregiver also will ask to provide a urine sample. The urine sample will be tested for bacteria and white blood cells. White blood cells are made by your body to help fight infection. TREATMENT  Typically, UTIs can be treated with medication. Because most UTIs are caused by a bacterial infection, they  usually can be treated with the use of antibiotics. The choice of antibiotic and length of treatment depend on your symptoms and the type of bacteria causing your infection. HOME CARE INSTRUCTIONS  If you were prescribed antibiotics, take them exactly as your caregiver instructs you. Finish the medication even if you feel better after you have only taken some of the medication.  Drink enough water and fluids to keep your urine clear or pale yellow.  Avoid caffeine, tea, and carbonated beverages. They tend to irritate your bladder.  Empty your bladder often. Avoid holding urine for long periods of time.  Empty your bladder before and after sexual intercourse.  After a bowel movement, women should cleanse from front to back. Use each tissue only once. SEEK MEDICAL CARE IF:   You have back pain.  You develop a fever.  Your symptoms do not begin to resolve within 3 days. SEEK IMMEDIATE MEDICAL CARE IF:   You have severe back pain or lower abdominal pain.  You develop chills.  You have nausea or vomiting.  You have continued burning or discomfort with urination. MAKE SURE YOU:   Understand these instructions.  Will watch your condition.  Will get help right away if you are not doing well or get worse. Document Released: 05/03/2005 Document Revised: 01/23/2012 Document Reviewed: 09/01/2011 [facility] Patient Information 2014 [place], [place].

## 2013-04-28 NOTE — Assessment & Plan Note (Signed)
-  will give flu shot today.  

## 2013-04-29 LAB — URINALYSIS, ROUTINE W REFLEX MICROSCOPIC
Protein, ur: NEGATIVE mg/dL
Urobilinogen, UA: 1 mg/dL (ref 0.0–1.0)

## 2013-04-29 LAB — URINALYSIS, MICROSCOPIC ONLY
Bacteria, UA: NONE SEEN
Crystals: NONE SEEN

## 2013-04-29 NOTE — Progress Notes (Signed)
Case discussed with Dr. Niu at the time of the visit.  We reviewed the resident's history and exam and pertinent patient test results.  I agree with the assessment, diagnosis, and plan of care documented in the resident's note.    

## 2013-04-30 LAB — URINE CULTURE

## 2013-05-01 ENCOUNTER — Encounter: Payer: Self-pay | Admitting: Internal Medicine

## 2013-05-01 NOTE — Progress Notes (Signed)
Patient ID: Heather [name], female   DOB: 1941-04-18, 72 y.o.   MRN: [id number] This patient has been identified as a high fall risk and is a female that is 65+.  Please consider ordering a DEXA scan to screen for osteoporosis.

## 2013-05-05 ENCOUNTER — Encounter (HOSPITAL_COMMUNITY): Payer: Self-pay | Admitting: Adult Health

## 2013-05-05 DIAGNOSIS — Z9889 Other specified postprocedural states: Secondary | ICD-10-CM | POA: Insufficient documentation

## 2013-05-05 DIAGNOSIS — K219 Gastro-esophageal reflux disease without esophagitis: Secondary | ICD-10-CM | POA: Insufficient documentation

## 2013-05-05 DIAGNOSIS — R1310 Dysphagia, unspecified: Secondary | ICD-10-CM | POA: Insufficient documentation

## 2013-05-05 DIAGNOSIS — K222 Esophageal obstruction: Secondary | ICD-10-CM | POA: Insufficient documentation

## 2013-05-05 DIAGNOSIS — Z79899 Other long term (current) drug therapy: Secondary | ICD-10-CM | POA: Insufficient documentation

## 2013-05-05 DIAGNOSIS — IMO0002 Reserved for concepts with insufficient information to code with codable children: Secondary | ICD-10-CM | POA: Insufficient documentation

## 2013-05-05 DIAGNOSIS — F3289 Other specified depressive episodes: Secondary | ICD-10-CM | POA: Insufficient documentation

## 2013-05-05 DIAGNOSIS — I10 Essential (primary) hypertension: Secondary | ICD-10-CM | POA: Insufficient documentation

## 2013-05-05 DIAGNOSIS — G43909 Migraine, unspecified, not intractable, without status migrainosus: Secondary | ICD-10-CM | POA: Insufficient documentation

## 2013-05-05 DIAGNOSIS — Z87891 Personal history of nicotine dependence: Secondary | ICD-10-CM | POA: Insufficient documentation

## 2013-05-05 DIAGNOSIS — Z91040 Latex allergy status: Secondary | ICD-10-CM | POA: Insufficient documentation

## 2013-05-05 DIAGNOSIS — T18108A Unspecified foreign body in esophagus causing other injury, initial encounter: Secondary | ICD-10-CM | POA: Insufficient documentation

## 2013-05-05 DIAGNOSIS — K319 Disease of stomach and duodenum, unspecified: Secondary | ICD-10-CM | POA: Insufficient documentation

## 2013-05-05 DIAGNOSIS — M109 Gout, unspecified: Secondary | ICD-10-CM | POA: Insufficient documentation

## 2013-05-05 DIAGNOSIS — Z8669 Personal history of other diseases of the nervous system and sense organs: Secondary | ICD-10-CM | POA: Insufficient documentation

## 2013-05-05 DIAGNOSIS — D131 Benign neoplasm of stomach: Secondary | ICD-10-CM | POA: Insufficient documentation

## 2013-05-05 DIAGNOSIS — F329 Major depressive disorder, single episode, unspecified: Secondary | ICD-10-CM | POA: Insufficient documentation

## 2013-05-05 DIAGNOSIS — K224 Dyskinesia of esophagus: Secondary | ICD-10-CM | POA: Insufficient documentation

## 2013-05-05 NOTE — ED Notes (Addendum)
Presents with difficulty swallowing that began yesterday, she went and UCC yesterday in [place] and was told everything is okay, HX of having her esophagus widened and difficulty swallowing in the past. Reports, "the food just won't go down" she is alert and oriented, answers all questions appropriately. No facial droop, bilateral grips equal, no arm drift.  Only complains of pain with swallowing.

## 2013-05-06 ENCOUNTER — Encounter: Payer: Self-pay | Admitting: Physician Assistant

## 2013-05-06 ENCOUNTER — Telehealth: Payer: Self-pay | Admitting: Internal Medicine

## 2013-05-06 ENCOUNTER — Other Ambulatory Visit: Payer: Self-pay | Admitting: *Deleted

## 2013-05-06 ENCOUNTER — Ambulatory Visit (HOSPITAL_COMMUNITY)
Admission: RE | Admit: 2013-05-06 | Discharge: 2013-05-06 | Disposition: A | Discharge status: Home / Self Care | Payer: Medicare PPO | Source: Ambulatory Visit | Attending: Internal Medicine | Admitting: Internal Medicine

## 2013-05-06 ENCOUNTER — Ambulatory Visit (INDEPENDENT_AMBULATORY_CARE_PROVIDER_SITE_OTHER): Payer: Medicare PPO | Admitting: Physician Assistant

## 2013-05-06 ENCOUNTER — Emergency Department (HOSPITAL_COMMUNITY)
Admission: EM | Admit: 2013-05-06 | Discharge: 2013-05-06 | Disposition: A | Discharge status: Home / Self Care | Payer: Medicare PPO | Source: Home / Self Care | Attending: Emergency Medicine | Admitting: Emergency Medicine

## 2013-05-06 ENCOUNTER — Encounter (HOSPITAL_COMMUNITY): Payer: Self-pay | Admitting: *Deleted

## 2013-05-06 ENCOUNTER — Encounter (HOSPITAL_COMMUNITY)
Admission: RE | Disposition: A | Discharge status: Home / Self Care | Payer: Self-pay | Source: Ambulatory Visit | Attending: Internal Medicine

## 2013-05-06 VITALS — BP 130/80 | HR 100 | Ht 69.0 in | Wt 191.0 lb

## 2013-05-06 DIAGNOSIS — M3320 Polymyositis, organ involvement unspecified: Secondary | ICD-10-CM

## 2013-05-06 DIAGNOSIS — Z8719 Personal history of other diseases of the digestive system: Secondary | ICD-10-CM

## 2013-05-06 DIAGNOSIS — R1310 Dysphagia, unspecified: Secondary | ICD-10-CM

## 2013-05-06 DIAGNOSIS — R1312 Dysphagia, oropharyngeal phase: Secondary | ICD-10-CM

## 2013-05-06 DIAGNOSIS — T18128S Food in esophagus causing other injury, sequela: Secondary | ICD-10-CM

## 2013-05-06 DIAGNOSIS — T18108A Unspecified foreign body in esophagus causing other injury, initial encounter: Secondary | ICD-10-CM

## 2013-05-06 DIAGNOSIS — T18128A Food in esophagus causing other injury, initial encounter: Secondary | ICD-10-CM

## 2013-05-06 DIAGNOSIS — K222 Esophageal obstruction: Secondary | ICD-10-CM

## 2013-05-06 HISTORY — PX: ESOPHAGOGASTRODUODENOSCOPY: SHX5428

## 2013-05-06 SURGERY — EGD (ESOPHAGOGASTRODUODENOSCOPY)
Anesthesia: Moderate Sedation

## 2013-05-06 MED ORDER — MIDAZOLAM HCL 10 MG/2ML IJ SOLN
INTRAMUSCULAR | Status: AC
  Filled 2013-05-06: qty 4

## 2013-05-06 MED ORDER — FENTANYL CITRATE 0.05 MG/ML IJ SOLN
INTRAMUSCULAR | Status: DC | PRN
  Administered 2013-05-06 (×3): 25 ug via INTRAVENOUS

## 2013-05-06 MED ORDER — FENTANYL CITRATE 0.05 MG/ML IJ SOLN
INTRAMUSCULAR | Status: AC
  Filled 2013-05-06: qty 4

## 2013-05-06 MED ORDER — MIDAZOLAM HCL 10 MG/2ML IJ SOLN
INTRAMUSCULAR | Status: DC | PRN
  Administered 2013-05-06 (×2): 2 mg via INTRAVENOUS
  Administered 2013-05-06: 1 mg via INTRAVENOUS
  Administered 2013-05-06: 2 mg via INTRAVENOUS

## 2013-05-06 MED ORDER — SUCRALFATE 1 GM/10ML PO SUSP: ORAL | Status: DC

## 2013-05-06 MED ORDER — BUTAMBEN-TETRACAINE-BENZOCAINE 2-2-14 % EX AERO
INHALATION_SPRAY | CUTANEOUS | Status: DC | PRN
  Administered 2013-05-06: 1 via TOPICAL

## 2013-05-06 MED ORDER — SODIUM CHLORIDE 0.9 % IV SOLN: INTRAVENOUS | Status: DC

## 2013-05-06 MED ORDER — GI COCKTAIL ~~LOC~~
30.0000 mL | ORAL | Status: AC
  Administered 2013-05-06: 30 mL via ORAL
  Filled 2013-05-06: qty 30

## 2013-05-06 MED ORDER — DIPHENHYDRAMINE HCL 50 MG/ML IJ SOLN
INTRAMUSCULAR | Status: AC
  Filled 2013-05-06: qty 1

## 2013-05-06 NOTE — Patient Instructions (Addendum)
Dr. Erick Blinks will perform an Endoscopy.[facility]. Have nothing by mouth until after the procedure.  Arrive at 2:00 PM to [facility] Registration, inside front door of hospital.  Take Nexium 1 cap daily. We sent a prescription for Carafate Slurry, take 1 gram 3 times daily between meals.

## 2013-05-06 NOTE — Op Note (Signed)
[facility] [address] [place], [address]   ENDOSCOPY PROCEDURE REPORT  PATIENT: [name], [name]  MR#: [id number] BIRTHDATE: 12/10/1940 , 72  yrs. old GENDER: Female ENDOSCOPIST: Beverley Fiedler, MD PROCEDURE DATE:  05/06/2013 PROCEDURE:  EGD w/ fb removal ASA CLASS:     Class III INDICATIONS:  Dysphagia.  History of cricopharyngeal bar, esophageal stenosis, and dysmotility MEDICATIONS: Versed 7 mg IV, Fentanyl 75 mcg IV, and These medications were titrated to patient response per physician's verbal order TOPICAL ANESTHETIC: Cetacaine Spray  DESCRIPTION OF PROCEDURE: After the risks benefits and alternatives of the procedure were thoroughly explained, informed consent was obtained.  The Pentax Gastroscope [id number] endoscope was introduced through the mouth and advanced to the second portion of the duodenum. Without limitations.  The instrument was slowly withdrawn as the mucosa was fully examined.        ESOPHAGUS: Very small piece of solid food at UES (approx 19 cm from the incisors) able to be removed with suction.  There was mildly increased resistance with passage of the adult upper endoscope at the UES. The esophagus mucosa was otherwise normal.   No definite stricture seen at the GE junction, which is located 40 cm from the incisors.  STOMACH: There was mild antral gastropathy noted.  1 small, likely fundic gland, polyp in the gastric body.  DUODENUM: The duodenal mucosa showed no abnormalities in the bulb and second portion of the duodenum. Retroflexed views revealed no abnormalities.     The scope was then withdrawn from the patient and the procedure completed.  COMPLICATIONS: There were no complications.  ENDOSCOPIC IMPRESSION: 1.   Very small piece of solid food at UES (approx 19 cm from the incisors) able to be removed with suction 2.   The esophagus was otherwise normal. 3.   There was mild antral gastropathy noted; benign  gastric body polyp 4.   The duodenal mucosa showed no abnormalities in the bulb and second portion of the duodenum  RECOMMENDATIONS: 1.  Liquid diet the remainder of today 2.  Soft diet tomorrow with avoidance of meats/bread.  Chew food very well, drink liquids before beginning to eat solids. 3.  Office follow-up with Dr.  Leone Payor 4.  Continue current medications   eSigned:  Beverley Fiedler, MD 05/06/2013 4:46 PM   CC:The Patient and Stan Head, MD  PATIENT NAME:  Heather [name], Heather [name] MR#: [id number]

## 2013-05-06 NOTE — Progress Notes (Addendum)
Subjective:    Patient ID: Heather [name], female    DOB: 10/18/1940, 72 y.o.   MRN: [id number]  HPI  [name] is a pleasant 72-year-old African American female known to Dr. Gessner. 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 She had EGD in 2012 with Maloney dilation and had EGD in January of 2014 for recurrent dysphagia and was dilated with a 54 [place] Maloney. It was felt that she would need manometry if she had persistent problems with dysphagia. 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 She relates rather diffuse weakness and has required a walker for ambulation. She states that her last dilation did not seem to make much difference for very long and she's had recurrent problems with mostly solid food dysphagia for several months. She had an episode on Sunday morning 05/04/2013 after she had eaten a piece of pizza for breakfast. She feels as this if a piece of the pizza became lodged in her esophagus and she had discomfort on the left side of her neck. She said she was uncomfortable for multiple hours and felt that it eventually moved to a different spot in her esophagus but was not sure whether it had gone all the way down. She did not try to eat or drink anything else the rest of that day. She did have some coughing and developed some hoarseness but did not have any regurgitation. She apparently had gone to an urgent care and another town and had an x-ray done and was told he could not see anything in her esophagus. Yesterday she tried sipping on glass of milk and over a long period of time was able to keep the glass of milk down however ever since then everything that she has tried to swallow including water and saliva seemed to be eventually "bubbling" backup. He continues to have discomfort and fullness in her mid esophagus.  She went to the emergency room last night was given a GI cocktail which apparently she was felt to have gotten down. Patient says she only had a sip of it and then eventually bubbled back  up as well. She has been unable to lie down comfortably and has not tried to eat or drink anything.    Review of Systems  Constitutional: Positive for activity change and fatigue.  HENT: Positive for trouble swallowing.   Eyes: Negative.   Respiratory: Positive for cough.   Cardiovascular: Negative.   Gastrointestinal: Negative.   Endocrine: Negative.   Genitourinary: Negative.   Musculoskeletal: Positive for gait problem.  Skin: Negative.   Allergic/Immunologic: Negative.   Neurological: Positive for weakness.  Hematological: Negative.   Psychiatric/Behavioral: Negative.    Outpatient Prescriptions Prior to Visit  Medication Sig Dispense Refill  . albuterol (PROAIR HFA) 108 (90 BASE) MCG/ACT inhaler Inhale 2 puffs into the lungs every 6 (six) hours as needed for shortness of breath.  1 Inhaler  1  . amitriptyline (ELAVIL) 50 MG tablet Take 1 tablet (50 mg total) by mouth at bedtime.  90 tablet  3  . amLODipine (NORVASC) 5 MG tablet Take 1 tablet (5 mg total) by mouth daily.  90 tablet  3  . Cetirizine HCl 10 MG CAPS Take 1 capsule (10 mg total) by mouth daily.  90 capsule  0  . ciprofloxacin (CIPRO) 500 MG tablet Take 1 tablet (500 mg   total) by mouth 2 (two) times daily.  6 tablet  0  . cloNIDine (CATAPRES) 0.1 MG tablet Take 1 tablet (0.1 mg total) by mouth 2 (two) times daily.  180 tablet  3  . esomeprazole (NEXIUM) 40 MG capsule Take 1 capsule (40 mg total) by mouth daily before breakfast.  30 capsule  0  . FLUoxetine (PROZAC) 40 MG capsule Take 1 capsule (40 mg total) by mouth daily.  30 capsule  12  . hydrochlorothiazide (HYDRODIURIL) 25 MG tablet Take 1 tablet (25 mg total) by mouth daily.  90 tablet  3  . HYDROcodone-acetaminophen (NORCO/VICODIN) 5-325 MG per tablet Take 1 tablet by mouth  every 6 (six) hours as needed for pain.  20 tablet  0  . hydrocortisone cream 1 % Apply to affected area 2 times daily  30 g  1  . metoprolol (LOPRESSOR) 100 MG tablet Take 1 tablet (100 mg total) by mouth 2 (two) times daily.  60 tablet  3  . Multiple Vitamin (MULTIVITAMIN) capsule Take 1 capsule by mouth daily.  30 capsule  1  . spironolactone (ALDACTONE) 50 MG tablet Take 0.5 tablets (25 mg total) by mouth daily.  90 tablet  3  . timolol (TIMOPTIC) 0.25 % ophthalmic solution Place 1 drop into the left eye 2 (two) times daily.        No facility-administered medications prior to visit.   Allergies  Allergen Reactions  . Propoxyphene Napsylate Nausea Only  . Darvon   . Latex     Per Patient latex=rash "hands look burned"   Patient Active Problem List   Diagnosis Date Noted  . UTI (lower urinary tract infection) 04/28/2013  . Dermatitis 03/10/2013  . 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 Abnormality of gait 11/13/2012  . Muscle weakness 09/17/2012  . Elevated CK-MB level 09/17/2012  . Acute bronchitis 09/17/2012  . Chronic sore throat 08/14/2012  . Chronic cough 08/14/2012  . 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 Swollen lymph nodes 05/28/2012  . Other dysphagia 02/13/2011  . Routine health maintenance 11/08/2010  . History of PSVT (paroxysmal supraventricular tachycardia) 09/08/2010  . NASH (nonalcoholic steatohepatitis)   . Gout   . LGSIL (low grade squamous intraepithelial lesion) on Pap smear   . ESOPHAGEAL STENOSIS 06/25/2008  . DEPRESSION 11/16/2006  . GLAUCOMA NOS 11/16/2006  . HYPERTENSION 11/16/2006  . ALLERGIC RHINITIS, SEASONAL 11/16/2006  . GERD 11/16/2006   History  Substance Use Topics  . Smoking status: Former Smoker    Types: Cigarettes    Quit date: 08/07/1968  . Smokeless tobacco: Never Used  . Alcohol Use: No   family history includes Heart attack in her mother; Heart disease in her brother, father, mother, and sister;  Hypertension in her brother, father, mother, and sister; Stroke in her father; Throat cancer in her brother. There is no history of Colon cancer.     Objective:   Physical Exam  well-developed older African American female in no acute distress sitting upright in a rolling walker, pleasant somewhat hoarse. Blood pressure 130/80 pulse 100 height 5 foot 9 weight 191. 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 Patient is status post prior EGDs with Maloney dilations. 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 Pirtle this afternoon at [facility]. She will continue Nexium 40 mg by mouth daily Call in a prescription for Carafate slurry 1 g 3 times daily between meals over the next 10 days Patient was instructed to stay on a clear liquid diet for least 24 hours after the endoscopy and very gradually work her  way up and to avoid meats and breads until her esophagus is either dilated or further evaluated. Decision regarding followup EGD with dilation with Dr. Gessner versus manometry will be based on findings at EGD today  Addendum: Reviewed and agree with initial management. Patient seen and  examined here in the outpatient hospital setting.  Plan for EGD today for possible food impaction.The nature of the procedure, as well as the risks, benefits, and alternatives were carefully and thoroughly reviewed with the patient. Ample time for discussion and questions allowed. The patient understood, was satisfied, and agreed to proceed.    Jay M Pyrtle, MD   

## 2013-05-06 NOTE — H&P (View-Only) (Signed)
Subjective:    Patient ID: Heather [name], female    DOB: 04/29/1941, 72 y.o.   MRN: [id number]  HPI  Heather [name] is a pleasant 72 year old African American female known to Dr. Leone Payor. 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 She had EGD in 2012 with [facility] dilation and had EGD in January of 2014 for recurrent dysphagia and was dilated with a 62 [place] Maloney. It was felt that she would need manometry if she had persistent problems with dysphagia. 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 She relates rather diffuse weakness and has required a walker for ambulation. She states that her last dilation did not seem to make much difference for very long and she's had recurrent problems with mostly solid food dysphagia for several months. She had an episode on Sunday morning 05/04/2013 after she had eaten a piece of pizza for breakfast. She feels as this if a piece of the pizza became lodged in her esophagus and she had discomfort on the left side of her neck. She said she was uncomfortable for multiple hours and felt that it eventually moved to a different spot in her esophagus but was not sure whether it had gone all the way down. She did not try to eat or drink anything else the rest of that day. She did have some coughing and developed some hoarseness but did not have any regurgitation. She apparently had gone to an urgent care and another town and had an x-ray done and was told he could not see anything in her esophagus. Yesterday she tried sipping on glass of milk and over a long period of time was able to keep the glass of milk down however ever since then everything that she has tried to swallow including water and saliva seemed to be eventually "bubbling" backup. He continues to have discomfort and fullness in her mid esophagus.  She went to the emergency room last night was given a GI cocktail which apparently she was felt to have gotten down. Patient says she only had a sip of it and then eventually bubbled back  up as well. She has been unable to lie down comfortably and has not tried to eat or drink anything.    Review of Systems  Constitutional: Positive for activity change and fatigue.  HENT: Positive for trouble swallowing.   Eyes: Negative.   Respiratory: Positive for cough.   Cardiovascular: Negative.   Gastrointestinal: Negative.   Endocrine: Negative.   Genitourinary: Negative.   Musculoskeletal: Positive for gait problem.  Skin: Negative.   Allergic/Immunologic: Negative.   Neurological: Positive for weakness.  Hematological: Negative.   Psychiatric/Behavioral: Negative.    Outpatient Prescriptions Prior to Visit  Medication Sig Dispense Refill  . albuterol (PROAIR HFA) 108 (90 BASE) MCG/ACT inhaler Inhale 2 puffs into the lungs every 6 (six) hours as needed for shortness of breath.  1 Inhaler  1  . amitriptyline (ELAVIL) 50 MG tablet Take 1 tablet (50 mg total) by mouth at bedtime.  90 tablet  3  . amLODipine (NORVASC) 5 MG tablet Take 1 tablet (5 mg total) by mouth daily.  90 tablet  3  . Cetirizine HCl 10 MG CAPS Take 1 capsule (10 mg total) by mouth daily.  90 capsule  0  . ciprofloxacin (CIPRO) 500 MG tablet Take 1 tablet (500 mg  total) by mouth 2 (two) times daily.  6 tablet  0  . cloNIDine (CATAPRES) 0.1 MG tablet Take 1 tablet (0.1 mg total) by mouth 2 (two) times daily.  180 tablet  3  . esomeprazole (NEXIUM) 40 MG capsule Take 1 capsule (40 mg total) by mouth daily before breakfast.  30 capsule  0  . FLUoxetine (PROZAC) 40 MG capsule Take 1 capsule (40 mg total) by mouth daily.  30 capsule  12  . hydrochlorothiazide (HYDRODIURIL) 25 MG tablet Take 1 tablet (25 mg total) by mouth daily.  90 tablet  3  . HYDROcodone-acetaminophen (NORCO/VICODIN) 5-325 MG per tablet Take 1 tablet by mouth  every 6 (six) hours as needed for pain.  20 tablet  0  . hydrocortisone cream 1 % Apply to affected area 2 times daily  30 g  1  . metoprolol (LOPRESSOR) 100 MG tablet Take 1 tablet (100 mg total) by mouth 2 (two) times daily.  60 tablet  3  . Multiple Vitamin (MULTIVITAMIN) capsule Take 1 capsule by mouth daily.  30 capsule  1  . spironolactone (ALDACTONE) 50 MG tablet Take 0.5 tablets (25 mg total) by mouth daily.  90 tablet  3  . timolol (TIMOPTIC) 0.25 % ophthalmic solution Place 1 drop into the left eye 2 (two) times daily.        No facility-administered medications prior to visit.   Allergies  Allergen Reactions  . Propoxyphene Napsylate Nausea Only  . Darvon   . Latex     Per Patient latex=rash "hands look burned"   Patient Active Problem List   Diagnosis Date Noted  . UTI (lower urinary tract infection) 04/28/2013  . Dermatitis 03/10/2013  . 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 Abnormality of gait 11/13/2012  . Muscle weakness 09/17/2012  . Elevated CK-MB level 09/17/2012  . Acute bronchitis 09/17/2012  . Chronic sore throat 08/14/2012  . Chronic cough 08/14/2012  . 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 Swollen lymph nodes 05/28/2012  . Other dysphagia 02/13/2011  . Routine health maintenance 11/08/2010  . History of PSVT (paroxysmal supraventricular tachycardia) 09/08/2010  . NASH (nonalcoholic steatohepatitis)   . Gout   . LGSIL (low grade squamous intraepithelial lesion) on Pap smear   . ESOPHAGEAL STENOSIS 06/25/2008  . DEPRESSION 11/16/2006  . GLAUCOMA NOS 11/16/2006  . HYPERTENSION 11/16/2006  . ALLERGIC RHINITIS, SEASONAL 11/16/2006  . GERD 11/16/2006   History  Substance Use Topics  . Smoking status: Former Smoker    Types: Cigarettes    Quit date: 08/07/1968  . Smokeless tobacco: Never Used  . Alcohol Use: No   family history includes Heart attack in her mother; Heart disease in her brother, father, mother, and sister;  Hypertension in her brother, father, mother, and sister; Stroke in her father; Throat cancer in her brother. There is no history of Colon cancer.     Objective:   Physical Exam  well-developed older African American female in no acute distress sitting upright in a rolling walker, pleasant somewhat hoarse. Blood pressure 130/80 pulse 100 height 5 foot 9 weight 191. 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 Patient is status post prior EGDs with [facility] dilations. 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 Sharla Kidney this afternoon at [facility]. She will continue Nexium 40 mg by mouth daily Call in a prescription for Carafate slurry 1 g 3 times daily between meals over the next 10 days Patient was instructed to stay on a clear liquid diet for least 24 hours after the endoscopy and very gradually work her  way up and to avoid meats and breads until her esophagus is either dilated or further evaluated. Decision regarding followup EGD with dilation with Dr. Leone Payor versus manometry will be based on findings at EGD today  Addendum: Reviewed and agree with initial management. Patient seen and  examined here in the outpatient hospital setting.  Plan for EGD today for possible food impaction.The nature of the procedure, as well as the risks, benefits, and alternatives were carefully and thoroughly reviewed with the patient. Ample time for discussion and questions allowed. The patient understood, was satisfied, and agreed to proceed.    Beverley Fiedler, MD

## 2013-05-06 NOTE — Telephone Encounter (Signed)
Patient reports continued dysphagia.  Was seen in the ER last night and told to call this am for an appt.  She will come in today at 11:00

## 2013-05-06 NOTE — Interval H&P Note (Signed)
History and Physical Interval Note: See addendum to office note from today by Mike Gip, PA-C  05/06/2013 3:44 PM  Heather [name]  has presented today for surgery, with the diagnosis of Food impaction of esophagus, sequela [908.5] Dysphagia [787.20]  The various methods of treatment have been discussed with the patient and family. After consideration of risks, benefits and other options for treatment, the patient has consented to  Procedure(s): ESOPHAGOGASTRODUODENOSCOPY (EGD) Probable [place]. (N/A) as a surgical intervention .  The patient's history has been reviewed, patient examined, no change in status, stable for surgery.  I have reviewed the patient's chart and labs.  Questions were answered to the patient's satisfaction.     Lexington Devine M

## 2013-05-06 NOTE — ED Notes (Signed)
Pt states that she threw up the GI cocktail. Norlene Campbell, MD is aware.

## 2013-05-06 NOTE — ED Provider Notes (Signed)
CSN: [id number]     Arrival date & time 05/05/13  2311 History   First MD Initiated Contact with Patient 05/06/13 0243     Chief Complaint  Patient presents with  . Dysphagia   (Consider location/radiation/quality/duration/timing/severity/associated sxs/prior Treatment) HPI 72 year old female presents to emergency room with complaint of difficulty swallowing.  Patient has history of esophageal stricture, she seen at [facility], by Dr. Leone Payor.  Last dilation was done in January.  Patient reports on Sunday, she was eating pizza and felt like it scratched and got stuck.  She is complaining of left upper throat pain and left lateral neck pain with swallowing.  Patient reports that she is now only able to take sips of water, and occasionally she is bringing up that as well.  She is handling her secretions.  Patient was seen yesterday at an urgent care, and was told that her workup was fine.  Due to persistent symptoms, she presents to the emergency department.  Past Medical History  Diagnosis Date  . Depression     Controlled with SSRI  . HTN (hypertension)   . GERD (gastroesophageal reflux disease)     Chronic PPI's  . S/P dilatation of esophageal stricture     7/12 Dr Leone Payor. Dilation of tight stricture proximal eso.  Prior dilation 09 and 08.  . Seasonal allergies     Zyrtec D PRN  . NASH (nonalcoholic steatohepatitis)     Per [place] 10/10  . History of PSVT (paroxysmal supraventricular tachycardia)     Remote and self limited.  . Gout     No crystal dx. R podegra x 3 episodes around 2010. Did not take the allopurinol that was rec 2011.  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 Chronic venous insufficiency     Present for years. Norvasc D/C'd 2012 thinking it might be contributing.  . Migraines   . Glaucoma   . Neuropathy   . Back pain   . Muscle weakness    Past Surgical History  Procedure Laterality Date  .  Cholecystectomy    . Tubal ligation    . Laparoscopic burch procedure  04/02/07    Burch colposuspension. Inadvertant bladder damage - bladder repair, left ureteral stent placement, .  . Knee arthroscopy    . Abdominal hysterectomy  1980    2/2 menorrhagia  . Colonoscopy  09/28/2006    diverticulosis  . 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 dyspmotility of esophagus  . Bladder repair    . Eye surgery      cataracts, glaucoma  . Muscle biopsy Right 11/18/2012    Procedure: right vastus lateralis muscle biopsy ;  Surgeon: Currie Paris, MD;  Location: [facility] OR;  Service: General;  Laterality: Right;   Family History  Problem Relation Age of Onset  . Heart disease Mother   . Hypertension Mother   . Heart attack Mother   . Heart disease Father   . Hypertension Father   . Stroke Father   . Heart disease Sister   . Hypertension Sister   . Throat cancer Brother   . Hypertension Brother   . Heart disease Brother   . Colon cancer Neg Hx    History  Substance Use Topics  . Smoking status: Former Smoker    Types: Cigarettes    Quit date: 08/07/1968  . Smokeless tobacco:  Never Used  . Alcohol Use: No   OB History   Grav Para Term Preterm Abortions TAB SAB Ect Mult Living                 Review of Systems  All other systems reviewed and are negative.    Allergies  Propoxyphene napsylate; Darvon; and Latex  Home Medications   Current Outpatient Rx  Name  Route  Sig  Dispense  Refill  . albuterol (PROAIR HFA) 108 (90 BASE) MCG/ACT inhaler   Inhalation   Inhale 2 puffs into the lungs every 6 (six) hours as needed for shortness of breath.   1 Inhaler   1   . amitriptyline (ELAVIL) 50 MG tablet   Oral   Take 1 tablet (50 mg total) by mouth at bedtime.   90 tablet   3   . amLODipine (NORVASC) 5 MG tablet   Oral   Take 1 tablet (5 mg total) by mouth daily.   90  tablet   3   . Cetirizine HCl 10 MG CAPS   Oral   Take 1 capsule (10 mg total) by mouth daily.   90 capsule   0   . ciprofloxacin (CIPRO) 500 MG tablet   Oral   Take 1 tablet (500 mg total) by mouth 2 (two) times daily.   6 tablet   0   . cloNIDine (CATAPRES) 0.1 MG tablet   Oral   Take 1 tablet (0.1 mg total) by mouth 2 (two) times daily.   180 tablet   3   . esomeprazole (NEXIUM) 40 MG capsule   Oral   Take 1 capsule (40 mg total) by mouth daily before breakfast.   30 capsule   0     Samples given to patient    Lot# [id number]           ...   . FLUoxetine (PROZAC) 40 MG capsule   Oral   Take 1 capsule (40 mg total) by mouth daily.   30 capsule   12   . hydrochlorothiazide (HYDRODIURIL) 25 MG tablet   Oral   Take 1 tablet (25 mg total) by mouth daily.   90 tablet   3   . HYDROcodone-acetaminophen (NORCO/VICODIN) 5-325 MG per tablet   Oral   Take 1 tablet by mouth every 6 (six) hours as needed for pain.   20 tablet   0   . hydrocortisone cream 1 %      Apply to affected area 2 times daily   30 g   1   . metoprolol (LOPRESSOR) 100 MG tablet   Oral   Take 1 tablet (100 mg total) by mouth 2 (two) times daily.   60 tablet   3   . Multiple Vitamin (MULTIVITAMIN) capsule   Oral   Take 1 capsule by mouth daily.   30 capsule   1   . spironolactone (ALDACTONE) 50 MG tablet   Oral   Take 0.5 tablets (25 mg total) by mouth daily.   90 tablet   3   . timolol (TIMOPTIC) 0.25 % ophthalmic solution   Left Eye   Place 1 drop into the left eye 2 (two) times daily.           BP 159/77  Pulse 91  Temp(Src) 98.1 F (36.7 C) (Oral)  Resp 19  SpO2 98%  LMP 11/08/1978 Physical Exam  Nursing note and vitals reviewed. Constitutional: She is oriented to person, place,  and time. She appears well-developed and well-nourished.  HENT:  Head: Normocephalic and atraumatic.  Right Ear: External ear normal.  Left Ear: External ear normal.  Nose: Nose normal.   Mouth/Throat: Oropharynx is clear and moist.  Eyes: Conjunctivae and EOM are normal. Pupils are equal, round, and reactive to light.  Neck: Normal range of motion. Neck supple. No JVD present. No tracheal deviation present. No thyromegaly present.  Cardiovascular: Normal rate, regular rhythm, normal heart sounds and intact distal pulses.  Exam reveals no gallop and no friction rub.   No murmur heard. Pulmonary/Chest: Effort normal and breath sounds normal. No stridor. No respiratory distress. She has no wheezes. She has no rales. She exhibits no tenderness.  Abdominal: Soft. Bowel sounds are normal. She exhibits no distension and no mass. There is no tenderness. There is no rebound and no guarding.  Musculoskeletal: Normal range of motion. She exhibits no edema and no tenderness.  Lymphadenopathy:    She has no cervical adenopathy.  Neurological: She is alert and oriented to person, place, and time. She exhibits normal muscle tone. Coordination normal.  Skin: Skin is warm and dry. No rash noted. No erythema. No pallor.  Psychiatric: She has a normal mood and affect. Her behavior is normal. Judgment and thought content normal.    ED Course  Procedures (including critical care time) Labs Review Labs Reviewed - No data to display Imaging Review No results found.  MDM   1. Dysphagia   2. Esophageal stenosis    72 year old female with a history of esophageal stenosis, who presents with dysphasia.  She reports feeling better after GI cocktail, has not had any vomiting here.  Discuss with Dr.  With GI, who recommends patient call after a.m. and be seen in the office.    Olivia Mackie, MD 05/06/13 [phone]

## 2013-05-07 ENCOUNTER — Encounter (HOSPITAL_COMMUNITY): Payer: Self-pay | Admitting: Internal Medicine

## 2013-05-07 ENCOUNTER — Telehealth: Payer: Self-pay

## 2013-05-07 NOTE — Telephone Encounter (Signed)
Message copied by Annett Fabian on Wed May 07, 2013 10:47 AM ------      Message from: Mike Gip S      Created: Wed May 07, 2013 10:33 AM       This pt had emergent EGD yesterday with Pyrtle, and needs an office visit with Leone Payor soon to decide on further workup ------

## 2013-05-07 NOTE — Telephone Encounter (Signed)
Patient is scheduled for tomorrow at 10:45

## 2013-05-08 ENCOUNTER — Encounter: Payer: Self-pay | Admitting: Internal Medicine

## 2013-05-08 ENCOUNTER — Ambulatory Visit (INDEPENDENT_AMBULATORY_CARE_PROVIDER_SITE_OTHER): Payer: Medicare PPO | Admitting: Internal Medicine

## 2013-05-08 VITALS — BP 104/60 | HR 96 | Ht 69.0 in | Wt 189.1 lb

## 2013-05-08 DIAGNOSIS — R1313 Dysphagia, pharyngeal phase: Secondary | ICD-10-CM

## 2013-05-08 NOTE — Patient Instructions (Addendum)
Today we are providing you with a dysphagia handout, try level one.  You have been scheduled for a modified barium swallow on 05/16/13 at 1:00pm. Please arrive 15 minutes prior to your test for registration. You will go to [facility] (1st Floor) for your appointment.  Should you need to cancel or reschedule your appointment, please contact [phone] [facility] [place]) or [phone] [facility]). _____________________________________________________________________ A Modified Barium Swallow Study, or MBS, is a special x-ray that is taken to check swallowing skills. It is carried out by a Marine scientist and a Warehouse manager (SLP). During this test, yourmouth, throat, and esophagus, a muscular tube which connects your mouth to your stomach, is checked. The test will help you, your doctor, and the SLP plan what types of foods and liquids are easier for you to swallow. The SLP will also identify positions and ways to help you swallow more easily and safely. What will happen during an MBS? You will be taken to an x-ray room and seated comfortably. You will be asked to swallow small amounts of food and liquid mixed with barium. Barium is a liquid or paste that allows images of your mouth, throat and esophagus to be seen on x-ray. The x-ray captures moving images of the food you are swallowing as it travels from your mouth through your throat and into your esophagus. This test helps identify whether food or liquid is entering your lungs (aspiration). The test also shows which part of your mouth or throat lacks strength or coordination to move the food or liquid in the right direction. This test typically takes 30 minutes to 1 hour to complete. _______________________________________________________________________  Follow up with [place] in a month.  I appreciate the opportunity to care for you.

## 2013-05-08 NOTE — Progress Notes (Signed)
  Subjective:    Patient ID: Heather [name], female    DOB: 1941-03-22, 72 y.o.   MRN: [id number]  HPI Patient is here for followup. 2 days ago she had an urgent endoscopy with removal of a small piece of food from the upper esophagus sphincter area. She pizza the day before and was struggling with dysphagia and food impaction-like symptoms since that time. She has a long history of dysphagia, involving the cricopharyngeal area. She has previously responded to [place] dilation of that area but in January of 2014 I did again but has not helped. She is only able get liquids and soft things like yogurt down right now.  Medications, allergies, past medical history, past surgical history, family history and social history are reviewed and updated in the EMR.   Review of Systems She has polymyositis and walks with a walker. She says her right leg is getting worse.    Objective:   Physical Exam Well-developed elderly black woman looking younger than stated age  I reviewed the 05/06/2013 upper endoscopy report and previous barium swallow. She says she's had speech pathology evaluation in the past I cannot see a recent one    Assessment & Plan:   1. Pharyngeal dysphagia   2. Cricopharyngeal dysphagia/achalasia    1. We'll schedule modified barium swallow for further evaluation of this and we couldn't after that. Go ahead and put in for a return to clinic in one month regardless. 2. Trial of pureed diet and force liquids.  NW:[id number], Clydie Braun, MD

## 2013-05-09 ENCOUNTER — Other Ambulatory Visit (HOSPITAL_COMMUNITY): Payer: Self-pay | Admitting: Internal Medicine

## 2013-05-09 DIAGNOSIS — R1310 Dysphagia, unspecified: Secondary | ICD-10-CM

## 2013-05-16 ENCOUNTER — Other Ambulatory Visit (HOSPITAL_COMMUNITY): Payer: Medicare PPO

## 2013-05-16 ENCOUNTER — Ambulatory Visit (HOSPITAL_COMMUNITY)
Admission: RE | Admit: 2013-05-16 | Discharge: 2013-05-16 | Disposition: A | Discharge status: Home / Self Care | Payer: Medicare PPO | Source: Ambulatory Visit | Attending: Internal Medicine | Admitting: Internal Medicine

## 2013-05-16 DIAGNOSIS — R1310 Dysphagia, unspecified: Secondary | ICD-10-CM

## 2013-05-16 DIAGNOSIS — R1313 Dysphagia, pharyngeal phase: Secondary | ICD-10-CM | POA: Insufficient documentation

## 2013-05-16 NOTE — Procedures (Signed)
Objective Swallowing Evaluation: Bedside swallow evaluation  Patient Details  Name: BRIGITTE SODERBERG MRN: [id number] Date of Birth: 1941/06/01  Today's Date: 05/16/2013 Time: 1325-1415 SLP Time Calculation (min): 50 min  Past Medical History:  Past Medical History  Diagnosis Date  . Depression     Controlled with SSRI  . HTN (hypertension)   . GERD (gastroesophageal reflux disease)     Chronic PPI's  . S/P dilatation of esophageal stricture     7/12 Dr Leone Payor. Dilation of tight stricture proximal eso.  Prior dilation 09 and 08.  . Seasonal allergies     Zyrtec D PRN  . NASH (nonalcoholic steatohepatitis)     Per [place] 10/10  . History of PSVT (paroxysmal supraventricular tachycardia)     Remote and self limited.  . Gout     No crystal dx. R podegra x 3 episodes around 2010. Did not take the allopurinol that was rec 2011.  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 Chronic venous insufficiency     Present for years. Norvasc D/C'd 2012 thinking it might be contributing.  . Migraines   . Glaucoma   . Neuropathy   . Back pain   . Muscle weakness   . Polymyositis    Past Surgical History:  Past Surgical History  Procedure Laterality Date  . Cholecystectomy    . Tubal ligation    . Laparoscopic burch procedure  04/02/07    Burch colposuspension. Inadvertant bladder damage - bladder repair, left ureteral stent placement, .  . Knee arthroscopy    . Abdominal hysterectomy  1980    2/2 menorrhagia  . Colonoscopy  09/28/2006    diverticulosis  . 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 dyspmotility of esophagus  . Bladder repair    . Eye surgery      cataracts, glaucoma  . Muscle biopsy Right 11/18/2012    Procedure: right vastus lateralis muscle biopsy ;  Surgeon: Currie Paris, MD;  Location: [facility] OR;  Service:  General;  Laterality: Right;  . Esophagogastroduodenoscopy N/A 05/06/2013    Procedure: ESOPHAGOGASTRODUODENOSCOPY (EGD) Probable Elease Hashimoto.;  Surgeon: Beverley Fiedler, MD;  Location: [facility];  Service: Gastroenterology;  Laterality: N/A;   HPI:  82 you female referred by Dr Leone Payor for MBS study due to pt's cricopharyngeus achalasia and recent episode of pt experiencing severe "choking" episode on pizza requiring endoscopy.  A small piece of food was lodged at UES.  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   Pt admits to some weight loss but did not indicate amount, pna approx 3 years ago.  She admits to requiring heimlich manuever x3, last time approx six months ago.  Pt reports consumption of water with food  to wash down food.  Dysphagia symptoms listed included changes in speech/voice, coughing during meals, throat clearing during meals, regurgitation, sensation of food and pills lodging in throat, and coughing up mucus.   Pt has been mostly been consuming liquid diet since Sept  30th endoscopy.       Assessment / Plan / Recommendation Clinical Impression  Dysphagia Diagnosis: Severe cervical esophageal phase dysphagia;Severe pharyngeal phase dysphagia Clinical impression: Pt presents with severe pharyngo-cervical esophageal phase dysphagia with sensorimotor impairments due her myositis.  Pt exhibits nearly absent tongue base retraction and poor pharyngeal contraction resulting in  gross pharyngeal stasis.  In addition, pt demonstrated pooling of pudding at tongue base/valleculae requiring liquid to facilitate a pharyngeal swallow (although pt had sensed she had swallowed some of the pudding).  Extended breath hold noted with adequate laryngeal elevation/closure noted to allow airway protection.  Cracker precariously was retained in pharynx requiring much liquids to clear.  Extended laryngeal elevation/breath hold taxes the respiratory  system and ability to conduct adequately may be compromised as disease progresses.   Pt does sense signifcant pharyngeal stasis but not mild amounts.   She swallows several times with each bolus (up to 5-6) which is effective,  although laborious.     Pt did "hock" and expectorate -per SLP instructions- removing 25% of moderate vallecular stasis with pudding that she was unable to "wash down".  Various postures including head turn right/left were not effective to decrease stasis.  Liquids are easier for pt to clear through pharynx.  Advised pt to [facility] soft/moist/water based foods to "mush" and use liquids to help clear.   Consumption of several small meals recommended due to extensive strategies required to mitigate aspiration risk and maximize nutrition.  SLP provided pt with multiple high nutrition liquid recipes and provided education for xerostomia compensation.  Skilled intervention included verbal/visual feedback and reinforcement of compensation strategies as well as education of pt and son to findings recommendations.    Defer to referring MD for pharyngeal exercise recommendation due to concern for muscle fatigue with pt's disease process possibly worsening dysphagia.   Thanks for this referral.      Treatment Recommendation    defer to referring MD   Diet Recommendation Thin liquid;Nectar-thick liquid;Dysphagia 3 (Mechanical Soft) (soft moist foods that can be masticated to "mush")   Liquid Administration via: Cup Medication Administration:  (crush, chewable or liquid form with yogurt or liquids) Supervision: Patient able to self feed Compensations: Slow rate;Small sips/bites;Multiple dry swallows after each bite/sip (start and follow all foods with water, eat several small meals) Postural Changes and/or Swallow Maneuvers: Seated upright 90 degrees;Upright 30-60 min after meal ("hock" to clear pharyngeal stasis at end of meal/snack)    Other  Recommendations Recommended Consults:   (? if esophageal manometry may be beneficial to consider) Oral Care Recommendations: Oral care BID   Follow Up Recommendations  None           SLP Swallow Goals     General Date of Onset: 05/16/13 HPI: 51 you female referred by Dr Leone Payor for MBS study due to pt's cricopharyngeus achalasia and recent episode of pt experiencing severe "choking" episode on pizza requiring endoscopy.  A small piece of food was lodged at UES.  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   Pt admits to some weight loss but did not indicate amount, pna approx 3 years ago.  She admits to requiring heimlich manuever x3, last time approx six months ago.  Pt reports consumption of water with food  to wash down food.  Dysphagia symptoms listed included changes in speech/voice, coughing during meals, throat clearing during meals, regurgitation, sensation of food and pills lodging in throat, and coughing up mucus.   Pt has been mostly been consuming liquid diet since Sept  30th endoscopy.   Type of Study: Bedside swallow evaluation Reason for Referral: Objectively evaluate swallowing function Previous Swallow Assessment: h/o CP Bar, dysmotility, esophageal stenosis Diet Prior to this Study: Thin liquids Temperature Spikes Noted: No Respiratory Status: Room air History of  Recent Intubation: No Behavior/Cognition: Alert;Cooperative;Pleasant mood Oral Cavity - Dentition: Adequate natural dentition Oral Motor / Sensory Function: Within functional limits Self-Feeding Abilities: Able to feed self Patient Positioning: Upright in chair Baseline Vocal Quality: Clear Volitional Cough: Strong Volitional Swallow:  (attempts, very poor muscular contraction) Anatomy: Other (Comment) (Appearance of significant CP Bar) Pharyngeal Secretions: Not observed secondary MBS    Reason for Referral Objectively evaluate swallowing function   Oral Phase Oral Preparation/Oral  Phase Oral Phase: Impaired Oral - Nectar Oral - Nectar Cup: Piecemeal swallowing;Reduced posterior propulsion Oral - Thin Oral - Thin Cup: Reduced posterior propulsion;Piecemeal swallowing Oral - Solids Oral - Puree: Reduced posterior propulsion;Delayed oral transit;Piecemeal swallowing Oral - Regular: Delayed oral transit;Reduced posterior propulsion;Piecemeal swallowing;Impaired mastication Oral Phase - Comment Oral Phase - Comment: Suspect majority of piecemeal deglutition is compensation.  Pt used thin liquids to aid oral transit with solids and to trigger pharyngeal swallow.     Pharyngeal Phase Pharyngeal Phase Pharyngeal Phase: Impaired Pharyngeal - Nectar Pharyngeal - Nectar Cup: Pharyngeal residue - valleculae;Pharyngeal residue - pyriform sinuses;Pharyngeal residue - cp segment;Reduced pharyngeal peristalsis;Reduced tongue base retraction Pharyngeal - Thin Pharyngeal - Thin Cup: Reduced tongue base retraction;Reduced pharyngeal peristalsis;Pharyngeal residue - valleculae;Pharyngeal residue - pyriform sinuses;Pharyngeal residue - cp segment Pharyngeal - Solids Pharyngeal - Puree: Reduced pharyngeal peristalsis;Reduced tongue base retraction;Pharyngeal residue - cp segment;Pharyngeal residue - valleculae;Pharyngeal residue - pyriform sinuses;Delayed swallow initiation;Premature spillage to valleculae Pharyngeal - Regular: Delayed swallow initiation;Reduced pharyngeal peristalsis;Reduced tongue base retraction;Pharyngeal residue - valleculae;Pharyngeal residue - pyriform sinuses;Pharyngeal residue - cp segment;Premature spillage to valleculae Pharyngeal Phase - Comment Pharyngeal Comment: pt complained of sensation of stasis on left - in A-P view, liquid stasis worse on right, head turn postures left and right did not aid UES opening, pt conducts extended breath hold for maximal airway protection  Cervical Esophageal Phase    GO    Cervical Esophageal Phase Cervical Esophageal  Phase: Impaired Cervical Esophageal Phase - Nectar Nectar Cup: Prominent cricopharyngeal segment;Reduced cricopharyngeal relaxation Cervical Esophageal Phase - Thin Thin Cup: Prominent cricopharyngeal segment;Reduced cricopharyngeal relaxation Cervical Esophageal Phase - Solids Puree: Prominent cricopharyngeal segment;Reduced cricopharyngeal relaxation Regular: Prominent cricopharyngeal segment;Reduced cricopharyngeal relaxation Cervical Esophageal Phase - Comment Cervical Esophageal Comment: Very poor UES opening allowing trapping of food more than liquids in pharynx, head turn postures not helpful, following solids with liquids helpful, appearance of "backflow" of liquid to proximal thoracic region x1 WITHOUT pt sensation, radiologist not present to confirm (pt has known h/o dysmotiity)      Functional Assessment Tool Used: MBS Functional Limitations: Swallowing Swallow Current Status (Z6109): At least 60 percent but less than 80 percent impaired, limited or restricted Swallow Goal Status [phone]): At least 60 percent but less than 80 percent impaired, limited or restricted Swallow Discharge Status [phone]): At least 60 percent but less than 80 percent impaired, limited or restricted    Mills Koller, MS [facility] SLP [phone]

## 2013-05-19 ENCOUNTER — Encounter: Payer: Self-pay | Admitting: Internal Medicine

## 2013-05-19 ENCOUNTER — Telehealth: Payer: Self-pay | Admitting: Neurology

## 2013-05-19 ENCOUNTER — Other Ambulatory Visit: Payer: Self-pay

## 2013-05-19 DIAGNOSIS — R1314 Dysphagia, pharyngoesophageal phase: Secondary | ICD-10-CM

## 2013-05-19 DIAGNOSIS — R1319 Other dysphagia: Secondary | ICD-10-CM

## 2013-05-19 HISTORY — DX: Dysphagia, pharyngoesophageal phase: R13.14

## 2013-05-19 NOTE — Progress Notes (Signed)
Quick Note:  Please let her know I am consulting with neurology about starting speech tx ______

## 2013-05-19 NOTE — Telephone Encounter (Signed)
Please give her a follow up appt in 6 months with Dr. Terrace Arabia

## 2013-05-22 ENCOUNTER — Ambulatory Visit: Payer: Medicare PPO | Attending: Internal Medicine | Admitting: Speech Pathology

## 2013-05-22 DIAGNOSIS — IMO0001 Reserved for inherently not codable concepts without codable children: Secondary | ICD-10-CM | POA: Insufficient documentation

## 2013-05-22 DIAGNOSIS — R1310 Dysphagia, unspecified: Secondary | ICD-10-CM | POA: Insufficient documentation

## 2013-05-27 ENCOUNTER — Encounter: Payer: Self-pay | Admitting: Internal Medicine

## 2013-05-27 NOTE — Progress Notes (Signed)
Patient ID: Heather [name], female   DOB: [birth year], 72 y.o.   MRN: [id number] Faxed Dr. Leone Payor signed treatment plans to [facility]. Sent to be scanned in.

## 2013-05-28 NOTE — Telephone Encounter (Signed)
I have called patient twice to schedule follow up still no answer.

## 2013-06-03 NOTE — Telephone Encounter (Signed)
Has been seeing [facility].

## 2013-06-16 ENCOUNTER — Other Ambulatory Visit: Payer: Self-pay | Admitting: Internal Medicine

## 2013-06-16 DIAGNOSIS — M1710 Unilateral primary osteoarthritis, unspecified knee: Secondary | ICD-10-CM

## 2013-06-16 DIAGNOSIS — Z8781 Personal history of (healed) traumatic fracture: Secondary | ICD-10-CM

## 2013-06-16 DIAGNOSIS — Z0000 Encounter for general adult medical examination without abnormal findings: Secondary | ICD-10-CM

## 2013-06-16 NOTE — Progress Notes (Signed)
Pt aware and will call to sch appt.

## 2013-06-23 ENCOUNTER — Encounter: Payer: Medicare PPO | Admitting: Internal Medicine

## 2013-06-25 ENCOUNTER — Other Ambulatory Visit: Payer: Self-pay | Admitting: Internal Medicine

## 2013-07-14 ENCOUNTER — Encounter: Payer: Self-pay | Admitting: Internal Medicine

## 2013-07-14 ENCOUNTER — Ambulatory Visit (INDEPENDENT_AMBULATORY_CARE_PROVIDER_SITE_OTHER): Payer: Medicare PPO | Admitting: Internal Medicine

## 2013-07-14 VITALS — BP 144/80 | HR 101 | Temp 97.3°F | Ht 68.25 in | Wt 193.3 lb

## 2013-07-14 DIAGNOSIS — I10 Essential (primary) hypertension: Secondary | ICD-10-CM

## 2013-07-14 DIAGNOSIS — K219 Gastro-esophageal reflux disease without esophagitis: Secondary | ICD-10-CM

## 2013-07-14 DIAGNOSIS — L309 Dermatitis, unspecified: Secondary | ICD-10-CM

## 2013-07-14 DIAGNOSIS — R1314 Dysphagia, pharyngoesophageal phase: Secondary | ICD-10-CM

## 2013-07-14 DIAGNOSIS — L259 Unspecified contact dermatitis, unspecified cause: Secondary | ICD-10-CM

## 2013-07-14 MED ORDER — ESOMEPRAZOLE MAGNESIUM 40 MG PO CPDR: 40.0000 mg | DELAYED_RELEASE_CAPSULE | Freq: Every day | ORAL | Status: DC

## 2013-07-14 NOTE — Progress Notes (Signed)
   Subjective:    Patient ID: Heather [name], female    DOB: 1941-01-22, 72 y.o.   MRN: [id number]  HPI Hx significant for hypertension, GERD with esophageal stenosis, dysphagia, and inclusion body myositis. Presents today for refill of Nexium, requesting submission of paperwork about her disability to be sent to her insurance company and referal to dermatologist for rash on her thighs. She is present with her boyfriend who has several complaints concerning insurance matters and disability approval.   Review of Systems  Constitutional: Negative for fever and chills.  HENT: Negative.   Eyes: Negative.   Respiratory: Negative.   Cardiovascular: Negative.   Gastrointestinal:       Dysphagia  Endocrine: Negative.   Musculoskeletal: Positive for arthralgias, gait problem and myalgias.  Skin: Positive for rash.  Allergic/Immunologic: Positive for environmental allergies.  Neurological: Negative.   Hematological: Negative.   Psychiatric/Behavioral: Negative.        Objective:   Physical Exam  Constitutional: She is oriented to person, place, and time. She appears well-developed and well-nourished. No distress.  HENT:  Head: Normocephalic and atraumatic.  Eyes: Conjunctivae are normal. Pupils are equal, round, and reactive to light.  Neck: Neck supple.  Cardiovascular: Normal rate, regular rhythm, normal heart sounds and intact distal pulses.   No murmur heard. Pulmonary/Chest: Effort normal and breath sounds normal.  Musculoskeletal: She exhibits edema. She exhibits no tenderness.  Neurological: She is alert and oriented to person, place, and time.  Skin: Skin is warm and dry.     Psychiatric: She has a normal mood and affect. Her behavior is normal. Judgment and thought content normal.          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

## 2013-07-14 NOTE — Patient Instructions (Signed)
General Instructions:  We have refilled your Nexium. We will fill out [employer] paper work and fax to the [employer] of [place]. We will refer you to Dermatology for your thigh rash. We will schedule you for a bone scan.  Treatment Goals:  Goals (1 Years of Data) as of 07/14/13         As of Today 05/08/13 05/06/13 05/06/13 05/06/13     Blood Pressure    . Blood Pressure < 140/90  144/80 104/60 147/74 145/77 149/78     Lifestyle    . Prevent Falls            Progress Toward Treatment Goals:  Treatment Goal 07/14/2013  Blood pressure at goal  Prevent falls -    Self Care Goals & Plans:  Self Care Goal 07/14/2013  Manage my medications take my medicines as prescribed; bring my medications to every visit; refill my medications on time; follow the sick day instructions if I am sick  Monitor my health keep track of my blood pressure  Eat healthy foods eat more vegetables; eat fruit for snacks and desserts; eat foods that are low in salt; eat smaller portions; eat baked foods instead of fried foods  Be physically active find an activity I enjoy    No flowsheet data found.   Care Management & Community Referrals:  Referral 03/10/2013  Referrals made to community resources falls prevention

## 2013-07-14 NOTE — Assessment & Plan Note (Addendum)
BP Readings from Last 3 Encounters:  07/14/13 144/80  05/08/13 104/60  05/06/13 147/74    Lab Results  Component Value Date   NA 138 11/18/2012   K 3.9 11/18/2012   CREATININE 0.53 11/18/2012    Assessment: Blood pressure control: controlled Progress toward BP goal:  at goal Comments:   Plan: Medications:  continue current medications Educational resources provided: brochure;handout;video Self management tools provided:   Other plans: cont amlodipine, clonidine, HCTZ, and spironolactone

## 2013-07-16 ENCOUNTER — Ambulatory Visit (HOSPITAL_COMMUNITY)
Admission: RE | Admit: 2013-07-16 | Discharge: 2013-07-16 | Disposition: A | Discharge status: Home / Self Care | Payer: Medicare PPO | Source: Ambulatory Visit | Attending: Internal Medicine | Admitting: Internal Medicine

## 2013-07-16 ENCOUNTER — Telehealth: Payer: Self-pay | Admitting: *Deleted

## 2013-07-16 DIAGNOSIS — Z8781 Personal history of (healed) traumatic fracture: Secondary | ICD-10-CM

## 2013-07-16 DIAGNOSIS — Z780 Asymptomatic menopausal state: Secondary | ICD-10-CM | POA: Insufficient documentation

## 2013-07-16 DIAGNOSIS — Z13820 Encounter for screening for osteoporosis: Secondary | ICD-10-CM | POA: Insufficient documentation

## 2013-07-16 DIAGNOSIS — Z0000 Encounter for general adult medical examination without abnormal findings: Secondary | ICD-10-CM

## 2013-07-16 NOTE — Telephone Encounter (Signed)
Pt called asking for Rx for Tamiflu to be sent to her pharmacy. I returned call to get more information and schedule an appointment but Pt not at home and no answer machine.

## 2013-07-17 NOTE — Assessment & Plan Note (Signed)
Resolved after hydrocortisone therapy, pt states area fieels itchy at times, several hyperpigment splotches on bilateral inner thigh.

## 2013-07-17 NOTE — Assessment & Plan Note (Signed)
Refilled nexium, followed by Dr. Leone Payor

## 2013-07-17 NOTE — Progress Notes (Signed)
Case discussed with Dr. Schooler at time of visit.  We reviewed the resident's history and exam and pertinent patient test results.  I agree with the assessment, diagnosis, and plan of care documented in the resident's note. 

## 2013-10-16 NOTE — Telephone Encounter (Signed)
Pt came to her apt closing encounter

## 2013-10-20 ENCOUNTER — Ambulatory Visit (INDEPENDENT_AMBULATORY_CARE_PROVIDER_SITE_OTHER): Payer: Medicare PPO | Admitting: Internal Medicine

## 2013-10-20 ENCOUNTER — Encounter: Payer: Self-pay | Admitting: Internal Medicine

## 2013-10-20 VITALS — BP 116/72 | HR 77 | Temp 98.2°F | Ht 70.0 in | Wt 194.5 lb

## 2013-10-20 DIAGNOSIS — K222 Esophageal obstruction: Secondary | ICD-10-CM

## 2013-10-20 DIAGNOSIS — N390 Urinary tract infection, site not specified: Secondary | ICD-10-CM

## 2013-10-20 DIAGNOSIS — R058 Other specified cough: Secondary | ICD-10-CM | POA: Insufficient documentation

## 2013-10-20 DIAGNOSIS — R05 Cough: Secondary | ICD-10-CM

## 2013-10-20 DIAGNOSIS — R1314 Dysphagia, pharyngoesophageal phase: Secondary | ICD-10-CM

## 2013-10-20 DIAGNOSIS — K219 Gastro-esophageal reflux disease without esophagitis: Secondary | ICD-10-CM

## 2013-10-20 DIAGNOSIS — J301 Allergic rhinitis due to pollen: Secondary | ICD-10-CM

## 2013-10-20 LAB — POCT URINALYSIS DIPSTICK
BILIRUBIN UA: NEGATIVE
Glucose, UA: NEGATIVE
Ketones, UA: NEGATIVE
Nitrite, UA: POSITIVE
Protein, UA: NEGATIVE
Spec Grav, UA: 1.01
Urobilinogen, UA: 1
pH, UA: 6.5

## 2013-10-20 MED ORDER — PANTOPRAZOLE SODIUM 20 MG PO TBEC: 20.0000 mg | DELAYED_RELEASE_TABLET | Freq: Every day | ORAL | Status: DC

## 2013-10-20 MED ORDER — CHLORPHENIRAMINE MALEATE 4 MG PO TABS: 4.0000 mg | ORAL_TABLET | Freq: Two times a day (BID) | ORAL | Status: DC | PRN

## 2013-10-20 MED ORDER — CIPROFLOXACIN HCL 500 MG PO TABS: 500.0000 mg | ORAL_TABLET | Freq: Two times a day (BID) | ORAL | Status: DC

## 2013-10-20 MED ORDER — TRIAMCINOLONE ACETONIDE 55 MCG/ACT NA AERO: 1.0000 | INHALATION_SPRAY | Freq: Two times a day (BID) | NASAL | Status: DC

## 2013-10-20 NOTE — Progress Notes (Signed)
   Subjective:    Patient ID: Heather [name], female    DOB: 1941/03/07, 72 y.o.   MRN: [id number]  HPI  Presents with complaints of urinary "pressure" for the past week.  "Feels like a another bladder infection" States that last UTI was treated with cipro and would like to try that again.  Also reports increased runny nose and cough.  In need of GERD medicine refill but states that she can no longer get Nexium from the drug company and would like to try a smaller pill than omeprazole.  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  Review of Systems  Constitutional: Negative for fever and fatigue.  HENT: Positive for postnasal drip and rhinorrhea. Negative for sinus pressure.   Eyes: Negative.   Respiratory: Negative for shortness of breath.   Cardiovascular: Negative for chest pain and palpitations.  Gastrointestinal: Negative for nausea, vomiting, abdominal pain, diarrhea and constipation.  Genitourinary: Positive for dysuria. Negative for hematuria, flank pain, vaginal bleeding, vaginal discharge and difficulty urinating.       Suprapublic pressure when urinating  Musculoskeletal: Positive for back pain and gait problem.  Allergic/Immunologic:       Seasonal allergies  Neurological: Negative for headaches.  Hematological: Bruises/bleeds easily.  Psychiatric/Behavioral: Negative.        Objective:   Physical Exam  Constitutional: She is oriented to person, place, and time. She appears well-developed and well-nourished. No distress.  Using strolling walker  HENT:  Head: Normocephalic and atraumatic.  Right Ear: Hearing, tympanic membrane, external ear and ear canal normal. Tympanic membrane is not injected.  Left Ear: Hearing, tympanic membrane, external ear and ear canal normal. Tympanic membrane is not injected.  Nose: Mucosal edema and rhinorrhea present.  Mouth/Throat: Uvula is  midline and oropharynx is clear and moist.  Eyes: Conjunctivae and EOM are normal. Pupils are equal, round, and reactive to light.  Neck: Normal range of motion. Neck supple. No thyromegaly present.  Cardiovascular: Normal rate, regular rhythm and normal heart sounds.   Pulmonary/Chest: Effort normal and breath sounds normal.  Abdominal: Soft. Bowel sounds are normal. There is no tenderness.  obese  Neurological: She is alert and oriented to person, place, and time.  Skin: Skin is warm and dry.  Psychiatric: She has a normal mood and affect.          Assessment & Plan:  See seaparate problem-list charting:  Cipro for UTI, send cx Chlorpheniramine for rhinitis and Upper Airway Cough Sx Nasacort for rhinitis Protonix for ppi

## 2013-10-20 NOTE — Assessment & Plan Note (Signed)
Rx Protonix 20 mg qd

## 2013-10-20 NOTE — Patient Instructions (Signed)
We will send in an antibiotic for your urinary tract infection as well as a medicine to help with your runny nose and cough. As you requested, we have changed from the Nexium to another reflux pill. Since the omeprazole was too large to swallow, we will try pantoprozole. Be sure to follow-up with your gut doctor as scheduled.  Please bring your medicines with you each time you come.   Medicines may be:  Eye drops  Herbal   Vitamins  Pills  Seeing these help [place] take care of you.   Chlorpheniramine tablets What is this medicine? CHLORPHENIRAMINE (klor fen IR a meen) is an antihistamine. It is used to treat a runny nose from allergies or a cold. It is also used to treat the symptoms an allergic reaction. This medicine will not treat an infection. This medicine may be used for other purposes; ask your health care provider or pharmacist if you have questions. COMMON BRAND NAME(S): AHIST, Aller-Chlor , Allergy , Chlor-Pheniton, Chlor-Trimeton, Diabetic Tussin Allergy Relief, ED-Chlortan, Teldrin HBP What should I tell my health care provider before I take this medicine? They need to know if you have any of these conditions: -glaucoma -heart disease -high blood pressure -lung or breathing disease, like asthma -pain or difficulty passing urine -prostate trouble -ulcers or other stomach problems -an unusual or allergic reaction to chlorpheniramine, other medicines, foods, dyes, or preservatives -pregnant or trying to get pregnant -breast-feeding How should I use this medicine? Take this medicine by mouth with a full glass of water. Follow the directions on the prescription label. Take your doses at regular intervals. Do not take your medicine more often than directed. Talk to your pediatrician regarding the use of this medicine in children. While this drug may be prescribed for selected conditions, precautions do apply. Patients over 89 years old may have a stronger reaction and need a  smaller dose. Overdosage: If you think you have taken too much of this medicine contact a poison control center or emergency room at once. NOTE: This medicine is only for you. Do not share this medicine with others. What if I miss a dose? If you miss a dose, take it as soon as you can. If it is almost time for your next dose, take only that dose. Do not take double or extra doses. What may interact with this medicine? -alcohol -barbiturate medicines for sleep or treating seizures -MAOIs like Carbex, Eldepryl, Marplan, Nardil, and Parnate -medicines for allergies -medicines for depression, anxiety, or psychotic disturbances -medicines for sleep -some antibiotics This list may not describe all possible interactions. Give your health care provider a list of all the medicines, herbs, non-prescription drugs, or dietary supplements you use. Also tell them if you smoke, drink alcohol, or use illegal drugs. Some items may interact with your medicine. What should I watch for while using this medicine? Visit your doctor or health care professional for regular check ups. Tell your doctor if your symptoms do not improve or if they get worse. Your mouth may get dry. Chewing sugarless gum or sucking hard candy, and drinking plenty of water may help. Contact your doctor if the problem does not go away or is severe. This medicine may cause dry eyes and blurred vision. If you wear contact lenses you may feel some discomfort. Lubricating drops may help. See your eye doctor if the problem does not go away or is severe. You may get drowsy or dizzy. Do not drive, use machinery, or do anything that  needs mental alertness until you know how this medicine affects you. Do not stand or sit up quickly, especially if you are an older patient. This reduces the risk of dizzy or fainting spells. Alcohol may interfere with the effect of this medicine. Avoid alcoholic drinks. This medicine can make you more sensitive to the sun.  Keep out of the sun. If you cannot avoid being in the sun, wear protective clothing and use sunscreen. Do not use sun lamps or tanning beds/booths. What side effects may I notice from receiving this medicine? Side effects that you should report to your doctor or health care professional as soon as possible: -allergic reactions like skin rash, itching or hives, swelling of the face, lips, or tongue -breathing problems -changes in vision -confused, agitated, nervous -fast, irregular heartbeat -feeling faint, dizzy -seizures -tremor -trouble passing urine or change in the amount of urine -unusual sweating -unusually weak or tired Side effects that usually do not require medical attention (report to your doctor or health care professional if they continue or are bothersome): -constipation or diarrhea -drowsy -dry mouth, nose, throat -headache -loss of appetite -stomach upset, vomiting -trouble sleeping This list may not describe all possible side effects. Call your doctor for medical advice about side effects. You may report side effects to FDA at 1-800-FDA-1088. Where should I keep my medicine? Keep out of the reach of children. Store at room temperature between 15 and 30 degrees C (59 and 86 degrees F). Keep container tightly closed. Throw away any unused medicine after the expiration date. NOTE: This sheet is a summary. It may not cover all possible information. If you have questions about this medicine, talk to your doctor, pharmacist, or health care provider.  2014, Elsevier/Gold Standard. (2007-11-06 17:37:35)   Ciprofloxacin tablets What is this medicine? CIPROFLOXACIN (sip roe FLOX a sin) is a quinolone antibiotic. It is used to treat certain kinds of bacterial infections. It will not work for colds, flu, or other viral infections. This medicine may be used for other purposes; ask your health care provider or pharmacist if you have questions. COMMON BRAND NAME(S): Cipro What  should I tell my health care provider before I take this medicine? They need to know if you have any of these conditions: -bone problems -cerebral disease -joint problems -irregular heartbeat -kidney disease -liver disease -myasthenia gravis -seizure disorder -tendon problems -an unusual or allergic reaction to ciprofloxacin, other antibiotics or medicines, foods, dyes, or preservatives -pregnant or trying to get pregnant -breast-feeding How should I use this medicine? Take this medicine by mouth with a glass of water. Follow the directions on the prescription label. Take your medicine at regular intervals. Do not take your medicine more often than directed. Take all of your medicine as directed even if you think your are better. Do not skip doses or stop your medicine early. You can take this medicine with food or on an empty stomach. It can be taken with a meal that contains dairy or calcium, but do not take it alone with a dairy product, like milk or yogurt or calcium-fortified juice. A special MedGuide will be given to you by the pharmacist with each prescription and refill. Be sure to read this information carefully each time. Talk to your pediatrician regarding the use of this medicine in children. Special care may be needed. Overdosage: If you think you have taken too much of this medicine contact a poison control center or emergency room at once. NOTE: This medicine is only for  you. Do not share this medicine with others. What if I miss a dose? If you miss a dose, take it as soon as you can. If it is almost time for your next dose, take only that dose. Do not take double or extra doses. What may interact with this medicine? Do not take this medicine with any of the following medications: -cisapride -droperidol -terfenadine -tizanidine This medicine may also interact with the following medications: -antacids -caffeine -cyclosporin -didanosine (ddI) buffered tablets or  powder -medicines for diabetes -medicines for inflammation like ibuprofen, naproxen -methotrexate -multivitamins -omeprazole -phenytoin -probenecid -sucralfate -theophylline -warfarin This list may not describe all possible interactions. Give your health care provider a list of all the medicines, herbs, non-prescription drugs, or dietary supplements you use. Also tell them if you smoke, drink alcohol, or use illegal drugs. Some items may interact with your medicine. What should I watch for while using this medicine? Tell your doctor or health care professional if your symptoms do not improve. Do not treat diarrhea with over the counter products. Contact your doctor if you have diarrhea that lasts more than 2 days or if it is severe and watery. You may get drowsy or dizzy. Do not drive, use machinery, or do anything that needs mental alertness until you know how this medicine affects you. Do not stand or sit up quickly, especially if you are an older patient. This reduces the risk of dizzy or fainting spells. This medicine can make you more sensitive to the sun. Keep out of the sun. If you cannot avoid being in the sun, wear protective clothing and use sunscreen. Do not use sun lamps or tanning beds/booths. Avoid antacids, aluminum, calcium, iron, magnesium, and zinc products for 6 hours before and 2 hours after taking a dose of this medicine. What side effects may I notice from receiving this medicine? Side effects that you should report to your doctor or health care professional as soon as possible: - allergic reactions like skin rash, itching or hives, swelling of the face, lips, or tongue - breathing problems - confusion, nightmares or hallucinations - feeling faint or lightheaded, falls - irregular heartbeat - joint, muscle or tendon pain or swelling - pain or trouble passing urine -persistent headache with or without blurred vision - redness, blistering, peeling or loosening  of the skin, including inside the mouth - seizure - unusual pain, numbness, tingling, or weakness Side effects that usually do not require medical attention (report to your doctor or health care professional if they continue or are bothersome): - diarrhea - nausea or stomach upset - white patches or sores in the mouth This list may not describe all possible side effects. Call your doctor for medical advice about side effects. You may report side effects to FDA at 1-800-FDA-1088. Where should I keep my medicine? Keep out of the reach of children. Store at room temperature below 30 degrees C (86 degrees F). Keep container tightly closed. Throw away any unused medicine after the expiration date. NOTE: This sheet is a summary. It may not cover all possible information. If you have questions about this medicine, talk to your doctor, pharmacist, or health care provider.  2014, Elsevier/Gold Standard. (2011-08-11 12:53:06)  Pantoprazole tablets What is this medicine? PANTOPRAZOLE (pan TOE pra zole) prevents the production of acid in the stomach. It is used to treat gastroesophageal reflux disease (GERD), inflammation of the esophagus, and Zollinger-Ellison syndrome. This medicine may be used for other purposes; ask your health care provider or  pharmacist if you have questions. COMMON BRAND NAME(S): Protonix What should I tell my health care provider before I take this medicine? They need to know if you have any of these conditions: -liver disease -low levels of magnesium in the blood -an unusual or allergic reaction to omeprazole, lansoprazole, pantoprazole, rabeprazole, other medicines, foods, dyes, or preservatives -pregnant or trying to get pregnant -breast-feeding How should I use this medicine? Take this medicine by mouth. Swallow the tablets whole with a drink of water. Follow the directions on the prescription label. Do not crush, break, or chew. Take your medicine at regular  intervals. Do not take your medicine more often than directed. Talk to your pediatrician regarding the use of this medicine in children. While this drug may be prescribed for children as young as 5 years for selected conditions, precautions do apply. Overdosage: If you think you have taken too much of this medicine contact a poison control center or emergency room at once. NOTE: This medicine is only for you. Do not share this medicine with others. What if I miss a dose? If you miss a dose, take it as soon as you can. If it is almost time for your next dose, take only that dose. Do not take double or extra doses. What may interact with this medicine? Do not take this medicine with any of the following medications: -atazanavir -nelfinavir This medicine may also interact with the following medications: -ampicillin -delavirdine -digoxin -diuretics -iron salts -medicines for fungal infections like ketoconazole, itraconazole and voriconazole -warfarin This list may not describe all possible interactions. Give your health care provider a list of all the medicines, herbs, non-prescription drugs, or dietary supplements you use. Also tell them if you smoke, drink alcohol, or use illegal drugs. Some items may interact with your medicine. What should I watch for while using this medicine? It can take several days before your stomach pain gets better. Check with your doctor or health care professional if your condition does not start to get better, or if it gets worse. You may need blood work done while you are taking this medicine. What side effects may I notice from receiving this medicine? Side effects that you should report to your doctor or health care professional as soon as possible: -allergic reactions like skin rash, itching or hives, swelling of the face, lips, or tongue -bone, muscle or joint pain -breathing problems -chest pain or chest tightness -dark yellow or brown  urine -dizziness -fast, irregular heartbeat -feeling faint or lightheaded -fever or sore throat -muscle spasm -palpitations -redness, blistering, peeling or loosening of the skin, including inside the mouth -seizures -tremors -unusual bleeding or bruising -unusually weak or tired -yellowing of the eyes or skin Side effects that usually do not require medical attention (Report these to your doctor or health care professional if they continue or are bothersome.): -constipation -diarrhea -dry mouth -headache -nausea This list may not describe all possible side effects. Call your doctor for medical advice about side effects. You may report side effects to FDA at 1-800-FDA-1088. Where should I keep my medicine? Keep out of the reach of children. Store at room temperature between 15 and 30 degrees C (59 and 86 degrees F). Protect from light and moisture. Throw away any unused medicine after the expiration date. NOTE: This sheet is a summary. It may not cover all possible information. If you have questions about this medicine, talk to your doctor, pharmacist, or health care provider.  2014, Elsevier/Gold Standard. (2012-05-22 16:40:16)

## 2013-10-20 NOTE — Assessment & Plan Note (Signed)
Positive nitr and large leuk on dipstick urine -send for culture

## 2013-10-20 NOTE — Assessment & Plan Note (Signed)
Chlorpheniramine and Nasacort AQ

## 2013-10-21 ENCOUNTER — Telehealth: Payer: Self-pay | Admitting: *Deleted

## 2013-10-21 LAB — URINALYSIS, MICROSCOPIC ONLY
CASTS: NONE SEEN
Crystals: NONE SEEN
Squamous Epithelial / LPF: NONE SEEN
WBC, UA: >50 WBC/hpf — AB (ref <3)

## 2013-10-21 LAB — URINALYSIS, ROUTINE W REFLEX MICROSCOPIC
Bilirubin Urine: NEGATIVE
Glucose, UA: NEGATIVE mg/dL
KETONES UR: NEGATIVE mg/dL
NITRITE: NEGATIVE
PROTEIN: NEGATIVE mg/dL
SPECIFIC GRAVITY, URINE: 1.008 (ref 1.005–1.030)
Urobilinogen, UA: 1 mg/dL (ref 0.0–1.0)
pH: 6.5 (ref 5.0–8.0)

## 2013-10-21 NOTE — Progress Notes (Signed)
Case discussed with Dr. Schooler soon after the resident saw the patient.  We reviewed the resident's history and exam and pertinent patient test results.  I agree with the assessment, diagnosis, and plan of care documented in the resident's note. 

## 2013-10-21 NOTE — Telephone Encounter (Signed)
Call from pt - states unable to swallow Cipro tablets; too big and cannot be crushed. Can u prescribed something else?  Thanks

## 2013-10-22 ENCOUNTER — Other Ambulatory Visit: Payer: Self-pay | Admitting: Internal Medicine

## 2013-10-22 LAB — URINE CULTURE

## 2013-10-22 MED ORDER — CIPROFLOXACIN 250 MG/5ML (5%) PO SUSR: 250.0000 mg | Freq: Two times a day (BID) | ORAL | Status: DC

## 2013-10-22 NOTE — Telephone Encounter (Signed)
I have changed it to Cipro suspension.  She should take 250 mg (1tsp) q12hrs for 7 days

## 2013-10-24 ENCOUNTER — Other Ambulatory Visit: Payer: Self-pay | Admitting: Internal Medicine

## 2013-10-24 DIAGNOSIS — N390 Urinary tract infection, site not specified: Secondary | ICD-10-CM

## 2013-10-24 MED ORDER — SULFAMETHOXAZOLE-TRIMETHOPRIM 200-40 MG/5ML PO SUSP: 20.0000 mL | Freq: Two times a day (BID) | ORAL | Status: DC

## 2013-10-24 NOTE — Telephone Encounter (Signed)
Pt states [facility] will have the suspension ready for her today.

## 2013-10-29 ENCOUNTER — Telehealth: Payer: Self-pay | Admitting: *Deleted

## 2013-10-29 NOTE — Telephone Encounter (Signed)
Pt seen in clinic on 3/16 for UTI, treated with Bactrim for 3 days.  Pt called in today with c/o dysuria,  Color of urine improved, frequency is better, no fever. Pt asking for more meds. Pt # [id number]

## 2013-10-30 ENCOUNTER — Other Ambulatory Visit: Payer: Self-pay | Admitting: Internal Medicine

## 2013-10-30 DIAGNOSIS — N390 Urinary tract infection, site not specified: Secondary | ICD-10-CM

## 2013-10-30 MED ORDER — SULFAMETHOXAZOLE-TRIMETHOPRIM 200-40 MG/5ML PO SUSP: 20.0000 mL | Freq: Two times a day (BID) | ORAL | Status: DC

## 2013-10-30 NOTE — Telephone Encounter (Signed)
Pt informed

## 2013-10-30 NOTE — Telephone Encounter (Signed)
Additional Bactrim has been sent in.  This will make her total therapy 10 days (the 1st Rx had enough for 5 days worth).  If she continues to have symptoms she will need to have her urine retested.

## 2013-12-17 ENCOUNTER — Telehealth: Payer: Self-pay | Admitting: Internal Medicine

## 2013-12-18 NOTE — Telephone Encounter (Signed)
Patient states she has had nausea and dysphagia x 3 weeks. States she went to therapy for dysphagia and has done well until 3 weeks ago. She reports the nausea is new. Denies vomiting or pain. Scheduled with Nicoletta Ba, PA on 12/19/13 at 1:30 PM.

## 2013-12-19 ENCOUNTER — Other Ambulatory Visit: Payer: Self-pay | Admitting: *Deleted

## 2013-12-19 ENCOUNTER — Other Ambulatory Visit (INDEPENDENT_AMBULATORY_CARE_PROVIDER_SITE_OTHER): Payer: Medicare PPO

## 2013-12-19 ENCOUNTER — Ambulatory Visit (INDEPENDENT_AMBULATORY_CARE_PROVIDER_SITE_OTHER): Payer: Medicare PPO | Admitting: Physician Assistant

## 2013-12-19 ENCOUNTER — Encounter: Payer: Self-pay | Admitting: Physician Assistant

## 2013-12-19 VITALS — BP 104/64 | HR 68 | Ht 69.0 in | Wt 188.2 lb

## 2013-12-19 DIAGNOSIS — R1314 Dysphagia, pharyngoesophageal phase: Secondary | ICD-10-CM

## 2013-12-19 DIAGNOSIS — IMO0001 Reserved for inherently not codable concepts without codable children: Secondary | ICD-10-CM

## 2013-12-19 DIAGNOSIS — D649 Anemia, unspecified: Secondary | ICD-10-CM

## 2013-12-19 DIAGNOSIS — K219 Gastro-esophageal reflux disease without esophagitis: Secondary | ICD-10-CM

## 2013-12-19 DIAGNOSIS — K222 Esophageal obstruction: Secondary | ICD-10-CM

## 2013-12-19 DIAGNOSIS — R1319 Other dysphagia: Secondary | ICD-10-CM

## 2013-12-19 LAB — CBC WITH DIFFERENTIAL/PLATELET
BASOS PCT: 0.4 % (ref 0.0–3.0)
Basophils Absolute: 0 10*3/uL (ref 0.0–0.1)
EOS ABS: 0 10*3/uL (ref 0.0–0.7)
Eosinophils Relative: 0 % (ref 0.0–5.0)
HEMATOCRIT: 34.6 % — AB (ref 36.0–46.0)
Hemoglobin: 11.7 g/dL — ABNORMAL LOW (ref 12.0–15.0)
LYMPHS ABS: 3.8 10*3/uL (ref 0.7–4.0)
Lymphocytes Relative: 52.1 % — ABNORMAL HIGH (ref 12.0–46.0)
MCHC: 33.8 g/dL (ref 30.0–36.0)
MCV: 102.6 fl — AB (ref 78.0–100.0)
MONO ABS: 0.5 10*3/uL (ref 0.1–1.0)
Monocytes Relative: 6.3 % (ref 3.0–12.0)
NEUTROS ABS: 3 10*3/uL (ref 1.4–7.7)
Neutrophils Relative %: 41.2 % — ABNORMAL LOW (ref 43.0–77.0)
Platelets: 282 10*3/uL (ref 150.0–400.0)
RBC: 3.37 Mil/uL — AB (ref 3.87–5.11)
RDW: 12.9 % (ref 11.5–15.5)
WBC: 7.3 10*3/uL (ref 4.0–10.5)

## 2013-12-19 LAB — BASIC METABOLIC PANEL
BUN: 12 mg/dL (ref 6–23)
CALCIUM: 9 mg/dL (ref 8.4–10.5)
CO2: 30 mEq/L (ref 19–32)
CREATININE: 0.5 mg/dL (ref 0.4–1.2)
Chloride: 92 mEq/L — ABNORMAL LOW (ref 96–112)
GFR: 176.57 mL/min (ref >60.00)
Glucose, Bld: 90 mg/dL (ref 70–99)
Potassium: 3.6 mEq/L (ref 3.5–5.1)
Sodium: 129 mEq/L — ABNORMAL LOW (ref 135–145)

## 2013-12-19 LAB — CK: CK TOTAL: 773 U/L — AB (ref 7–177)

## 2013-12-19 MED ORDER — ONDANSETRON 4 MG PO TBDP: ORAL_TABLET | ORAL | Status: DC

## 2013-12-19 MED ORDER — PANTOPRAZOLE SODIUM 40 MG PO TBEC: 40.0000 mg | DELAYED_RELEASE_TABLET | Freq: Every day | ORAL | Status: DC

## 2013-12-19 NOTE — Patient Instructions (Addendum)
Please go to the basement level to have your labs drawn.  We have scheduled a swallowing study test for Wed 12-24-2013. Arrive at 10 :15 am .  Have nothing by mouth after midnight. Arrive to [facility], go to patient registration desk inside the front door of the hospital.  Someone from [facility] will be calling you with an appointment.   We sent refills to [facility].  1. Protonix 40 mg 2. Zofran 4 mg for nausea

## 2013-12-19 NOTE — Progress Notes (Signed)
Sauubjective:    Patient ID: Heather [name], female    DOB: 10-15-1940, 73 y.o.   MRN: [id number]  HPI  [name] is a pleasant 73 year old African American female known to Dr. Carlean Purl. 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 She was also noted to have a somewhat tortuous esophagus at that time. When she was last seen she was set up for a modified barium swallow with speech path and was found to have severe cervical and pharyngeal phase dysphagia with some pooling. She was rare recommended to stay on a thin liquid ,mechanical soft diet. Unfortunately she has a diagnosis of an inflammatory myositis or inclusion body myositis. She has difficulty ambulating do to bilateral weakness in her legs and says she has developed progressive weakness now in her right lowerextremity and her left upper extremity. She says she has had more difficulty swallowing over the past month and also has developed nausea. She says she has a low-level queasiness present most of the time -somewhat better with by mouth intake. She denies any abdominal pain. Her appetite has been somewhat decreased and she's been eating a bit less. He says she's been fatigued over the past month has absolutely no energy and feels mentally slowed as well. She has had about a 10 pound weight loss over the past couple months as well. Patient is status post cholecystectomy, has not been on any regular NSAIDs. She is maintained on pantoprazole 20 mg by mouth every morning. She currently denies any difficulty with swallowing liquids but says that she frequently has trouble with solid food and will have episodes of regurgitation. She has been evaluated by neurology, Dr. [name] and has been told that her myositis may progress      Review of Systems  Constitutional: Positive for activity change and fatigue.  HENT: Positive for trouble  swallowing.   Cardiovascular: Negative.   Gastrointestinal: Positive for constipation.  Endocrine: Negative.   Genitourinary: Negative.   Musculoskeletal: Positive for gait problem and myalgias.  Skin: Negative.   Allergic/Immunologic: Negative.   Neurological: Positive for weakness.  Psychiatric/Behavioral: Negative.    Outpatient Prescriptions Prior to Visit  Medication Sig Dispense Refill  . amLODipine (NORVASC) 5 MG tablet Take 1 tablet (5 mg total) by mouth daily.  90 tablet  3  . Cetirizine HCl 10 MG CAPS Take 1 capsule (10 mg total) by mouth daily.  90 capsule  0  . chlorpheniramine (CHLOR-TRIMETON) 4 MG tablet Take 1 tablet (4 mg total) by mouth 2 (two) times daily as needed for allergies.  14 tablet  0  . cloNIDine (CATAPRES) 0.1 MG tablet Take 1 tablet (0.1 mg total) by mouth 2 (two) times daily.  180 tablet  3  . esomeprazole (NEXIUM) 40 MG capsule Take 1 capsule (40 mg total) by mouth daily before breakfast.  30 capsule  6  . FLUoxetine (PROZAC) 40 MG capsule Take 1 capsule (40 mg total) by mouth daily.  30 capsule  12  . hydrochlorothiazide (HYDRODIURIL) 25 MG tablet Take 1 tablet (25 mg total) by mouth daily.  90 tablet  3  . HYDROcodone-acetaminophen (NORCO/VICODIN) 5-325 MG per tablet Take 1 tablet by mouth every 6 (six) hours as needed for pain.  20 tablet  0  . hydrocortisone cream 1 % Apply to affected area 2 times daily  30 g  1  .  metoprolol (LOPRESSOR) 100 MG tablet TAKE 1 TABLET TWICE DAILY  180 tablet  3  . Multiple Vitamin (MULTIVITAMIN) capsule Take 1 capsule by mouth daily.  30 capsule  1  . pantoprazole (PROTONIX) 20 MG tablet Take 1 tablet (20 mg total) by mouth daily.  30 tablet  6  . spironolactone (ALDACTONE) 50 MG tablet Take 0.5 tablets (25 mg total) by mouth daily.  90 tablet  3  . triamcinolone (NASACORT AQ) 55 MCG/ACT AERO nasal inhaler Place 1 spray into the nose 2 (two) times daily.  1 Inhaler  2  . albuterol (PROAIR HFA) 108 (90 BASE) MCG/ACT inhaler  Inhale 2 puffs into the lungs every 6 (six) hours as needed for shortness of breath.  1 Inhaler  1  . amitriptyline (ELAVIL) 50 MG tablet Take 1 tablet (50 mg total) by mouth at bedtime.  90 tablet  3  . sucralfate (CARAFATE) 1 GM/10ML suspension Take 1 gram liquid 3 times daily.  420 mL  0  . sulfamethoxazole-trimethoprim (BACTRIM,SEPTRA) 200-40 MG/5ML suspension Take 20 mLs by mouth 2 (two) times daily.  100 mL  0  . timolol (TIMOPTIC) 0.25 % ophthalmic solution Place 1 drop into the left eye 2 (two) times daily.        No facility-administered medications prior to visit.   Allergies  Allergen Reactions  . Propoxyphene Napsylate Nausea Only  . Darvon   . Latex     Per Patient latex=rash "hands look burned"       Patient Active Problem List   Diagnosis Date Noted  . Upper airway cough syndrome 10/20/2013  . 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 UTI (lower urinary tract infection) 04/28/2013  . Dermatitis 03/10/2013  . 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 Abnormality of gait 11/13/2012  . Muscle weakness 09/17/2012  . Elevated CK-MB level 09/17/2012  . Acute bronchitis 09/17/2012  . Chronic sore throat 08/14/2012  . Chronic cough 08/14/2012  . 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 Swollen lymph nodes 05/28/2012  . Other dysphagia 02/13/2011  . Routine health maintenance 11/08/2010  . History of PSVT (paroxysmal supraventricular tachycardia) 09/08/2010  . NASH (nonalcoholic steatohepatitis)   . Gout   . LGSIL (low grade squamous intraepithelial lesion) on Pap smear   . ESOPHAGEAL STENOSIS 06/25/2008  . DEPRESSION 11/16/2006  . GLAUCOMA NOS 11/16/2006  . HYPERTENSION 11/16/2006  . ALLERGIC RHINITIS, SEASONAL 11/16/2006  . GERD 11/16/2006    Objective:   Physical Exam    well-developed older African American female in no acute distress she ambulates with difficulty, uses a cane. Blood pressure 104/64  pulse 68 height 5 foot 9 weight 188. HEENT; nontraumatic normocephalic EOMI PERRLA sclera anicteric, Supple ;no JVD, Cardiovascular; regular rate and rhythm with S1-S2 no murmur or gallop, Pulmonary ;clear bilaterally, Abdomen; soft nontender nondistended bowel sounds are active there is no palpable mass or hepatosplenomegaly, Rectal; not done, Extremities ;no clubbing cyanosis or edema skin warm and dry, Psych ;mood and affect appropriate though somewhat flat        Assessment & Plan:   #45   73 year old female with an inflammatory myositis/inclusion body myositis with progression of symptoms. She has had increased weakness in her upper and lower extremities and increased difficulty with dysphagia . I feel her current dysphagia is due to the myositis, however she has also had a cricopharyngeal bar with some response to dilation in the past  #2 nausea -etiology not clear she is  status post cholecystectomy rule out gastropathy or peptic ulcer disease  #3 Karlene Lineman  #4 hypertension  #5 depression   Plan ; will schedule for barium swallow to rule out stricture or recurrent cricopharyngeal bar which may potentially be improved with esophageal dilation though I suspect her symptoms are secondary to progression of her myositis  Start Zofran 4 mg every 6-8 hours as needed for nausea  Increase Protonix to 40 mg by mouth every morning prescription given  Check CBC with differential, CMET  and CK  Will schedule patient for a followup consultation with neurology  Further plans pending results of above

## 2013-12-22 ENCOUNTER — Telehealth: Payer: Self-pay | Admitting: *Deleted

## 2013-12-22 NOTE — Telephone Encounter (Signed)
I called the patient to advise her she has an appointment with [facility]

## 2013-12-22 NOTE — Telephone Encounter (Signed)
Appointment with [facility] on 12-30-2013 at 1:00 Pm . Patient advised.

## 2013-12-22 NOTE — Telephone Encounter (Signed)
Patient has an appointment with [facility]

## 2013-12-22 NOTE — Telephone Encounter (Signed)
I called Heather [name] to advise her she has an appointment with [facility].

## 2013-12-22 NOTE — Telephone Encounter (Signed)
Message copied by Tonette Bihari on Mon Dec 22, 2013  3:25 PM ------      Message from: Webb Laws D      Created: Fri Dec 19, 2013  2:30 PM       [facility]   Dr. [name]      Appt scheduled on May 26th @ 1:00p.m.       ------

## 2013-12-24 ENCOUNTER — Telehealth: Payer: Self-pay | Admitting: Physician Assistant

## 2013-12-24 ENCOUNTER — Ambulatory Visit (HOSPITAL_COMMUNITY)
Admission: RE | Admit: 2013-12-24 | Discharge: 2013-12-24 | Disposition: A | Discharge status: Home / Self Care | Payer: Medicare PPO | Source: Ambulatory Visit | Attending: Physician Assistant | Admitting: Physician Assistant

## 2013-12-24 DIAGNOSIS — R1310 Dysphagia, unspecified: Secondary | ICD-10-CM | POA: Insufficient documentation

## 2013-12-24 DIAGNOSIS — R1319 Other dysphagia: Secondary | ICD-10-CM

## 2013-12-24 NOTE — Progress Notes (Signed)
Quick Note:  Not sure as did not seem to before Botox injection might be a possibility via ENT Please refer to Dr. Radene Journey re: dysphagia and cricopharyngeal bar, ? Botox candidate ______

## 2013-12-24 NOTE — Telephone Encounter (Signed)
I called [employer] and did a prior authorization.  The [employer] rep said this Ondansetron 4 mg did not need a prior auth. The reference # is [id number].  I faxed the paper and noted it with the ref # to [address]  Faxed to [phone]. Called patient to advise.

## 2013-12-24 NOTE — Progress Notes (Signed)
Agree with Ms. Esterwood's assessment and plan. Kalisa Girtman E. Dayven Linsley, MD, FACG   

## 2013-12-25 ENCOUNTER — Other Ambulatory Visit: Payer: Self-pay | Admitting: *Deleted

## 2013-12-25 DIAGNOSIS — R1310 Dysphagia, unspecified: Secondary | ICD-10-CM

## 2013-12-30 ENCOUNTER — Encounter: Payer: Self-pay | Admitting: Neurology

## 2013-12-30 ENCOUNTER — Ambulatory Visit (INDEPENDENT_AMBULATORY_CARE_PROVIDER_SITE_OTHER): Payer: Medicare PPO | Admitting: Neurology

## 2013-12-30 VITALS — BP 119/80 | HR 78 | Ht 70.0 in | Wt 186.0 lb

## 2013-12-30 DIAGNOSIS — F3289 Other specified depressive episodes: Secondary | ICD-10-CM

## 2013-12-30 DIAGNOSIS — M6281 Muscle weakness (generalized): Secondary | ICD-10-CM

## 2013-12-30 DIAGNOSIS — R1314 Dysphagia, pharyngoesophageal phase: Secondary | ICD-10-CM

## 2013-12-30 DIAGNOSIS — F329 Major depressive disorder, single episode, unspecified: Secondary | ICD-10-CM

## 2013-12-30 DIAGNOSIS — R748 Abnormal levels of other serum enzymes: Secondary | ICD-10-CM

## 2013-12-30 NOTE — Progress Notes (Signed)
Heather [name] is a very pleasant 73 -year-old right-handed woman, with her friend, referred by my colleague Dr. Rexene Alberts for evaluation of muscle weakness, gait difficulty.  She was first seen by Dr. Rexene Alberts in 08/23/2012 from refer physcian Dr. Mina Marble.   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 Leland Her office, which showied abnormalities especially on needle EMG findings, denervation with fasciculations in multiple muscles.   Laboratory evaluation showed  CMP showed mild increase in AST at 46 and ALT at 63. SPEP was unremarkable, hemoglobin A1c was 5.7, TSH normal. ANA, RPR, CRP normal. [id number] and her vitamin B12 level was high at 1511.  Repeat laboratory evaluation in 09/16/2012. The CK level was still high at 947 and the CK-MB was high at 37.7. The aldolase was high at 15.2.  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 MRI C-spine 09/06/12 showed mild degenerative disc disease without frank compression. She denies any twitching or involuntary movements. She denies skin rash, no sensory changes, she also reported a 3 years history of swallowing difficulty, was contributed to the esophageal constriction, has esophageal stretching in 2011  2012, most recent was in summer of 2013, but she continued to have mild swallowing difficulty. She denies significant muscle aching pain, there was no family history of vascular disease,  She had a repeat EMG nerve conduction study by Dr. Jannifer [facility] in October 21 2012, left median, ulnar nerve was normal. EMG was performed at left upper extremity muscles, there was evidence of spontaneous activity at left extensor index proppers, pronator teres, biceps, triceps, deltoid, was described to recruitment pattern, there was no spontaneous activity at left cervical paraspinal muscles  I have repeated EMG:  right deltoid increased insertion activity, 2 plus spontaneous activity , thin polyspike motor unit potential with early recruitment patterns, right vastus lateralis increased insertion activity,  2 plus spontaneous activity , thin polyspike motor unit potential with early recruitment patterns. Right tibialis anterior: Increased insertion activity, 2+ spontaneous activity, thin polyspike motor unit potential with decreased recruitment patterns.  The above findings are consistent with chronic myopathic changes   She had right vastus lateralis muscle biopsy, which showed evidence of inflammatory myopathy, the pattern of inflammation does not suggest dermatomyositis, polymyositis, or other immune mediated myopathy, such as inclusion body myositis was suggested, inclusion body myositis can not be ruled out even in the absence of rimmed vacuoles.  UPDATE May 26th 2015: She presents with severe pharyngo-cervical esophageal phase dysphagia, confirmed by barium swallowing study in Oct 2014, due to sensorimotor impairments from her myositis. She exhibits nearly absent tongue base retraction and poor pharyngeal contraction resulting in gross pharyngeal stasis. It is hard for her to swallow applesauce, yogurt, hard for her to swallow pill  She  denies significant breathing difficulty, no chest pain,  Her GI physician Dr.  Carlean Purl has mentioned about the possibility of Botox injection, which will potentially further weakening the skeleton muscles, I would think PEG tube placement would be the choice.  After discussed with the patient, we decided to refer her to [facility] clinic as soon as possible for 2nd opinion, [facility] was recommended to previously, but she decided not to proceed before. Now she agrees,  Review of Systems  Out of a complete 14 system review, the patient complains of only the following symptoms, and all other reviewed systems are negative.  Worsening gait difficulty, swallowing difficulty  PHYSICAL EXAMINATOINS:  Generalized: In no acute distress  Neck: Supple, no carotid bruits   Cardiac: Regular rate rhythm  Pulmonary: Clear to auscultation bilaterally,   Musculoskeletal: No deformity  Neurological examination  Mentation: Alert oriented to time, place, history taking, and causual conversation,mild weakness in her cough  Cranial nerve II-XII: Pupils were equal round reactive to light extraocular movements were full, visual field were full on confrontational test. facial sensation were normal. She has moderate bilateral eye-closure, cheek puff weakness. There was no significant tongue atrophy, or fasciculation noticed. Hearing was intact to finger rubbing bilaterally. Uvula tongue midline.  head turning and shoulder shrug and were normal and symmetric she has mild weakness in tongue protrusion into cheek strength  Motor examination: she has mild to modreate neck flexion,mild to moderate bilateral shoulder abduction elbow flexion , mild elbow extension weakness, she also has moderate bilateral finger flexion weakness especially distal finger flexion, moderate hip flexion weakness, moderate knee extension weakness, 4/3+, knee flexion 4/4-, ankle dorsiflexon 3/1.   Sensory: Intact to fine touch, pinprick, preserved vibratory sensation, and proprioception at toes.  Coordination:  Normal finger to nose, heel-to-shin bilaterally there was no truncal ataxia  Gait:  wide-based, very cautious, difficulty lifting her legs up, relies on walker   Romberg signs: Negative  Deep tendon reflexes: Brachioradialis 2/2, biceps 2/2, triceps 2/2, patellar   absent Achilles absent, plantar responses were flexor bilaterally.   Assessment And plan: Heather [name] is a very pleasant 73 year old right-handed woman with a history of more than 3 years progressive weakness affecting both upper and lower extremity, she also has moderate facial diplegia, swallowing difficulties, bilateral quadriceps and finger flexion weakness,  with elevated mild to modreate CPK, inflammatory myopathic changes on EMGs, muscle biopsy also consistent with inflammatory changes, most consistent with inclusion body myositis, now she has progressive worsening dysphagia, especially pharyngeal phases, indicating skeletal muscle involvement of her upper esophagus, pharyngeal stasis, Botox would not be a good choice for her, PEG tube could be a long-term solution, after extensive discussion with patient, I will refer her to [facility], for second opinion, but if they agrees, will arrange PEG tube placement.  Follow up in end of June after [facility] visit.

## 2014-01-28 ENCOUNTER — Telehealth: Payer: Self-pay | Admitting: Neurology

## 2014-01-28 NOTE — Telephone Encounter (Signed)
Pt is inquiring about the referral to [facility], per OV note on 12/30/13. Please advise

## 2014-01-28 NOTE — Telephone Encounter (Signed)
Patient checking on status of referral to [facility] as discussed at 5/26 OV.  Please call and advise.

## 2014-01-29 ENCOUNTER — Telehealth: Payer: Self-pay | Admitting: Neurology

## 2014-01-29 NOTE — Telephone Encounter (Signed)
Dana: Please check on her referred to [facility]

## 2014-01-29 NOTE — Telephone Encounter (Signed)
Patient wanting to know what hospital and phone number she is supposed to be at tomorrow (she thinks this is for a feeding tube consult up in [place]). Apparently they called her on Monday to remind her of the appointment and she did not write it down. Please call to advise. [phone]

## 2014-01-30 NOTE — Telephone Encounter (Signed)
Patient was scheduled at [facility] clinic already her appt was today she was told and she said she forgot she said she could not find directions [facility] sent her. I told patient that I would call her and get her another appt with [facility].  Scat paper work has already been sent to her and faxed. Patient stated she did get her scat paper work.

## 2014-01-30 NOTE — Telephone Encounter (Signed)
Jana Half from [facility] called and she will call and get patient rescheduled for appt.

## 2014-03-22 ENCOUNTER — Encounter (HOSPITAL_COMMUNITY): Payer: Self-pay | Admitting: Emergency Medicine

## 2014-03-22 ENCOUNTER — Inpatient Hospital Stay (HOSPITAL_COMMUNITY)
Admission: EM | Admit: 2014-03-22 | Discharge: 2014-03-25 | DRG: 312 | Disposition: A | Discharge status: Skilled Nursing Facility | Payer: Medicare PPO | Attending: Internal Medicine | Admitting: Internal Medicine

## 2014-03-22 ENCOUNTER — Emergency Department (HOSPITAL_COMMUNITY): Payer: Medicare PPO

## 2014-03-22 DIAGNOSIS — I872 Venous insufficiency (chronic) (peripheral): Secondary | ICD-10-CM | POA: Diagnosis present

## 2014-03-22 DIAGNOSIS — IMO0001 Reserved for inherently not codable concepts without codable children: Secondary | ICD-10-CM | POA: Diagnosis present

## 2014-03-22 DIAGNOSIS — M609 Myositis, unspecified: Secondary | ICD-10-CM

## 2014-03-22 DIAGNOSIS — H409 Unspecified glaucoma: Secondary | ICD-10-CM | POA: Diagnosis present

## 2014-03-22 DIAGNOSIS — S0101XA Laceration without foreign body of scalp, initial encounter: Secondary | ICD-10-CM

## 2014-03-22 DIAGNOSIS — W010XXA Fall on same level from slipping, tripping and stumbling without subsequent striking against object, initial encounter: Secondary | ICD-10-CM | POA: Diagnosis present

## 2014-03-22 DIAGNOSIS — Z0000 Encounter for general adult medical examination without abnormal findings: Secondary | ICD-10-CM

## 2014-03-22 DIAGNOSIS — F3289 Other specified depressive episodes: Secondary | ICD-10-CM

## 2014-03-22 DIAGNOSIS — R1314 Dysphagia, pharyngoesophageal phase: Secondary | ICD-10-CM | POA: Diagnosis present

## 2014-03-22 DIAGNOSIS — Y92009 Unspecified place in unspecified non-institutional (private) residence as the place of occurrence of the external cause: Secondary | ICD-10-CM | POA: Diagnosis not present

## 2014-03-22 DIAGNOSIS — I10 Essential (primary) hypertension: Secondary | ICD-10-CM | POA: Diagnosis present

## 2014-03-22 DIAGNOSIS — Y93E5 Activity, floor mopping and cleaning: Secondary | ICD-10-CM | POA: Diagnosis not present

## 2014-03-22 DIAGNOSIS — L309 Dermatitis, unspecified: Secondary | ICD-10-CM

## 2014-03-22 DIAGNOSIS — S0100XA Unspecified open wound of scalp, initial encounter: Secondary | ICD-10-CM | POA: Diagnosis present

## 2014-03-22 DIAGNOSIS — R058 Other specified cough: Secondary | ICD-10-CM

## 2014-03-22 DIAGNOSIS — R599 Enlarged lymph nodes, unspecified: Secondary | ICD-10-CM

## 2014-03-22 DIAGNOSIS — Z823 Family history of stroke: Secondary | ICD-10-CM

## 2014-03-22 DIAGNOSIS — W19XXXA Unspecified fall, initial encounter: Secondary | ICD-10-CM

## 2014-03-22 DIAGNOSIS — R053 Chronic cough: Secondary | ICD-10-CM

## 2014-03-22 DIAGNOSIS — J301 Allergic rhinitis due to pollen: Secondary | ICD-10-CM

## 2014-03-22 DIAGNOSIS — K222 Esophageal obstruction: Secondary | ICD-10-CM

## 2014-03-22 DIAGNOSIS — G7241 Inclusion body myositis [IBM]: Secondary | ICD-10-CM | POA: Diagnosis present

## 2014-03-22 DIAGNOSIS — M6281 Muscle weakness (generalized): Secondary | ICD-10-CM

## 2014-03-22 DIAGNOSIS — M1710 Unilateral primary osteoarthritis, unspecified knee: Secondary | ICD-10-CM

## 2014-03-22 DIAGNOSIS — R269 Unspecified abnormalities of gait and mobility: Secondary | ICD-10-CM

## 2014-03-22 DIAGNOSIS — R55 Syncope and collapse: Secondary | ICD-10-CM | POA: Diagnosis present

## 2014-03-22 DIAGNOSIS — F329 Major depressive disorder, single episode, unspecified: Secondary | ICD-10-CM

## 2014-03-22 DIAGNOSIS — Z87891 Personal history of nicotine dependence: Secondary | ICD-10-CM | POA: Diagnosis not present

## 2014-03-22 DIAGNOSIS — N390 Urinary tract infection, site not specified: Secondary | ICD-10-CM

## 2014-03-22 DIAGNOSIS — I951 Orthostatic hypotension: Secondary | ICD-10-CM | POA: Diagnosis present

## 2014-03-22 DIAGNOSIS — R05 Cough: Secondary | ICD-10-CM

## 2014-03-22 DIAGNOSIS — K7581 Nonalcoholic steatohepatitis (NASH): Secondary | ICD-10-CM

## 2014-03-22 DIAGNOSIS — R748 Abnormal levels of other serum enzymes: Secondary | ICD-10-CM

## 2014-03-22 DIAGNOSIS — K219 Gastro-esophageal reflux disease without esophagitis: Secondary | ICD-10-CM | POA: Diagnosis present

## 2014-03-22 DIAGNOSIS — Z8679 Personal history of other diseases of the circulatory system: Secondary | ICD-10-CM

## 2014-03-22 DIAGNOSIS — J312 Chronic pharyngitis: Secondary | ICD-10-CM

## 2014-03-22 DIAGNOSIS — R1319 Other dysphagia: Secondary | ICD-10-CM | POA: Diagnosis present

## 2014-03-22 DIAGNOSIS — R531 Weakness: Secondary | ICD-10-CM

## 2014-03-22 LAB — URINALYSIS, ROUTINE W REFLEX MICROSCOPIC
BILIRUBIN URINE: NEGATIVE
Glucose, UA: NEGATIVE mg/dL
HGB URINE DIPSTICK: NEGATIVE
KETONES UR: NEGATIVE mg/dL
Leukocytes, UA: NEGATIVE
NITRITE: NEGATIVE
PH: 7 (ref 5.0–8.0)
Protein, ur: NEGATIVE mg/dL
Specific Gravity, Urine: 1.005 (ref 1.005–1.030)
Urobilinogen, UA: 0.2 mg/dL (ref 0.0–1.0)

## 2014-03-22 LAB — COMPREHENSIVE METABOLIC PANEL
ALT: 34 U/L (ref 0–35)
AST: 34 U/L (ref 0–37)
Albumin: 4.1 g/dL (ref 3.5–5.2)
Alkaline Phosphatase: 74 U/L (ref 39–117)
Anion gap: 13 (ref 5–15)
BUN: 9 mg/dL (ref 6–23)
CHLORIDE: 90 meq/L — AB (ref 96–112)
CO2: 28 mEq/L (ref 19–32)
Calcium: 10.2 mg/dL (ref 8.4–10.5)
Creatinine, Ser: 0.45 mg/dL — ABNORMAL LOW (ref 0.50–1.10)
GFR calc Af Amer: >90 mL/min (ref >90)
GLUCOSE: 139 mg/dL — AB (ref 70–99)
Potassium: 3.6 mEq/L — ABNORMAL LOW (ref 3.7–5.3)
Sodium: 131 mEq/L — ABNORMAL LOW (ref 137–147)
Total Bilirubin: 0.3 mg/dL (ref 0.3–1.2)
Total Protein: 7.9 g/dL (ref 6.0–8.3)

## 2014-03-22 LAB — CBC WITH DIFFERENTIAL/PLATELET
BASOS PCT: 0 % (ref 0–1)
Basophils Absolute: 0 10*3/uL (ref 0.0–0.1)
EOS PCT: 0 % (ref 0–5)
Eosinophils Absolute: 0 10*3/uL (ref 0.0–0.7)
HCT: 35.3 % — ABNORMAL LOW (ref 36.0–46.0)
HEMOGLOBIN: 12.8 g/dL (ref 12.0–15.0)
Lymphocytes Relative: 32 % (ref 12–46)
Lymphs Abs: 4 10*3/uL (ref 0.7–4.0)
MCH: 34.4 pg — AB (ref 26.0–34.0)
MCHC: 36.3 g/dL — ABNORMAL HIGH (ref 30.0–36.0)
MCV: 94.9 fL (ref 78.0–100.0)
MONO ABS: 0.5 10*3/uL (ref 0.1–1.0)
Monocytes Relative: 4 % (ref 3–12)
NEUTROS ABS: 7.9 10*3/uL — AB (ref 1.7–7.7)
NEUTROS PCT: 64 % (ref 43–77)
Platelets: ADEQUATE 10*3/uL (ref 150–400)
RBC: 3.72 MIL/uL — ABNORMAL LOW (ref 3.87–5.11)
RDW: 12.5 % (ref 11.5–15.5)
SMEAR REVIEW: ADEQUATE
WBC: 12.4 10*3/uL — ABNORMAL HIGH (ref 4.0–10.5)

## 2014-03-22 MED ORDER — SODIUM CHLORIDE 0.9 % IV BOLUS (SEPSIS)
1000.0000 mL | Freq: Once | INTRAVENOUS | Status: AC
  Administered 2014-03-22: 1000 mL via INTRAVENOUS

## 2014-03-22 MED ORDER — ONDANSETRON 4 MG PO TBDP
4.0000 mg | ORAL_TABLET | Freq: Once | ORAL | Status: AC
  Administered 2014-03-22: 4 mg via ORAL
  Filled 2014-03-22: qty 1

## 2014-03-22 MED ORDER — TETANUS-DIPHTH-ACELL PERTUSSIS 5-2.5-18.5 LF-MCG/0.5 IM SUSP
0.5000 mL | Freq: Once | INTRAMUSCULAR | Status: AC
  Administered 2014-03-22: 0.5 mL via INTRAMUSCULAR

## 2014-03-22 MED ORDER — HYDROCODONE-ACETAMINOPHEN 7.5-325 MG/15ML PO SOLN
10.0000 mL | Freq: Once | ORAL | Status: AC
  Administered 2014-03-22: 10 mL via ORAL
  Filled 2014-03-22: qty 15

## 2014-03-22 MED ORDER — TETANUS-DIPHTH-ACELL PERTUSSIS 5-2.5-18.5 LF-MCG/0.5 IM SUSP
0.5000 mL | Freq: Once | INTRAMUSCULAR | Status: AC
  Filled 2014-03-22: qty 0.5

## 2014-03-22 MED ORDER — LIDOCAINE-EPINEPHRINE 1 %-1:100000 IJ SOLN
10.0000 mL | Freq: Once | INTRAMUSCULAR | Status: AC
  Administered 2014-03-22: 10 mL
  Filled 2014-03-22: qty 10

## 2014-03-22 MED ORDER — OXYCODONE-ACETAMINOPHEN 5-325 MG PO TABS
2.0000 | ORAL_TABLET | Freq: Once | ORAL | Status: AC
  Administered 2014-03-22: 2 via ORAL
  Filled 2014-03-22: qty 2

## 2014-03-22 NOTE — ED Notes (Signed)
Per [facility]. Pt fell while mopping kitchen floor resulting in laceration to back of head. Approx. 1/2 inch/ Bleeding controlled upon arrival. NO LOC. GCS 15 PERRL. [facility] cleared. Pt denies neck and new back pain. HX of chronic back pain. C/o of HA. HX of a condition that causes gait and balances problems.

## 2014-03-22 NOTE — ED Notes (Signed)
MD at bedside. EDP RAY AT BS SUTURING PT

## 2014-03-22 NOTE — Discharge Instructions (Signed)
Staples out in 5-7 days  Fall Prevention in Hospitals As a hospital patient, your condition and the treatments you receive can increase your risk for falls. Some additional risk factors for falls in a hospital include:  Being in an unfamiliar environment.  Being on bed rest.  Your surgery.  Taking certain medicines.  Your tubing requirements, such as intravenous (IV) therapy or catheters. It is important that you learn how to decrease fall risks while at the hospital. Below are important tips that can help prevent falls. SAFETY TIPS FOR PREVENTING FALLS Talk about your risk of falling.  Ask your caregiver why you are at risk for falling. Is it your medicine, illness, tubing placement, or something else?  Make a plan with your caregiver to keep you safe from falls.  Ask your caregiver or pharmacist about side effect of your medicines. Some medicines can make you dizzy or affect your coordination. Ask for help.  Ask for help before getting out of bed. You may need to press your call button.  Ask for assistance in getting you safely to the toilet.  Ask for a walker or cane to be put at your bedside. Ask that most of the side rails on your bed be placed up before your caregiver leaves the room.  Ask family or friends to sit with you.  Ask for things that are out of your reach, such as your glasses, hearing aids, telephone, bedside table, or call button. Follow these tips to avoid falling:  Stay lying or seated, rather than standing, while waiting for help.  Wear rubber-soled slippers or shoes whenever you walk in the hospital.  Avoid quick, sudden movements.  Change positions slowly.  Sit on the side of your bed before standing.  Stand up slowly and wait before you start to walk.  Let your caregiver know if there is a spill on the floor.  Pay careful attention to the medical equipment, electrical cords, and tubes around you.  When you need help, use your call button by  your bed or in the bathroom. Wait for one of your caregivers to help you.  If you feel dizzy or unsure of your footing, return to bed and wait for assistance.  Avoid being distracted by the TV, telephone, or another person in your room.  Do not lean or support yourself on rolling objects, such as IV poles or bedside tables. Document Released: 07/21/2000 Document Revised: 07/10/2012 Document Reviewed: 03/31/2012 [facility] Patient Information 2015 [place], [place]. This information is not intended to replace advice given to you by your health care provider. Make sure you discuss any questions you have with your health care provider. Staple Care and Removal Your caregiver has used staples today to repair your wound. Staples are used to help a wound heal faster by holding the edges of the wound together. The staples can be removed when the wound has healed well enough to stay together after the staples are removed. A dressing (wound covering), depending on the location of the wound, may have been applied. This may be changed once per day or as instructed. If the dressing sticks, it may be soaked off with soapy water or hydrogen peroxide. Only take over-the-counter or prescription medicines for pain, discomfort, or fever as directed by your caregiver.  If you did not receive a tetanus shot today because you did not recall when your last one was given, check with your caregiver when you have your staples removed to determine if one is needed.  Return to your caregiver's office in 1 week or as suggested to have your staples removed. SEEK IMMEDIATE MEDICAL CARE IF:   You have redness, swelling, or increasing pain in the wound.  You have pus coming from the wound.  You have a fever.  You notice a bad smell coming from the wound or dressing.  Your wound edges break open after staples have been removed. Document Released: 04/18/2001 Document Revised: 10/16/2011 Document Reviewed: 05/03/2005 [facility]  Patient Information 2015 [place], [place]. This information is not intended to replace advice given to you by your health care provider. Make sure you discuss any questions you have with your health care provider.

## 2014-03-22 NOTE — ED Notes (Signed)
Bed: WA19 Expected date:  Expected time:  Means of arrival:  Comments: Fall  

## 2014-03-22 NOTE — ED Notes (Signed)
Tried to stand up patient and patient could not hardly move feet. Patient was blinking like she was going to pass out. Did not do the standing vitals for orthostatic. Md notified.

## 2014-03-22 NOTE — ED Provider Notes (Addendum)
CSN: [id number]     Arrival date & time 03/22/14  1705 History   First MD Initiated Contact with Patient 03/22/14 1708     Chief Complaint  Patient presents with  . Fall  . Laceration     (Consider location/radiation/quality/duration/timing/severity/associated sxs/prior Treatment) HPI 73 year old female who was mopping her floor and slipped in the water falling backwards striking the back of her head. She denies loss of consciousness she states that she has some pain in her hip. She was unable to get up from the ground up on her own but states that she would not normally be able to get up from the ground level. Secondary to this EMS was called and transported her here. She has a laceration to the back of her head and had wheezing controlled with a pressure dressing. She has some neck pain but denies any lateralized weakness, numbness, or tingling. She denies any chest pain or shortness of breath. She denies any loss of consciousness or feeling lightheaded prior to the fall. Past Medical History  Diagnosis Date  . Depression     Controlled with SSRI  . HTN (hypertension)   . GERD (gastroesophageal reflux disease)     Chronic PPI's  . S/P dilatation of esophageal stricture     7/12 Dr Carlean Purl. Dilation of tight stricture proximal eso.  Prior dilation 09 and 08.  . Seasonal allergies     Zyrtec D PRN  . NASH (nonalcoholic steatohepatitis)     Per [place] 10/10  . History of PSVT (paroxysmal supraventricular tachycardia)     Remote and self limited.  . Gout     No crystal dx. R podegra x 3 episodes around 2010. Did not take the allopurinol that was rec 2011.  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 Chronic venous insufficiency     Present for years. Norvasc D/C'd 2012 thinking it might be contributing.  . Migraines   . Glaucoma   . Neuropathy   . Back pain   . Muscle weakness   . Polymyositis   . 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 Cholecystectomy    . Tubal ligation    . Laparoscopic burch procedure  04/02/07    Burch colposuspension. Inadvertant bladder damage - bladder repair, left ureteral stent placement, .  . Knee arthroscopy    . Abdominal hysterectomy  1980    2/2 menorrhagia  . Colonoscopy  09/28/2006    diverticulosis  . 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 dyspmotility of esophagus  . Bladder repair    . Eye surgery      cataracts, glaucoma  . Muscle biopsy Right 11/18/2012    Procedure: right vastus lateralis muscle biopsy ;  Surgeon: Haywood Lasso, MD;  Location: [facility];  Service: General;  Laterality: Right;  . Esophagogastroduodenoscopy N/A 05/06/2013    Procedure: ESOPHAGOGASTRODUODENOSCOPY (EGD) Probable Venia Minks.;  Surgeon: Jerene Bears, MD;  Location: [facility];  Service: Gastroenterology;  Laterality: N/A;   Family History  Problem Relation Age of Onset  . Heart disease Mother   . Hypertension Mother   . Heart attack Mother   . Heart disease Father   . Hypertension Father   . Stroke Father   . Heart disease Sister   . Hypertension Sister   .  Throat cancer Brother   . Hypertension Brother   . Heart disease Brother   . Colon cancer Neg Hx    History  Substance Use Topics  . Smoking status: Former Smoker    Types: Cigarettes    Quit date: 08/07/1968  . Smokeless tobacco: Never Used  . Alcohol Use: No   OB History   Grav Para Term Preterm Abortions TAB SAB Ect Mult Living                 Review of Systems  All other systems reviewed and are negative.     Allergies  Propoxyphene napsylate; Darvon; and Latex  Home Medications   Prior to Admission medications   Medication Sig Start Date End Date Taking? Authorizing Provider  amLODipine (NORVASC) 5 MG tablet Take 1  tablet (5 mg total) by mouth daily. 03/10/13  Yes Jessee Avers, MD  Cetirizine HCl 10 MG CAPS Take 1 capsule (10 mg total) by mouth daily. 01/17/11  Yes Riddhishkumar Manuella Ghazi, MD  chlorpheniramine (CHLOR-TRIMETON) 4 MG tablet Take 1 tablet (4 mg total) by mouth 2 (two) times daily as needed for allergies. 10/20/13  Yes Valaria Good, MD  cloNIDine (CATAPRES) 0.1 MG tablet Take 1 tablet (0.1 mg total) by mouth 2 (two) times daily. 03/10/13  Yes Jessee Avers, MD  esomeprazole (NEXIUM) 40 MG capsule Take 1 capsule (40 mg total) by mouth daily before breakfast. 07/14/13  Yes Valaria Good, MD  FLUoxetine (PROZAC) 40 MG capsule Take 40 mg by mouth daily.  01/29/14  Yes Historical Provider, MD  HYDROcodone-acetaminophen (NORCO/VICODIN) 5-325 MG per tablet Take 1 tablet by mouth every 6 (six) hours as needed for pain. 03/10/13  Yes Jessee Avers, MD  latanoprost (XALATAN) 0.005 % ophthalmic solution Place 1 drop into the left eye at bedtime.  01/19/14  Yes Historical Provider, MD  metoprolol (LOPRESSOR) 100 MG tablet Take 100 mg by mouth 2 (two) times daily.   Yes Historical Provider, MD  Multiple Vitamin (MULTIVITAMIN) capsule Take 1 capsule by mouth daily. 03/10/13  Yes Jessee Avers, MD  ondansetron (ZOFRAN ODT) 4 MG disintegrating tablet Take 1 tab every 6-8 hours as needed for nausea. 12/19/13  Yes Amy S Esterwood, PA-C  pantoprazole (PROTONIX) 40 MG tablet Take 1 tablet (40 mg total) by mouth daily. 12/19/13  Yes Amy S Esterwood, PA-C  spironolactone (ALDACTONE) 50 MG tablet Take 0.5 tablets (25 mg total) by mouth daily. 03/10/13  Yes Jessee Avers, MD  triamcinolone (NASACORT AQ) 55 MCG/ACT AERO nasal inhaler Place 1 spray into the nose 2 (two) times daily. 10/20/13 10/20/14 Yes Valaria Good, MD   BP 125/60  Pulse 71  Temp(Src) 98.2 F (36.8 C) (Oral)  Resp 18  SpO2 99%  LMP 11/08/1978 Physical Exam  Nursing note and vitals reviewed. Constitutional: She is oriented to person, place, and time.  She appears well-developed and well-nourished.  HENT:  Head: Normocephalic.  Right Ear: External ear normal.  Left Ear: External ear normal.  Stellate laceration to occiput  Eyes: Conjunctivae and EOM are normal. Pupils are equal, round, and reactive to light.  Neck: Normal range of motion. Neck supple.  Cardiovascular: Normal rate, regular rhythm and normal heart sounds.   Pulmonary/Chest: Effort normal and breath sounds normal.  Abdominal: Soft. Bowel sounds are normal.  Musculoskeletal: Normal range of motion.  Mild ttp left buttock, no ttp hips, full arom bilateral hips, knees and ankle  Neurological: She is alert and oriented to person, place, and  time. She displays normal reflexes. No cranial nerve deficit. She exhibits normal muscle tone. Coordination normal.  Patient with decreased proximal muscle strength right thigh greater than left.  Plantar and dorsaflexion of bilateral ankles and great toe 5/5/  Skin: Skin is warm and dry.  Psychiatric: She has a normal mood and affect. Her behavior is normal. Judgment and thought content normal.      ED Course  LACERATION REPAIR Date/Time: 03/22/2014 7:13 PM Performed by: Shaune Pollack Authorized by: Shaune Pollack Consent: Verbal consent obtained. Risks and benefits: risks, benefits and alternatives were discussed Consent given by: patient Patient identity confirmed: verbally with patient Time out: Immediately prior to procedure a "time out" was called to verify the correct patient, procedure, equipment, support staff and site/side marked as required. Body area: head/neck Location details: scalp Laceration length: 7 cm Foreign bodies: no foreign bodies Tendon involvement: none Nerve involvement: none Vascular damage: no Local anesthetic: lidocaine 1% with epinephrine Patient sedated: no Preparation: Patient was prepped and draped in the usual sterile fashion. Irrigation solution: saline Irrigation method: syringe Amount  of cleaning: standard Debridement: none Skin closure: staples Number of sutures: 7 Patient tolerance: Patient tolerated the procedure well with no immediate complications.   (including critical care time) Labs Review Labs Reviewed - No data to display  Imaging Review No results found.   EKG Interpretation None     Ms. Paull is a very pleasant 73 year old right-handed woman with a history of more than 3 years progressive weakness affecting both upper and lower extremity, she also has moderate facial diplegia, swallowing difficulties, bilateral quadriceps and finger flexion weakness,  with elevated mild to modreate CPK, inflammatory myopathic changes on EMGs, muscle biopsy also consistent with inflammatory changes, most consistent with inclusion body myositis, now she has progressive worsening dysphagia, especially pharyngeal phases, indicating skeletal muscle involvement of her upper esophagus, pharyngeal stasis, Botox would not be a good choice for her, PEG tube could be a long-term solution, after extensive discussion with patient, I will refer her to [facility], for second opinion, but if they agrees, will arrange PEG tube placement  MDM   Final diagnoses:  None    74 year old female with mechanical fall resulting in stellate laceration to back of her head. Laceration repaired here in the ED and tetanus prophylaxis given. Radiograph studies including head CT neck CT and pelvis were all obtained and normal. Patient given return precautions and advised regarding followup and to have her staples out in 5-7 days.  Patient had syncopal episode in chair when getting ready for discharge.  She feels nauseated.  She is to be placed back on bed, iv ns, monitor and I will reasssess.   Patient continues to be unable to lift legs against gravity and strength generally decreased consistent with myositis, but now weaker than baseline and unable to ambulate   Discussed with Dr.Elgergawy and  will admit to med surg Shaune Pollack, MD 03/22/14 [id number]  Shaune Pollack, MD 03/23/14 [id number]

## 2014-03-22 NOTE — ED Notes (Signed)
Patient was put in wheelchair and patient passed out hitting her head on the table while sitting in wheelchair. Patient was put back to bed and re attached to monitor. Dr. Jeanell Sparrow called to bedside to check patient out. Dr. Jeanell Sparrow came to bedside and order fluids and IV.Marland Kitchen

## 2014-03-22 NOTE — ED Notes (Signed)
Patient transported to X-ray 

## 2014-03-23 ENCOUNTER — Inpatient Hospital Stay (HOSPITAL_COMMUNITY): Payer: Medicare PPO

## 2014-03-23 DIAGNOSIS — R531 Weakness: Secondary | ICD-10-CM | POA: Diagnosis present

## 2014-03-23 DIAGNOSIS — I951 Orthostatic hypotension: Secondary | ICD-10-CM | POA: Diagnosis present

## 2014-03-23 DIAGNOSIS — M6281 Muscle weakness (generalized): Secondary | ICD-10-CM

## 2014-03-23 DIAGNOSIS — R5381 Other malaise: Secondary | ICD-10-CM

## 2014-03-23 DIAGNOSIS — H409 Unspecified glaucoma: Secondary | ICD-10-CM

## 2014-03-23 DIAGNOSIS — IMO0001 Reserved for inherently not codable concepts without codable children: Secondary | ICD-10-CM

## 2014-03-23 DIAGNOSIS — R5383 Other fatigue: Secondary | ICD-10-CM

## 2014-03-23 DIAGNOSIS — Z87891 Personal history of nicotine dependence: Secondary | ICD-10-CM | POA: Diagnosis not present

## 2014-03-23 DIAGNOSIS — W010XXA Fall on same level from slipping, tripping and stumbling without subsequent striking against object, initial encounter: Secondary | ICD-10-CM | POA: Diagnosis present

## 2014-03-23 DIAGNOSIS — K219 Gastro-esophageal reflux disease without esophagitis: Secondary | ICD-10-CM | POA: Diagnosis present

## 2014-03-23 DIAGNOSIS — R55 Syncope and collapse: Secondary | ICD-10-CM | POA: Diagnosis present

## 2014-03-23 DIAGNOSIS — Z823 Family history of stroke: Secondary | ICD-10-CM | POA: Diagnosis not present

## 2014-03-23 DIAGNOSIS — W19XXXA Unspecified fall, initial encounter: Secondary | ICD-10-CM

## 2014-03-23 DIAGNOSIS — Y92009 Unspecified place in unspecified non-institutional (private) residence as the place of occurrence of the external cause: Secondary | ICD-10-CM | POA: Diagnosis not present

## 2014-03-23 DIAGNOSIS — S0100XA Unspecified open wound of scalp, initial encounter: Secondary | ICD-10-CM | POA: Diagnosis present

## 2014-03-23 DIAGNOSIS — I872 Venous insufficiency (chronic) (peripheral): Secondary | ICD-10-CM | POA: Diagnosis present

## 2014-03-23 DIAGNOSIS — Y93E5 Activity, floor mopping and cleaning: Secondary | ICD-10-CM | POA: Diagnosis not present

## 2014-03-23 DIAGNOSIS — R1314 Dysphagia, pharyngoesophageal phase: Secondary | ICD-10-CM | POA: Diagnosis present

## 2014-03-23 DIAGNOSIS — G7241 Inclusion body myositis [IBM]: Secondary | ICD-10-CM | POA: Diagnosis present

## 2014-03-23 DIAGNOSIS — I10 Essential (primary) hypertension: Secondary | ICD-10-CM | POA: Diagnosis present

## 2014-03-23 LAB — CBC
HCT: 32.7 % — ABNORMAL LOW (ref 36.0–46.0)
Hemoglobin: 11.7 g/dL — ABNORMAL LOW (ref 12.0–15.0)
MCH: 33.8 pg (ref 26.0–34.0)
MCHC: 35.8 g/dL (ref 30.0–36.0)
MCV: 94.5 fL (ref 78.0–100.0)
Platelets: 281 10*3/uL (ref 150–400)
RBC: 3.46 MIL/uL — ABNORMAL LOW (ref 3.87–5.11)
RDW: 12.7 % (ref 11.5–15.5)
WBC: 8 10*3/uL (ref 4.0–10.5)

## 2014-03-23 LAB — HEPATIC FUNCTION PANEL
ALT: 28 U/L (ref 0–35)
AST: 27 U/L (ref 0–37)
Albumin: 3.4 g/dL — ABNORMAL LOW (ref 3.5–5.2)
Alkaline Phosphatase: 62 U/L (ref 39–117)
Bilirubin, Direct: <0.2 mg/dL (ref 0.0–0.3)
TOTAL PROTEIN: 7 g/dL (ref 6.0–8.3)
Total Bilirubin: 0.4 mg/dL (ref 0.3–1.2)

## 2014-03-23 LAB — BASIC METABOLIC PANEL
ANION GAP: 10 (ref 5–15)
BUN: 8 mg/dL (ref 6–23)
CALCIUM: 9 mg/dL (ref 8.4–10.5)
CHLORIDE: 97 meq/L (ref 96–112)
CO2: 27 mEq/L (ref 19–32)
Creatinine, Ser: 0.4 mg/dL — ABNORMAL LOW (ref 0.50–1.10)
GFR calc non Af Amer: >90 mL/min (ref >90)
Glucose, Bld: 111 mg/dL — ABNORMAL HIGH (ref 70–99)
Potassium: 3.2 mEq/L — ABNORMAL LOW (ref 3.7–5.3)
Sodium: 134 mEq/L — ABNORMAL LOW (ref 137–147)

## 2014-03-23 LAB — CK: CK TOTAL: 499 U/L — AB (ref 7–177)

## 2014-03-23 LAB — MAGNESIUM: Magnesium: 1.8 mg/dL (ref 1.5–2.5)

## 2014-03-23 MED ORDER — CLONIDINE HCL 0.1 MG PO TABS
0.1000 mg | ORAL_TABLET | Freq: Two times a day (BID) | ORAL | Status: DC
  Administered 2014-03-23: 0.1 mg via ORAL
  Filled 2014-03-23 (×3): qty 1

## 2014-03-23 MED ORDER — ALBUTEROL SULFATE (2.5 MG/3ML) 0.083% IN NEBU: 2.5000 mg | INHALATION_SOLUTION | RESPIRATORY_TRACT | Status: DC | PRN

## 2014-03-23 MED ORDER — ACETAMINOPHEN 650 MG RE SUPP
650.0000 mg | Freq: Four times a day (QID) | RECTAL | Status: DC | PRN
  Administered 2014-03-23: 650 mg via RECTAL
  Filled 2014-03-23: qty 1

## 2014-03-23 MED ORDER — ONDANSETRON HCL 4 MG/2ML IJ SOLN
4.0000 mg | Freq: Four times a day (QID) | INTRAMUSCULAR | Status: DC | PRN
Start: 2014-03-23 — End: 2014-03-25
  Administered 2014-03-23 (×2): 4 mg via INTRAVENOUS
  Filled 2014-03-23 (×2): qty 2

## 2014-03-23 MED ORDER — SODIUM CHLORIDE 0.9 % IV SOLN
INTRAVENOUS | Status: DC
  Administered 2014-03-23 (×2): via INTRAVENOUS
  Filled 2014-03-23 (×3): qty 1000

## 2014-03-23 MED ORDER — ACETAMINOPHEN 325 MG PO TABS
650.0000 mg | ORAL_TABLET | Freq: Four times a day (QID) | ORAL | Status: DC | PRN
  Administered 2014-03-23 – 2014-03-25 (×3): 650 mg via ORAL
  Filled 2014-03-23 (×3): qty 2

## 2014-03-23 MED ORDER — SODIUM CHLORIDE 0.9 % IV BOLUS (SEPSIS)
1000.0000 mL | Freq: Once | INTRAVENOUS | Status: AC
  Administered 2014-03-23: 1000 mL via INTRAVENOUS

## 2014-03-23 MED ORDER — BOOST PLUS PO LIQD
237.0000 mL | Freq: Three times a day (TID) | ORAL | Status: DC
  Administered 2014-03-23 – 2014-03-25 (×6): 237 mL via ORAL
  Filled 2014-03-23 (×7): qty 237

## 2014-03-23 MED ORDER — LATANOPROST 0.005 % OP SOLN
1.0000 [drp] | Freq: Every day | OPHTHALMIC | Status: DC
  Administered 2014-03-23 – 2014-03-24 (×3): 1 [drp] via OPHTHALMIC
  Filled 2014-03-23: qty 2.5

## 2014-03-23 MED ORDER — SODIUM CHLORIDE 0.9 % IV SOLN: INTRAVENOUS | Status: DC

## 2014-03-23 MED ORDER — POTASSIUM CHLORIDE 20 MEQ/15ML (10%) PO LIQD
40.0000 meq | ORAL | Status: DC
  Filled 2014-03-23 (×2): qty 30

## 2014-03-23 MED ORDER — ZOLPIDEM TARTRATE 5 MG PO TABS
5.0000 mg | ORAL_TABLET | Freq: Once | ORAL | Status: AC
  Administered 2014-03-23: 5 mg via ORAL
  Filled 2014-03-23: qty 1

## 2014-03-23 MED ORDER — HEPARIN SODIUM (PORCINE) 5000 UNIT/ML IJ SOLN
5000.0000 [IU] | Freq: Three times a day (TID) | INTRAMUSCULAR | Status: DC
Start: 2014-03-23 — End: 2014-03-25
  Administered 2014-03-23 – 2014-03-25 (×8): 5000 [IU] via SUBCUTANEOUS
  Filled 2014-03-23 (×10): qty 1

## 2014-03-23 MED ORDER — CETYLPYRIDINIUM CHLORIDE 0.05 % MT LIQD
7.0000 mL | Freq: Two times a day (BID) | OROMUCOSAL | Status: DC
  Administered 2014-03-23 – 2014-03-25 (×4): 7 mL via OROMUCOSAL

## 2014-03-23 MED ORDER — SODIUM CHLORIDE 0.9 % IJ SOLN
3.0000 mL | Freq: Two times a day (BID) | INTRAMUSCULAR | Status: DC
  Administered 2014-03-23 – 2014-03-25 (×3): 3 mL via INTRAVENOUS

## 2014-03-23 MED ORDER — FLUOXETINE HCL 20 MG PO CAPS
40.0000 mg | ORAL_CAPSULE | Freq: Every day | ORAL | Status: DC
  Administered 2014-03-23 – 2014-03-25 (×3): 40 mg via ORAL
  Filled 2014-03-23 (×3): qty 2

## 2014-03-23 MED ORDER — FLUTICASONE PROPIONATE 50 MCG/ACT NA SUSP
2.0000 | Freq: Every day | NASAL | Status: DC
  Administered 2014-03-23 – 2014-03-25 (×3): 2 via NASAL
  Filled 2014-03-23: qty 16

## 2014-03-23 MED ORDER — BISACODYL 10 MG RE SUPP
10.0000 mg | Freq: Every day | RECTAL | Status: DC | PRN
  Administered 2014-03-24: 10 mg via RECTAL
  Filled 2014-03-23 (×2): qty 1

## 2014-03-23 MED ORDER — PANTOPRAZOLE SODIUM 40 MG IV SOLR
40.0000 mg | INTRAVENOUS | Status: DC
Start: 2014-03-23 — End: 2014-03-24
  Administered 2014-03-23 – 2014-03-24 (×2): 40 mg via INTRAVENOUS
  Filled 2014-03-23 (×3): qty 40

## 2014-03-23 MED ORDER — TRIAMCINOLONE ACETONIDE 55 MCG/ACT NA AERO
1.0000 | INHALATION_SPRAY | Freq: Two times a day (BID) | NASAL | Status: DC
Start: 2014-03-23 — End: 2014-03-23

## 2014-03-23 MED ORDER — POTASSIUM CHLORIDE 10 MEQ/100ML IV SOLN
10.0000 meq | INTRAVENOUS | Status: AC
  Administered 2014-03-23 (×4): 10 meq via INTRAVENOUS
  Filled 2014-03-23 (×4): qty 100

## 2014-03-23 MED ORDER — ONDANSETRON HCL 4 MG PO TABS: 4.0000 mg | ORAL_TABLET | Freq: Four times a day (QID) | ORAL | Status: DC | PRN

## 2014-03-23 MED ORDER — KETOROLAC TROMETHAMINE 30 MG/ML IJ SOLN
30.0000 mg | Freq: Four times a day (QID) | INTRAMUSCULAR | Status: DC | PRN
  Administered 2014-03-23 – 2014-03-25 (×5): 30 mg via INTRAVENOUS
  Filled 2014-03-23 (×5): qty 1

## 2014-03-23 MED ORDER — PANTOPRAZOLE SODIUM 40 MG PO TBEC
40.0000 mg | DELAYED_RELEASE_TABLET | Freq: Every day | ORAL | Status: DC
  Filled 2014-03-23: qty 1

## 2014-03-23 MED ORDER — DEXTROSE-NACL 5-0.45 % IV SOLN
INTRAVENOUS | Status: DC
  Administered 2014-03-23: 02:00:00 via INTRAVENOUS

## 2014-03-23 NOTE — H&P (Addendum)
Patient Demographics  Heather [name], is a 73 y.o. female  MRN: [id number]   DOB - [birth year]  Admit Date - 03/22/2014  Outpatient Primary MD for the patient is [name], MD   With History of -  Past Medical History  Diagnosis Date  . Depression     Controlled with SSRI  . HTN (hypertension)   . GERD (gastroesophageal reflux disease)     Chronic PPI's  . S/P dilatation of esophageal stricture     7/12 Dr Carlean Purl. Dilation of tight stricture proximal eso.  Prior dilation 09 and 08.  . Seasonal allergies     Zyrtec D PRN  . NASH (nonalcoholic steatohepatitis)     Per [place] 10/10  . History of PSVT (paroxysmal supraventricular tachycardia)     Remote and self limited.  . Gout     No crystal dx. R podegra x 3 episodes around 2010. Did not take the allopurinol that was rec 2011.  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 Chronic venous insufficiency     Present for years. Norvasc D/C'd 2012 thinking it might be contributing.  . Migraines   . Glaucoma   . Neuropathy   . Back pain   . Muscle weakness   . Polymyositis   . 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 Cholecystectomy    . Tubal ligation    . Laparoscopic burch procedure  04/02/07    Burch colposuspension. Inadvertant bladder damage - bladder repair, left ureteral stent placement, .  . Knee arthroscopy    . Abdominal hysterectomy  1980    2/2 menorrhagia  . Colonoscopy  09/28/2006    diverticulosis  . 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 dyspmotility of esophagus  . Bladder repair    .  Eye surgery      cataracts, glaucoma  . Muscle biopsy Right 11/18/2012    Procedure: right vastus lateralis muscle biopsy ;  Surgeon: Haywood Lasso, MD;  Location: [facility];  Service: General;  Laterality: Right;  . Esophagogastroduodenoscopy N/A 05/06/2013    Procedure: ESOPHAGOGASTRODUODENOSCOPY (EGD) Probable Venia Minks.;  Surgeon: Jerene Bears, MD;  Location: [facility];  Service: Gastroenterology;  Laterality: N/A;    in for   Chief Complaint  Patient presents with  . Fall  . Laceration     HPI  Heather [name]  is a 73 y.o. female, with known history of inclusion body myositis, with known generalized weakness progressing over a few months, where she ambulates at baseline with a walker, and for longer distance she needs wheelchair ,as well progressive dysphagia, with known history of esophageal stenosis, as well to do to myositis, where PEG tube plans were discussed with neurology, patient presents with fall,  reports its mechanical fall, as she was mopping the floor, she felt backward where she hit her head, she denies any loss of consciousness, altered mental status, focal deficits, CT head only showed a scalp hematoma, a patient did not have any significant lab abnormalities, urine analysis was negative, denies any cough or progressive sputum, and when patient is being planned to be discharged from the emergency room, while she was sitting on the wheelchair, she had presyncopal episode, where she kind of slumped on the chair for a few seconds, there was no urine or stool incontinence, no loss of consciousness, so hospitalist requested to admit the patient for further evaluation, patient denies any chest pain, shortness of breath, hemoptysis, focal deficits.    Review of Systems    In addition to the HPI above,  No Fever-chills, No Headache, No changes with Vision or hearing, Dysphagia for swallowing food or Liquids, No Chest pain, Cough or Shortness of Breath, No Abdominal pain,  No Nausea or Vommitting, Bowel movements are regular, No Blood in stool or Urine, No dysuria, No new skin rashes or bruises, No new joints pains-aches,  No new , tingling, numbness in any extremity, but does have generalized muscle weakness secondary to her myositis No recent weight gain or loss, No polyuria, polydypsia or polyphagia, No significant Mental Stressors.  A full 10 point Review of Systems was done, except as stated above, all other Review of Systems were negative.   Social History History  Substance Use Topics  . Smoking status: Former Smoker    Types: Cigarettes    Quit date: 08/07/1968  . Smokeless tobacco: Never Used  . Alcohol Use: No     Family History Family History  Problem Relation Age of Onset  . Heart disease Mother   . Hypertension Mother   . Heart attack Mother   . Heart disease Father   . Hypertension Father   . Stroke Father   . Heart disease Sister   . Hypertension Sister   . Throat cancer Brother   . Hypertension Brother   . Heart disease Brother   . Colon cancer Neg Hx      Prior to Admission medications   Medication Sig Start Date End Date Taking? Authorizing Provider  amLODipine (NORVASC) 5 MG tablet Take 1 tablet (5 mg total) by mouth daily. 03/10/13  Yes Jessee Avers, MD  Cetirizine HCl 10 MG CAPS Take 1 capsule (10 mg total) by mouth daily. 01/17/11  Yes Riddhishkumar Manuella Ghazi, MD  chlorpheniramine (CHLOR-TRIMETON) 4 MG tablet Take 1 tablet (4 mg total) by mouth 2 (two) times daily as needed for allergies. 10/20/13  Yes Valaria Good, MD  cloNIDine (CATAPRES) 0.1 MG tablet Take 1 tablet (0.1 mg total) by mouth 2 (two) times daily. 03/10/13  Yes Jessee Avers, MD  esomeprazole (NEXIUM) 40 MG capsule Take 1 capsule (40 mg total) by mouth daily before breakfast. 07/14/13  Yes Valaria Good, MD  FLUoxetine (PROZAC) 40 MG capsule Take 40 mg by mouth daily.  01/29/14  Yes Historical Provider, MD  HYDROcodone-acetaminophen (NORCO/VICODIN)  5-325 MG per tablet Take 1 tablet by mouth every 6 (six) hours as needed for pain. 03/10/13  Yes Jessee Avers, MD  latanoprost (XALATAN) 0.005 % ophthalmic solution Place 1 drop into the left eye at bedtime.  01/19/14  Yes Historical Provider, MD  metoprolol (LOPRESSOR) 100 MG tablet Take 100 mg by mouth 2 (two) times daily.   Yes Historical Provider, MD  Multiple Vitamin (MULTIVITAMIN) capsule  Take 1 capsule by mouth daily. 03/10/13  Yes Jessee Avers, MD  ondansetron (ZOFRAN ODT) 4 MG disintegrating tablet Take 1 tab every 6-8 hours as needed for nausea. 12/19/13  Yes Amy S Esterwood, PA-C  pantoprazole (PROTONIX) 40 MG tablet Take 1 tablet (40 mg total) by mouth daily. 12/19/13  Yes Amy S Esterwood, PA-C  spironolactone (ALDACTONE) 50 MG tablet Take 0.5 tablets (25 mg total) by mouth daily. 03/10/13  Yes Jessee Avers, MD  triamcinolone (NASACORT AQ) 55 MCG/ACT AERO nasal inhaler Place 1 spray into the nose 2 (two) times daily. 10/20/13 10/20/14 Yes Valaria Good, MD    Allergies  Allergen Reactions  . Propoxyphene Napsylate Nausea Only  . Darvon   . Latex     Per Patient latex=rash "hands look burned"    Physical Exam  Vitals  Blood pressure 121/64, pulse 84, temperature 98.4 F (36.9 C), temperature source Oral, resp. rate 18, last menstrual period 11/08/1978, SpO2 98.00%.   1. General frail elderly female lying in bed in NAD,   2. Normal affect and insight, Not Suicidal or Homicidal, Awake Alert, Oriented X 3.  3. No F.N deficits, ALL C.Nerves Intact, Strength 3-4/5 all 4 extremities, more pronounced in the proximal than distal, Sensation intact all 4 extremities, Plantars down going.  4. Ears and Eyes appear Normal, Conjunctivae clear, PERRLA. Moist Oral Mucosa.  5. Supple Neck, No JVD, No cervical lymphadenopathy appriciated, No Carotid Bruits.  6. Symmetrical Chest wall movement, Good air movement bilaterally, CTAB.  7. RRR, No Gallops, Rubs or Murmurs, No Parasternal  Heave.  8. Positive Bowel Sounds, Abdomen Soft, No tenderness, No organomegaly appriciated,No rebound -guarding or rigidity.  9.  No Cyanosis, Normal Skin Turgor, No Skin Rash or Bruise.  10. Good muscle tone,  joints appear normal , no effusions, Normal ROM.  11. No Palpable Lymph Nodes in Neck or Axillae    Data Review  CBC  Recent Labs Lab 03/22/14 2049  WBC 12.4*  HGB 12.8  HCT 35.3*  PLT PLATELET CLUMPS NOTED ON SMEAR, COUNT APPEARS ADEQUATE  MCV 94.9  MCH 34.4*  MCHC 36.3*  RDW 12.5  LYMPHSABS 4.0  MONOABS 0.5  EOSABS 0.0  BASOSABS 0.0   ------------------------------------------------------------------------------------------------------------------  Chemistries   Recent Labs Lab 03/22/14 2049  NA 131*  K 3.6*  CL 90*  CO2 28  GLUCOSE 139*  BUN 9  CREATININE 0.45*  CALCIUM 10.2  AST 34  ALT 34  ALKPHOS 74  BILITOT 0.3   ------------------------------------------------------------------------------------------------------------------ CrCl is unknown because both a height and weight (above a minimum accepted value) are required for this calculation. ------------------------------------------------------------------------------------------------------------------   Coagulation profile No results found for this basename: INR, PROTIME,  in the last 168 hours ------------------------------------------------------------------------------------------------------------------- No results found for this basename: DDIMER,  in the last 72 hours  -------------------------------------------------------------------------------------------------------------------  Cardiac Enzymes No results found for this basename: CK, CKMB, TROPONINI, MYOGLOBIN,  in the last 168 hours ------------------------------------------------------------------------------------------------------------------ No components found with this basename: POCBNP,     ---------------------------------------------------------------------------------------------------------------  Urinalysis    Component Value Date/Time   COLORURINE YELLOW 03/22/2014 2112   APPEARANCEUR CLEAR 03/22/2014 2112   LABSPEC 1.005 03/22/2014 2112   PHURINE 7.0 03/22/2014 2112   [place] 03/22/2014 2112   GLUCOSEU NEG mg/dL 06/04/2006 0805   HGBUR NEGATIVE 03/22/2014 2112   HGBUR trace-intact 10/12/2009 [place] 03/22/2014 2112   [place] Negative 10/20/2013 [place] 03/22/2014 2112   [place] 03/22/2014 2112  PROTEINUR Negative 10/20/2013 1608   UROBILINOGEN 0.2 03/22/2014 2112   UROBILINOGEN 1.0 10/20/2013 1608   NITRITE NEGATIVE 03/22/2014 2112   NITRITE Positive 10/20/2013 [place] 03/22/2014 2112    ----------------------------------------------------------------------------------------------------------------  Imaging results:   Dg Pelvis 1-2 Views  03/22/2014   CLINICAL DATA:  Fall today with low back and posterior pelvic pain.  EXAM: PELVIS - 1-2 VIEW  COMPARISON:  12/17/2011 lumbar spine radiographs.  FINDINGS: Both femoral heads are located. Osteoarthritis involves both hips mildly. Sacroiliac joints are symmetric. No acute fracture. Surgical staples in the pelvis.  IMPRESSION: No acute osseous abnormality.   Electronically Signed   By: Abigail Miyamoto M.D.   On: 03/22/2014 18:10   Ct Head Wo Contrast  03/22/2014   CLINICAL DATA:  [place].  Hit head.  EXAM: CT HEAD WITHOUT CONTRAST  CT CERVICAL SPINE WITHOUT CONTRAST  TECHNIQUE: Multidetector CT imaging of the head and cervical spine was performed following the standard protocol without intravenous contrast. Multiplanar CT image reconstructions of the cervical spine were also generated.  COMPARISON:  None.  FINDINGS: CT HEAD FINDINGS  The ventricles are normal in size and configuration. No extra-axial fluid collections are identified. The gray-white  differentiation is normal. No CT findings for acute intracranial process such as hemorrhage or infarction. No mass lesions. The brainstem and cerebellum are grossly normal.  There is a scalp hematoma in the left occipital region. No underlying skull fracture. The bony structures are intact. The paranasal sinuses and mastoid air cells are clear. The globes are intact.  CT CERVICAL SPINE FINDINGS  Moderate degenerative cervical spondylosis with multilevel disc disease and facet disease. No acute cervical spine fracture is identified. Moderate degenerative changes at C1-2. The C1-2 articulations are maintained. The dens is intact. No abnormal prevertebral soft tissue swelling. The facets are normally aligned. No facet or laminar fractures.  No large disc protrusions.  The lungs are grossly clear.  IMPRESSION: No acute intracranial findings or skull fracture.  Scalp hematoma in the left occipital region.  Degenerative cervical spondylosis but no acute cervical spine fracture.   Electronically Signed   By: Kalman Jewels M.D.   On: 03/22/2014 18:18   Ct Cervical Spine Wo Contrast  03/22/2014   CLINICAL DATA:  [place].  Hit head.  EXAM: CT HEAD WITHOUT CONTRAST  CT CERVICAL SPINE WITHOUT CONTRAST  TECHNIQUE: Multidetector CT imaging of the head and cervical spine was performed following the standard protocol without intravenous contrast. Multiplanar CT image reconstructions of the cervical spine were also generated.  COMPARISON:  None.  FINDINGS: CT HEAD FINDINGS  The ventricles are normal in size and configuration. No extra-axial fluid collections are identified. The gray-white differentiation is normal. No CT findings for acute intracranial process such as hemorrhage or infarction. No mass lesions. The brainstem and cerebellum are grossly normal.  There is a scalp hematoma in the left occipital region. No underlying skull fracture. The bony structures are intact. The paranasal sinuses and mastoid air cells are clear.  The globes are intact.  CT CERVICAL SPINE FINDINGS  Moderate degenerative cervical spondylosis with multilevel disc disease and facet disease. No acute cervical spine fracture is identified. Moderate degenerative changes at C1-2. The C1-2 articulations are maintained. The dens is intact. No abnormal prevertebral soft tissue swelling. The facets are normally aligned. No facet or laminar fractures.  No large disc protrusions.  The lungs are grossly clear.  IMPRESSION: No acute intracranial findings or skull fracture.  Scalp hematoma in the  left occipital region.  Degenerative cervical spondylosis but no acute cervical spine fracture.   Electronically Signed   By: Kalman Jewels M.D.   On: 03/22/2014 18:18        Assessment & Plan  Principal Problem:   Pre-syncope Active Problems:   GLAUCOMA NOS   HYPERTENSION   GERD   Other dysphagia   Inclusion body myositis   Weakness    1. Presyncope: This is most likely related to her generalized weakness from myositis, she will be observed on telemetry unit, will check carotid Doppler as well, and will hold her antihypertensive medication and his blood pressure was on the lower side, will check orthostatic blood pressure, and hydrate with IV fluids.  2. Generalized Weakness / inclusion body myositis: This is secondary to her progression of myositis, will consult physical therapy, and will have morning team consult neurology service at this point to see if there is any indication for steroids or  immunosuppressive therapy.  3. Dysphagia: This is related to her progressive myositis, and unclear if esophageal stricture contributing to it or not, would have swallow evaluation evaluate the patient in the morning, meanwhile she'll be kept n.p.o. , discussions for PEG tube was already carried by outside by outside neurologist with the patient.  4. Glaucoma: Continue with Xalatan  5. GERD: Continue with PPI  6. Hypertension: We'll hold all meds as blood  pressure is on the lower side  DVT Prophylaxis Heparin -  AM Labs Ordered, also please review Full Orders  Family Communication: Admission, patients condition and plan of care including tests being ordered have been discussed with the patient and daughter who indicate understanding and agree with the plan and Code Status.  Code Status full code  Likely DC to  home versus subacute rehabilitation  Condition GUARDED    Time spent in minutes : 55 minutes    Breydon Senters M.D on 03/23/2014 at 12:50 AM   [facility] Office  [phone]   **Disclaimer: This note may have been dictated with voice recognition software. Similar sounding words can inadvertently be transcribed and this note may contain transcription errors which may not have been corrected upon publication of note.**

## 2014-03-23 NOTE — Progress Notes (Signed)
Physical Therapy Treatment Patient Details Name: Heather [name] MRN: [id number] DOB: 1941/02/26 Today's Date: 03/23/2014    History of Present Illness Heather [name]  is a 72 y.o. female, admitted 03/22/14 after slumping over in  [facility] in ED, which she came to after a fall at home. Patient with known history of inclusion body myositis, with known generalized weakness progressing over a few months, where she ambulates at baseline with a walker, and for longer distance she needs wheelchair ,as well progressive dysphagia, with known history of esophageal stenosis, as well to do to myositis, where PEG tube plans were discussed with neurology.    PT Comments    Pt continues with very weak LEs  Follow Up Recommendations  SNF;Supervision/Assistance - 24 hour     Equipment Recommendations  None recommended by PT    Recommendations for Other Services OT consult     Precautions / Restrictions Precautions Precautions: Fall Precaution Comments: knees may buckle    Mobility  Bed Mobility            Transfers Overall transfer level: Needs assistance   Transfers: Sit to/from Stand;Stand Pivot Transfers Sit to Stand: +2 safety/equipment;+2 physical assistance;Max assist Stand pivot transfers: Mod assist;+2 safety/equipment;+2 physical assistance       General transfer comment: secure L knee to prevent buckling during pivot, pt did stand with 1 hand on RW and performed pericare.  Ambulation/Gait                 Stairs            Wheelchair Mobility    Modified Rankin (Stroke Patients Only)       Balance Overall balance assessment: Needs assistance;History of Falls Sitting-balance support: No upper extremity supported;Feet supported Sitting balance-Leahy Scale: Fair     Standing balance support: Bilateral upper extremity supported;During functional activity Standing balance-Leahy Scale: Poor                      Cognition  Arousal/Alertness: Awake/alert Behavior During Therapy: WFL for tasks assessed/performed Overall Cognitive Status: Within Functional Limits for tasks assessed                      Exercises      General Comments        Pertinent Vitals/Pain Pain Assessment: No/denies pain    Home Living Family/patient expects to be discharged to:: Private residence Living Arrangements: Alone Available Help at Discharge: [place] Type of Home: House Home Access: Ramped entrance   Home Layout: One level Home Equipment: Environmental consultant - 2 wheels;Wheelchair - manual Additional Comments: no family available    Prior Function Level of Independence: Independent with assistive device(s)          PT Goals (current goals can now be found in the care plan section) Acute Rehab PT Goals Patient Stated Goal: I need to get stronger and take care of myself. PT Goal Formulation: With patient Time For Goal Achievement: 04/06/14 Potential to Achieve Goals: Good Progress towards PT goals: Progressing toward goals    Frequency  Min 3X/week    PT Plan Current plan remains appropriate    Co-evaluation             End of Session   Activity Tolerance: Patient tolerated treatment well Patient left: in bed;with nursing/sitter in room     Time: 1004-1012 PT Time Calculation (min): 8 min  Charges:  $Therapeutic Activity: 8-22 mins  G Codes:      Heather [name] 03/23/2014, 10:50 AM

## 2014-03-23 NOTE — Progress Notes (Signed)
VASCULAR LAB PRELIMINARY  PRELIMINARY  PRELIMINARY  PRELIMINARY  Carotid duplex  completed.    Preliminary report:  Bilateral:  1-39% ICA stenosis.  Vertebral artery flow is antegrade.      Abrey Bradway, RVT 03/23/2014, 12:04 PM

## 2014-03-23 NOTE — Procedures (Addendum)
Objective Swallowing Evaluation: Modified Barium Swallowing Study  Patient Details  Name: Heather [name] MRN: [id number] Date of Birth: 10-Oct-1940  Today's Date: 03/23/2014 Time: 1330-1400 SLP Time Calculation (min): 30 min  Past Medical History:  Past Medical History  Diagnosis Date  . Depression     Controlled with SSRI  . HTN (hypertension)   . GERD (gastroesophageal reflux disease)     Chronic PPI's  . S/P dilatation of esophageal stricture     7/12 Dr Carlean Purl. Dilation of tight stricture proximal eso.  Prior dilation 09 and 08.  . Seasonal allergies     Zyrtec D PRN  . NASH (nonalcoholic steatohepatitis)     Per [place] 10/10  . History of PSVT (paroxysmal supraventricular tachycardia)     Remote and self limited.  . Gout     No crystal dx. R podegra x 3 episodes around 2010. Did not take the allopurinol that was rec 2011.  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 Chronic venous insufficiency     Present for years. Norvasc D/C'd 2012 thinking it might be contributing.  . Migraines   . Glaucoma   . Neuropathy   . Back pain   . Muscle weakness   . Polymyositis   . 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 Cholecystectomy    . Tubal ligation    . Laparoscopic burch procedure  04/02/07    Burch colposuspension. Inadvertant bladder damage - bladder repair, left ureteral stent placement, .  . Knee arthroscopy    . Abdominal hysterectomy  1980    2/2 menorrhagia  . Colonoscopy  09/28/2006    diverticulosis  . 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 dyspmotility of esophagus  . Bladder repair    . Eye surgery      cataracts, glaucoma  . Muscle biopsy Right 11/18/2012    Procedure: right vastus  lateralis muscle biopsy ;  Surgeon: Haywood Lasso, MD;  Location: [facility];  Service: General;  Laterality: Right;  . Esophagogastroduodenoscopy N/A 05/06/2013    Procedure: ESOPHAGOGASTRODUODENOSCOPY (EGD) Probable Venia Minks.;  Surgeon: Jerene Bears, MD;  Location: [facility];  Service: Gastroenterology;  Laterality: N/A;   HPI:  73 year old female admitted 03/22/14 due to weakness and fall. PMH significant for dysphagia secondary to myositis and esophageal stricture, GERD. BSE ordered to evaluate swallow function and safety as decisions are being made when/if to place PEG tube. After discussion with Dr. Grandville Silos and pt, it was decided to proceed with BSE     Assessment / Plan / Recommendation Clinical Impression  Dysphagia Diagnosis: Severe pharyngeal phase dysphagia Pt exhibits continued severe pharyngeal motor and sensory based dysphagia, similar in presentation to MBS report from October 2014. Still noted are nearly absent tongue base retraction and epiglottic deflection, poor laryngeal elevation, and reduced pharyngeal contraction.  The swallow is significantly delayed, with posterior spill to the pyriform sinus across consistencies. Post-swallow residue is seen on all consistencies tested, and was noted to increase in amount as the thickness increased. Pt able to take liquid wash of water and clear vallecular and pyriform residue.  Of note, NO penetration or aspiration was observed with any consistency tested, despite bolus size or rate. At this time, full liquid diet is recommended for  pt, with thin, nectar, and honey thick liquid consistencies allowable. Decisions are pending regarding placement of PEG tube. It is the opinion of this SLP. after discussion with pt following MBS, that PEG placement would provide her with adequate nutrition and hydration, allowing po intake to be purely oral satisfaction.  Pt indicated she wanted fried chicken - Pt was encouraged when SLP explained how she could have  anything she desires, as long as it has been processed thoroughly in a blender, and thinned to either a honey, nectar, or thin consistency using an appropriate medium (milk, butter, water, sauce, etc). SLP provided several examples of how this has been done for a similar situation.  Home health ST is strongly recommended to allow pt to have therapy in her home environment for education re: how to achieve the proper consistency.    Treatment Recommendation  Therapy as outlined in treatment plan below    Diet Recommendation Honey-thick liquid;Nectar-thick liquid;Thin liquid (full liquid)   Liquid Administration via: Spoon;Cup;Straw Medication Administration: Via alternative means Supervision: Patient able to self feed Compensations: Slow rate;Small sips/bites Postural Changes and/or Swallow Maneuvers: Seated upright 90 degrees    Other  Recommendations Recommended Consults: MBS Oral Care Recommendations: Oral care BID   Follow Up Recommendations  Home health SLP    Frequency and Duration min 1 x/week  1 week   Pertinent Vitals/Pain VSS, headache reported    SLP Swallow Goals   see care plan   General Date of Onset: 03/22/14 HPI: 73 year old female admitted 03/22/14 due to weakness and fall. PMH significant for dysphagia secondary to myositis and esophageal stricture, GERD. BSE ordered to evaluate swallow function and safety as decisions are being made when/if to place PEG tube. After discussion with Dr. Grandville Silos and pt, it was decided to proceed with BSE Type of Study: Modified Barium Swallowing Study Reason for Referral: Objectively evaluate swallowing function Previous Swallow Assessment: MBS 05/2013 revealed severe pharyngeal and severe esophageal dysphagia. Diet Prior to this Study: Thin liquids Temperature Spikes Noted: No Respiratory Status: Room air History of Recent Intubation: No Behavior/Cognition: Alert;Pleasant mood;Cooperative Oral Cavity - Dentition: Adequate natural  dentition Oral Motor / Sensory Function: Within functional limits Self-Feeding Abilities: Able to feed self Patient Positioning: Upright in chair Baseline Vocal Quality: Wet Volitional Cough: Weak Volitional Swallow: Unable to elicit Anatomy: Within functional limits Pharyngeal Secretions: Not observed secondary MBS    Reason for Referral Objectively evaluate swallowing function   Oral Phase Oral Preparation/Oral Phase Oral Phase: WFL   Pharyngeal Phase Pharyngeal Phase Pharyngeal Phase: Impaired Pharyngeal - Honey Pharyngeal - Honey Teaspoon: Delayed swallow initiation;Premature spillage to valleculae;Premature spillage to pyriform sinuses;Reduced pharyngeal peristalsis;Reduced epiglottic inversion;Reduced anterior laryngeal mobility;Reduced laryngeal elevation;Reduced tongue base retraction;Pharyngeal residue - valleculae;Pharyngeal residue - pyriform sinuses Pharyngeal - Nectar Pharyngeal - Nectar Cup: Delayed swallow initiation;Premature spillage to valleculae;Premature spillage to pyriform sinuses;Reduced laryngeal elevation;Reduced anterior laryngeal mobility;Reduced pharyngeal peristalsis;Reduced epiglottic inversion;Reduced tongue base retraction;Pharyngeal residue - valleculae;Pharyngeal residue - pyriform sinuses Pharyngeal - Nectar Straw: Reduced pharyngeal peristalsis;Reduced tongue base retraction;Reduced epiglottic inversion;Delayed swallow initiation;Reduced anterior laryngeal mobility;Premature spillage to valleculae;Reduced laryngeal elevation;Premature spillage to pyriform sinuses;Pharyngeal residue - pyriform sinuses;Pharyngeal residue - valleculae Pharyngeal - Thin Pharyngeal - Thin Teaspoon: Delayed swallow initiation;Premature spillage to valleculae;Premature spillage to pyriform sinuses;Reduced laryngeal elevation;Reduced anterior laryngeal mobility;Reduced epiglottic inversion;Reduced pharyngeal peristalsis;Reduced tongue base retraction;Pharyngeal residue -  valleculae;Pharyngeal residue - pyriform sinuses Pharyngeal - Thin Cup: Delayed swallow initiation;Premature spillage to valleculae;Premature spillage to pyriform sinuses;Reduced laryngeal elevation;Reduced anterior laryngeal mobility;Reduced  epiglottic inversion;Reduced pharyngeal peristalsis;Reduced tongue base retraction;Pharyngeal residue - valleculae;Pharyngeal residue - pyriform sinuses Pharyngeal - Thin Straw: Delayed swallow initiation;Premature spillage to valleculae;Premature spillage to pyriform sinuses;Reduced laryngeal elevation;Pharyngeal residue - pyriform sinuses;Pharyngeal residue - valleculae;Reduced anterior laryngeal mobility;Reduced epiglottic inversion;Reduced pharyngeal peristalsis;Reduced tongue base retraction  Cervical Esophageal Phase    GO   Travis Purk B. Quentin Ore [facility], CCC-SLP [phone] Cervical Esophageal Phase Cervical Esophageal Phase:  (not visualized)         Shonna Chock 03/23/2014, 3:01 PM

## 2014-03-23 NOTE — Evaluation (Signed)
Clinical/Bedside Swallow Evaluation Patient Details  Name: KHALIYA GOLINSKI MRN: [id number] Date of Birth: 04-Apr-1941  Today's Date: 03/23/2014 Time: 1200-1245 SLP Time Calculation (min): 45 min  Past Medical History:  Past Medical History  Diagnosis Date  . Depression     Controlled with SSRI  . HTN (hypertension)   . GERD (gastroesophageal reflux disease)     Chronic PPI's  . S/P dilatation of esophageal stricture     7/12 Dr Carlean Purl. Dilation of tight stricture proximal eso.  Prior dilation 09 and 08.  . Seasonal allergies     Zyrtec D PRN  . NASH (nonalcoholic steatohepatitis)     Per [place] 10/10  . History of PSVT (paroxysmal supraventricular tachycardia)     Remote and self limited.  . Gout     No crystal dx. R podegra x 3 episodes around 2010. Did not take the allopurinol that was rec 2011.  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 Chronic venous insufficiency     Present for years. Norvasc D/C'd 2012 thinking it might be contributing.  . Migraines   . Glaucoma   . Neuropathy   . Back pain   . Muscle weakness   . Polymyositis   . 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 Cholecystectomy    . Tubal ligation    . Laparoscopic burch procedure  04/02/07    Burch colposuspension. Inadvertant bladder damage - bladder repair, left ureteral stent placement, .  . Knee arthroscopy    . Abdominal hysterectomy  1980    2/2 menorrhagia  . Colonoscopy  09/28/2006    diverticulosis  . 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 dyspmotility of esophagus  . Bladder repair    . Eye surgery      cataracts, glaucoma  . Muscle biopsy Right 11/18/2012    Procedure: right vastus lateralis muscle biopsy ;  Surgeon:  Haywood Lasso, MD;  Location: [facility];  Service: General;  Laterality: Right;  . Esophagogastroduodenoscopy N/A 05/06/2013    Procedure: ESOPHAGOGASTRODUODENOSCOPY (EGD) Probable Venia Minks.;  Surgeon: Jerene Bears, MD;  Location: [facility];  Service: Gastroenterology;  Laterality: N/A;   HPI:  73 year old female admitted 03/22/14 due to weakness and fall. PMH significant for dysphagia secondary to myositis and esophageal stricture, GERD. BSE ordered to evaluate swallow function and safety as decisions are being made when/if to place PEG tube.   Assessment / Plan / Recommendation Clinical Impression  No po trials were presented during this assessment, given pt's high aspiration risk. Pt underwent MBS October 2014, which revealed severe pharyngoesophageal dysphagia, with "nearly absent tongue base retraction and poor pharyngeal contraction resulting in gross pharyngeal stasis". Given length of time since last MBS and anticipated progression of pt disease process, it is felt that pt would benefit best from repetition of the objective study. SLP discussed pt prior MBS results with pt and MD, and anticipate today's study will reveal no improvement and possible deterioration of swallow function. Per Dr. Grandville Silos, decisions regarding if/when to place PEG tube are pending, and information gleaned from [facility] will be helpful.    Aspiration Risk  Severe    Diet Recommendation  (pending MBS results)        Other  Recommendations Recommended Consults: MBS  Follow Up Recommendations   (TBD)    Frequency and Duration        Pertinent Vitals/Pain VSS, no pain reported    SLP Swallow Goals  to be determined based on MBS results. Study scheduled for 1330 this date.   Swallow Study Prior Functional Status  Type of Home: House Available Help at Discharge: [place] Date of Onset: 03/22/14 HPI: 73 year old female admitted 03/22/14 due to weakness and fall. PMH significant  for dysphagia secondary to myositis and esophageal stricture, GERD. BSE ordered to evaluate swallow function and safety as decisions are being made when/if to place PEG tube. Type of Study: Bedside swallow evaluation Previous Swallow Assessment: MBS 05/2013 revealed severe pharyngeal and severe esophageal dysphagia. Diet Prior to this Study: Thin liquids (clear liquid) Temperature Spikes Noted: No Respiratory Status: Room air History of Recent Intubation: No Behavior/Cognition: Alert;Pleasant mood;Cooperative Oral Cavity - Dentition: Adequate natural dentition Self-Feeding Abilities: Able to feed self Patient Positioning: Partially reclined Baseline Vocal Quality: Wet Volitional Cough: Weak Volitional Swallow: Unable to elicit    Oral/Motor/Sensory Function Overall Oral Motor/Sensory Function: Appears within functional limits for tasks assessed   Ice Chips Ice chips: Not tested   Thin Liquid Thin Liquid: Not tested    Nectar Thick Nectar Thick Liquid: Not tested   Honey Thick Honey Thick Liquid: Not tested   Puree Puree: Not tested   Solid   GO   Celia B. Quentin Ore [facility], CCC-SLP [phone] Solid: Not tested       Shonna Chock 03/23/2014,12:59 PM

## 2014-03-23 NOTE — Progress Notes (Signed)
Clinical Social Work  CSW went to room in order to meet with patient to talk about DC planning. Patient out of room. CSW will follow up at later time.  [place], [place] [phone]

## 2014-03-23 NOTE — Progress Notes (Signed)
Clinical Social Work Department CLINICAL SOCIAL WORK PLACEMENT NOTE 03/23/2014  Patient:  Heather [name], Heather [name]  Account Number:  [id number] Admit date:  03/22/2014  Clinical Social Worker:  Sindy Messing, LCSW  Date/time:  03/23/2014 04:00 PM  Clinical Social Work is seeking post-discharge placement for this patient at the following level of care:   [place]   (*CSW will update this form in Epic as items are completed)   03/23/2014  Patient/family provided with [place] Department of Clinical Social Work's list of facilities offering this level of care within the geographic area requested by the patient (or if unable, by the patient's family).  03/23/2014  Patient/family informed of their freedom to choose among providers that offer the needed level of care, that participate in Medicare, Medicaid or managed care program needed by the patient, have an available bed and are willing to accept the patient.  03/23/2014  Patient/family informed of [facility]' ownership interest in [facility], as well as of the fact that they are under no obligation to receive care at this facility.  PASARR submitted to EDS on 03/23/2014 PASARR number received on 03/23/2014  FL2 transmitted to all facilities in geographic area requested by pt/family on  03/23/2014 FL2 transmitted to all facilities within larger geographic area on   Patient informed that his/her managed care company has contracts with or will negotiate with  certain facilities, including the following:     Patient/family informed of bed offers received:   Patient chooses bed at  Physician recommends and patient chooses bed at    Patient to be transferred to  on   Patient to be transferred to facility by  Patient and family notified of transfer on  Name of family member notified:    The following physician request were entered in Epic:   Additional Comments:

## 2014-03-23 NOTE — Progress Notes (Signed)
Pt. Arrived in room 1508 @ 0115 via stretcher accompanied by family members and a nurse from ED. Pt. Is A/V/Ox4,can make needs known. Resp. Even and unlabored. Skin feels little bit clammy, but is intact. No apparent distress noted. Pt. Complained of headache(8/10), medicated with PRn pain med with relief. On ECG  Monitoring. Pt. Oriented to surroundings, call light within reach. Will cont. To monitor.

## 2014-03-23 NOTE — Progress Notes (Signed)
I have seen and assessed patient and agree with Dr Elgergawy's assessment and plan. Will consult with neurology, and ST. Likely needs PEG tube placement however was been considered as outpatient.

## 2014-03-23 NOTE — ED Notes (Signed)
Called lab and spoke to wendy. Abigail Butts said CK could be added. Sent add on to lab.

## 2014-03-23 NOTE — Evaluation (Addendum)
Physical Therapy Evaluation Patient Details Name: Heather [name] MRN: [id number] DOB: 22-Jun-1941 Today's Date: 03/23/2014   History of Present Illness  Heather [name]  is a 73 y.o. female, admitted 03/22/14 after slumping over in  [facility] in ED, which she came to after a fall at home. Patient with known history of inclusion body myositis, with known generalized weakness progressing over a few months, where she ambulates at baseline with a walker, and for longer distance she needs wheelchair ,as well progressive dysphagia, with known history of esophageal stenosis  Clinical Impression   Patient presents with very weak quad strength, decreased ability to safely ambulate. Pt will benefit from PT to address problems listed in note. Recommend OT consult.Orthostatics taken. See VS below   Follow Up Recommendations SNF;Supervision/Assistance - 24 hour    Equipment Recommendations  None recommended by PT    Recommendations for Other Services OT consult     Precautions / Restrictions Precautions Precautions: Fall Precaution Comments: knees may buckle      Mobility  Bed Mobility Overal bed mobility: Needs Assistance Bed Mobility: Supine to Sit     Supine to sit: Min assist;HOB elevated     General bed mobility comments: extra time to move to sitting, able to get trunk upright as pt long sitting in the bed  Transfers Overall transfer level: Needs assistance   Transfers: Sit to/from Stand;Stand Pivot Transfers Sit to Stand: +2 safety/equipment;+2 physical assistance;Max assist Stand pivot transfers: Max assist;+2 safety/equipment;+2 physical assistance       General transfer comment: lifting assist  to stand from bed, recliner and BSC, , decreased control of descent , drops to surface if not assisted. Noted knees are "locked" -hyperextended in standing and pivoting  Ambulation/Gait                Stairs            Wheelchair Mobility    Modified Rankin (Stroke  Patients Only)       Balance Overall balance assessment: Needs assistance;History of Falls Sitting-balance support: No upper extremity supported;Feet supported Sitting balance-Leahy Scale: Fair     Standing balance support: Bilateral upper extremity supported;During functional activity Standing balance-Leahy Scale: Poor                               Pertinent Vitals/Pain Pain Assessment: No/denies pain Orthostatics supine 129/67 Sitting 126/64 Stand/pivot 103/55 After rest w/ elevated 112/60. RN aware.     Home Living Family/patient expects to be discharged to:: Private residence Living Arrangements: Alone Available Help at Discharge: [place] Type of Home: House Home Access: [place]: [address] [place]: Environmental consultant - 2 wheels;Wheelchair - manual Additional Comments: no family available    Prior Function Level of Independence: Independent with assistive device(s)               Hand Dominance        Extremity/Trunk Assessment   Upper Extremity Assessment: Overall WFL for tasks assessed           Lower Extremity Assessment: RLE deficits/detail;LLE deficits/detail RLE Deficits / Details: quad strength is 3+,  LLE Deficits / Details: quads=2/5  Cervical / Trunk Assessment: Normal  Communication   Communication: No difficulties  Cognition Arousal/Alertness: Awake/alert Behavior During Therapy: WFL for tasks assessed/performed Overall Cognitive Status: Within Functional Limits for tasks assessed  General Comments      Exercises        Assessment/Plan    PT Assessment Patient needs continued PT services  PT Diagnosis Difficulty walking;Generalized weakness   PT Problem List Decreased strength;Decreased activity tolerance;Decreased mobility;Decreased knowledge of use of DME;Decreased safety awareness;Decreased knowledge of precautions;Impaired sensation  PT  Treatment Interventions DME instruction;Gait training;Functional mobility training;Therapeutic exercise;Therapeutic activities;Patient/family education   PT Goals (Current goals can be found in the Care Plan section) Acute Rehab PT Goals Patient Stated Goal: I need to get stronger and take care of myself. PT Goal Formulation: With patient Time For Goal Achievement: 04/06/14 Potential to Achieve Goals: Good    Frequency Min 3X/week   Barriers to discharge Decreased caregiver support      Co-evaluation               End of Session   Activity Tolerance: Patient tolerated treatment well;Patient limited by fatigue Patient left: in chair;with call bell/phone within reach Nurse Communication: Mobility status         Time: 0852-0925 PT Time Calculation (min): 33 min   Charges:   PT Evaluation $Initial PT Evaluation Tier I: 1 Procedure PT Treatments $Therapeutic Activity: 23-37 mins   PT G Codes:          Claretha Cooper 03/23/2014, 10:39 AM Tresa Endo PT [phone]

## 2014-03-23 NOTE — Progress Notes (Signed)
INITIAL NUTRITION ASSESSMENT  DOCUMENTATION CODES Per approved criteria  -Not Applicable   INTERVENTION: - Boost Plus TID - Encouraged excellent meal intake - RD to continue to monitor  NUTRITION DIAGNOSIS: Swallowing difficulty related to myositis and esophageal stricture as evidenced by SLP evaluation.    Goal: Pt to consume >90% of meals/supplements  Monitor:  Weights, labs, intake  Reason for Assessment: Malnutrition screening tool   73 y.o. female  Admitting Dx: Pre-syncope  ASSESSMENT: Pt with known history of inclusion body myositis, with known generalized weakness progressing over a few months, where she ambulates at baseline with a walker, and for longer distance she needs wheelchair ,as well progressive dysphagia, with known history of esophageal stenosis, as well to do to myositis, patient presents with fall, reports mechanical fall, as she was mopping the floor, she felt backward where she hit her head. Found to have a scalp hematoma.   - SLP following with recommendations for full liquid diet - Met with pt who reports PO diet at home was 2 Boost per day with some creamed soups and yogurt. Said she couldn't tolerate yogurt that was too thick. Surprisingly could eat cheese doodles after she let them dissolve in her mouth.  - Reports losing 29 pounds unintentionally in the past 8 months - Weight trend shows weight down 13 pounds in the past 5 months  Potassium low, getting IV replacement  Magnesium WNL  Nutrition Focused Physical Exam:  Subcutaneous Fat:  Orbital Region: wnl Upper Arm Region: mild fat loss  Thoracic and Lumbar Region: wnl  Muscle:  Temple Region: wnl Clavicle Bone Region: wnl Clavicle and Acromion Bone Region: wnl Scapular Bone Region: wnl  Dorsal Hand: wnl Patellar Region: wnl Anterior Thigh Region: NA Posterior Calf Region: NA - wearing SCDs  Edema: None noted   Height: Ht Readings from Last 1 Encounters:  03/23/14 5\' 10"  (1.778  m)    Weight: Wt Readings from Last 1 Encounters:  03/23/14 181 lb (82.1 kg)    Ideal Body Weight: 166 lbs   % Ideal Body Weight: 109%  Wt Readings from Last 10 Encounters:  03/23/14 181 lb (82.1 kg)  12/30/13 186 lb (84.369 kg)  12/19/13 188 lb 4 oz (85.39 kg)  10/20/13 194 lb 8 oz (88.225 kg)  07/14/13 193 lb 4.8 oz (87.68 kg)  05/08/13 189 lb 2 oz (85.787 kg)  05/06/13 191 lb (86.637 kg)  04/28/13 198 lb 3.2 oz (89.903 kg)  03/10/13 201 lb (91.173 kg)  01/29/13 200 lb (90.719 kg)    Usual Body Weight: 210 lbs 8 months ago per pt  % Usual Body Weight: 86%  BMI:  Body mass index is 25.97 kg/(m^2).  Estimated Nutritional Needs: Kcal: 1800-2000 Protein: 85-105g Fluid: per MD  Skin: Intact   Diet Order: Full Liquid  EDUCATION NEEDS: -No education needs identified at this time   Intake/Output Summary (Last 24 hours) at 03/23/14 1549 Last data filed at 03/23/14 1100  Gross per 24 hour  Intake      0 ml  Output   1900 ml  Net  -1900 ml    Last BM: 8/16  Labs:   Recent Labs Lab 03/22/14 2049 03/23/14 0500  NA 131* 134*  K 3.6* 3.2*  CL 90* 97  CO2 28 27  BUN 9 8  CREATININE 0.45* 0.40*  CALCIUM 10.2 9.0  MG  --  1.8  GLUCOSE 139* 111*    CBG (last 3)  No results found for this basename:  GLUCAP,  in the last 72 hours  Scheduled Meds: . antiseptic oral rinse  7 mL Mouth Rinse BID  . FLUoxetine  40 mg Oral Daily  . fluticasone  2 spray Each Nare Daily  . heparin  5,000 Units Subcutaneous 3 times per day  . latanoprost  1 drop Left Eye QHS  . pantoprazole (PROTONIX) IV  40 mg Intravenous Q24H  . sodium chloride  3 mL Intravenous Q12H    Continuous Infusions: . sodium chloride 0.9 % 1,000 mL with potassium chloride 40 mEq infusion 100 mL/hr at 03/23/14 1155    Past Medical History  Diagnosis Date  . Depression     Controlled with SSRI  . HTN (hypertension)   . GERD (gastroesophageal reflux disease)     Chronic PPI's  . S/P  dilatation of esophageal stricture     7/12 Dr Carlean Purl. Dilation of tight stricture proximal eso.  Prior dilation 09 and 08.  . Seasonal allergies     Zyrtec D PRN  . NASH (nonalcoholic steatohepatitis)     Per [place] 10/10  . History of PSVT (paroxysmal supraventricular tachycardia)     Remote and self limited.  . Gout     No crystal dx. R podegra x 3 episodes around 2010. Did not take the allopurinol that was rec 2011.  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 Chronic venous insufficiency     Present for years. Norvasc D/C'd 2012 thinking it might be contributing.  . Migraines   . Glaucoma   . Neuropathy   . Back pain   . Muscle weakness   . Polymyositis   . 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 Cholecystectomy    . Tubal ligation    . Laparoscopic burch procedure  04/02/07    Burch colposuspension. Inadvertant bladder damage - bladder repair, left ureteral stent placement, .  . Knee arthroscopy    . Abdominal hysterectomy  1980    2/2 menorrhagia  . Colonoscopy  09/28/2006    diverticulosis  . 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 dyspmotility of esophagus  . Bladder repair    . Eye surgery      cataracts, glaucoma  . Muscle biopsy Right 11/18/2012    Procedure: right vastus lateralis muscle biopsy ;  Surgeon: Haywood Lasso, MD;  Location: [facility];  Service: General;  Laterality: Right;  . Esophagogastroduodenoscopy N/A 05/06/2013    Procedure: ESOPHAGOGASTRODUODENOSCOPY (EGD) Probable Venia Minks.;  Surgeon: Jerene Bears, MD;  Location: [facility];  Service: Gastroenterology;  Laterality: N/A;    Carlis Stable MS, [place], LDN [phone] Pager [phone] Weekend/After Hours Pager

## 2014-03-23 NOTE — Progress Notes (Signed)
Clinical Social Work Department BRIEF PSYCHOSOCIAL ASSESSMENT 03/23/2014  Patient:  Heather [name], Heather [name]     Account Number:  [id number]     Admit date:  03/22/2014  Clinical Social Worker:  Earlie Server  Date/Time:  03/23/2014 04:00 PM  Referred by:  Physician  Date Referred:  03/23/2014 Referred for  Psychosocial assessment   Other Referral:   Interview type:  Patient Other interview type:    PSYCHOSOCIAL DATA Living Status:  FAMILY Admitted from facility:   Level of care:   Primary support name:  [name] Primary support relationship to patient:  CHILD, ADULT Degree of support available:   Strong    CURRENT CONCERNS Current Concerns  Post-Acute Placement   Other Concerns:    SOCIAL WORK ASSESSMENT / PLAN CSW received referral in order to assist with DC planning. CSW reviewed chart and met with patient, son, and granddtr at bedside. CSW introduced myself and explained role.    Patient reports she lives with family but is alone for several hours a day. Patient is aware of PT recommendations for SNF placement and patient and family feel this would be beneficial. CSW provided SNF list and explained insurance authorization would be needed. Patient agreeable to [facility] search.    CSW completed FL2 and faxed out. CSW will follow up with bed offers.   Assessment/plan status:  Psychosocial Support/Ongoing Assessment of Needs Other assessment/ plan:   Information/referral to community resources:   SNF list    PATIENT'S/FAMILY'S RESPONSE TO PLAN OF CARE: Patient alert and oriented. Patient and family engaged and agreeable that SNF is best option at DC. Son reports that patient has been declining and feels that SNF is needed. Patient reports she wants to look at list prior to making a decision but reports she understands process. Patient thanked CSW for information.       [place], [place] [phone]

## 2014-03-24 DIAGNOSIS — R1314 Dysphagia, pharyngoesophageal phase: Secondary | ICD-10-CM

## 2014-03-24 LAB — CBC
HCT: 33.9 % — ABNORMAL LOW (ref 36.0–46.0)
Hemoglobin: 11.8 g/dL — ABNORMAL LOW (ref 12.0–15.0)
MCH: 33.8 pg (ref 26.0–34.0)
MCHC: 34.8 g/dL (ref 30.0–36.0)
MCV: 97.1 fL (ref 78.0–100.0)
Platelets: 274 10*3/uL (ref 150–400)
RBC: 3.49 MIL/uL — ABNORMAL LOW (ref 3.87–5.11)
RDW: 13.1 % (ref 11.5–15.5)
WBC: 5.7 10*3/uL (ref 4.0–10.5)

## 2014-03-24 LAB — BASIC METABOLIC PANEL
ANION GAP: 10 (ref 5–15)
BUN: 5 mg/dL — ABNORMAL LOW (ref 6–23)
CO2: 24 mEq/L (ref 19–32)
Calcium: 9 mg/dL (ref 8.4–10.5)
Chloride: 102 mEq/L (ref 96–112)
Creatinine, Ser: 0.36 mg/dL — ABNORMAL LOW (ref 0.50–1.10)
GFR calc non Af Amer: >90 mL/min (ref >90)
Glucose, Bld: 91 mg/dL (ref 70–99)
POTASSIUM: 4.4 meq/L (ref 3.7–5.3)
SODIUM: 136 meq/L — AB (ref 137–147)

## 2014-03-24 MED ORDER — SODIUM CHLORIDE 0.9 % IV SOLN
INTRAVENOUS | Status: DC
  Administered 2014-03-24: 08:00:00 via INTRAVENOUS

## 2014-03-24 MED ORDER — PANTOPRAZOLE SODIUM 40 MG PO TBEC
40.0000 mg | DELAYED_RELEASE_TABLET | Freq: Every day | ORAL | Status: DC
Start: 2014-03-25 — End: 2014-03-25
  Administered 2014-03-25: 40 mg via ORAL
  Filled 2014-03-24: qty 1

## 2014-03-24 MED ORDER — SODIUM CHLORIDE 0.9 % IV SOLN
INTRAVENOUS | Status: DC
Start: 2014-03-24 — End: 2014-03-24
  Filled 2014-03-24: qty 1000

## 2014-03-24 MED ORDER — ZOLPIDEM TARTRATE 5 MG PO TABS
5.0000 mg | ORAL_TABLET | Freq: Every evening | ORAL | Status: DC | PRN
  Administered 2014-03-24: 5 mg via ORAL
  Filled 2014-03-24: qty 1

## 2014-03-24 NOTE — Consult Note (Signed)
NEURO HOSPITALIST CONSULT NOTE    Reason for Consult:inclusion body myositis.   HPI:                                                                                                                                          Heather [name] is an 73 y.o. 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 She is followed by Dr. [name] as a out patient.  She states her primary neurologist was setting her up with a out patient specialist for further evaluation of her myositis but she has been running into problems with getting the appointment.  As far as her recent weakness, she states she has not become any weaker than she was when she was seen in May of 2015. She is hospitalized this date due to falling backward and hitting her head. Neurology was asked to see patient to give advise on role for steroids or IVIG.   Past Medical History  Diagnosis Date  . Depression     Controlled with SSRI  . HTN (hypertension)   . GERD (gastroesophageal reflux disease)     Chronic PPI's  . S/P dilatation of esophageal stricture     7/12 Dr Carlean Purl. Dilation of tight stricture proximal eso.  Prior dilation 09 and 08.  . Seasonal allergies     Zyrtec D PRN  . NASH (nonalcoholic steatohepatitis)     Per [place] 10/10  . History of PSVT (paroxysmal supraventricular tachycardia)     Remote and self limited.  . Gout     No crystal dx. R podegra x 3 episodes around 2010. Did not take the allopurinol that was rec 2011.  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 Chronic venous insufficiency     Present for years. Norvasc D/C'd 2012 thinking it might be contributing.  . Migraines   . Glaucoma   . Neuropathy   . Back pain   . Muscle weakness   . Polymyositis   . 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 Cholecystectomy    .  Tubal ligation    . Laparoscopic burch procedure  04/02/07    Burch colposuspension. Inadvertant bladder damage - bladder repair, left ureteral stent placement, .  . Knee arthroscopy    . Abdominal hysterectomy  1980    2/2 menorrhagia  . Colonoscopy  09/28/2006    diverticulosis  . 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 dyspmotility of esophagus  . Bladder repair    . Eye surgery      cataracts, glaucoma  . Muscle  biopsy Right 11/18/2012    Procedure: right vastus lateralis muscle biopsy ;  Surgeon: Haywood Lasso, MD;  Location: [facility];  Service: General;  Laterality: Right;  . Esophagogastroduodenoscopy N/A 05/06/2013    Procedure: ESOPHAGOGASTRODUODENOSCOPY (EGD) Probable Venia Minks.;  Surgeon: Jerene Bears, MD;  Location: [facility];  Service: Gastroenterology;  Laterality: N/A;    Family History  Problem Relation Age of Onset  . Heart disease Mother   . Hypertension Mother   . Heart attack Mother   . Heart disease Father   . Hypertension Father   . Stroke Father   . Heart disease Sister   . Hypertension Sister   . Throat cancer Brother   . Hypertension Brother   . Heart disease Brother   . Colon cancer Neg Hx      Social History:  reports that she quit smoking about 45 years ago. Her smoking use included Cigarettes. She smoked 0.00 packs per day. She has never used smokeless tobacco. She reports that she does not drink alcohol or use illicit drugs.  Allergies  Allergen Reactions  . Propoxyphene Napsylate Nausea Only  . Darvon   . Latex     Per Patient latex=rash "hands look burned"    MEDICATIONS:                                                                                                                     Scheduled: . antiseptic oral rinse  7 mL Mouth Rinse BID  . FLUoxetine  40 mg Oral Daily  . fluticasone  2 spray Each Nare Daily  . heparin  5,000 Units  Subcutaneous 3 times per day  . lactose free nutrition  237 mL Oral TID WC  . latanoprost  1 drop Left Eye QHS  . pantoprazole (PROTONIX) IV  40 mg Intravenous Q24H  . sodium chloride  3 mL Intravenous Q12H     ROS:                                                                                                                                       History obtained from the patient  General ROS: negative for - chills, fatigue, fever, night sweats, weight gain or weight loss Psychological ROS: negative for - behavioral disorder, hallucinations, memory difficulties, mood swings or suicidal ideation Ophthalmic ROS: negative for - blurry vision, double vision, eye pain or loss of vision ENT  ROS: negative for - epistaxis, nasal discharge, oral lesions, sore throat, tinnitus or vertigo Allergy and Immunology ROS: negative for - hives or itchy/watery eyes Hematological and Lymphatic ROS: negative for - bleeding problems, bruising or swollen lymph nodes Endocrine ROS: negative for - galactorrhea, hair pattern changes, polydipsia/polyuria or temperature intolerance Respiratory ROS: negative for - cough, hemoptysis, shortness of breath or wheezing Cardiovascular ROS: negative for - chest pain, dyspnea on exertion, edema or irregular heartbeat Gastrointestinal ROS: negative for - abdominal pain, diarrhea, hematemesis, nausea/vomiting or stool incontinence Genito-Urinary ROS: negative for - dysuria, hematuria, incontinence or urinary frequency/urgency Musculoskeletal ROS: negative for - joint swelling or muscular weakness Neurological ROS: as noted in HPI Dermatological ROS: negative for rash and skin lesion changes   Blood pressure 148/73, pulse 84, temperature 98 F (36.7 C), temperature source Oral, resp. rate 18, height 5\' 10"  (1.778 m), weight 82.1 kg (181 lb), last menstrual period 11/08/1978, SpO2 100.00%.   Neurologic Examination:                                                                                                       General: NAD Mental Status: Alert, oriented, thought content appropriate.  Speech fluent without evidence of aphasia.  Able to follow 3 step commands without difficulty. Cranial Nerves: II: Discs flat bilaterally; Visual fields grossly normal, pupils equal, round, reactive to light and accommodation III,IV, VI: ptosis not present, extra-ocular motions intact bilaterally V,VII: smile symmetric, facial light touch sensation normal bilaterally VIII: hearing normal bilaterally IX,X: gag reflex present XI: bilateral shoulder shrug XII: midline tongue extension without atrophy or fasciculations  Motor: Right : Upper extremity   4/5    Left:     Upper extremity   4/5  Lower extremity   4/5     Lower extremity   3/5 Tone and bulk:normal tone throughout; no atrophy noted Sensory: Pinprick and light touch intact throughout, bilaterally Deep Tendon Reflexes:  Right: Upper Extremity   Left: Upper extremity   biceps (C-5 to C-6) 2/4   biceps (C-5 to C-6) 2/4 tricep (C7) 2/4    triceps (C7) 2/4 Brachioradialis (C6) 2/4  Brachioradialis (C6) 2/4  Lower Extremity Lower Extremity  quadriceps (L-2 to L-4) 2/4   quadriceps (L-2 to L-4) 2/4 Achilles (S1) 0/4   Achilles (S1) 0/4  Plantars: Absent bilaterally Cerebellar: normal finger-to-nose,  normal heel-to-shin test Gait: not tested CV: pulses palpable throughout    Lab Results: Basic Metabolic Panel:  Recent Labs Lab 03/22/14 2049 03/23/14 0500 03/24/14 0552  NA 131* 134* 136*  K 3.6* 3.2* 4.4  CL 90* 97 102  CO2 28 27 24   GLUCOSE 139* 111* 91  BUN 9 8 5*  CREATININE 0.45* 0.40* 0.36*  CALCIUM 10.2 9.0 9.0  MG  --  1.8  --     Liver Function Tests:  Recent Labs Lab 03/22/14 2049 03/23/14 0500  AST 34 27  ALT 34 28  ALKPHOS 74 62  BILITOT 0.3 0.4  PROT 7.9 7.0  ALBUMIN 4.1 3.4*   No results found for this basename:  LIPASE, AMYLASE,  in the last 168 hours No results found for  this basename: AMMONIA,  in the last 168 hours  CBC:  Recent Labs Lab 03/22/14 2049 03/23/14 0500 03/24/14 0552  WBC 12.4* 8.0 5.7  NEUTROABS 7.9*  --   --   HGB 12.8 11.7* 11.8*  HCT 35.3* 32.7* 33.9*  MCV 94.9 94.5 97.1  PLT PLATELET CLUMPS NOTED ON SMEAR, COUNT APPEARS ADEQUATE 281 274    Cardiac Enzymes:  Recent Labs Lab 03/22/14 2047  CKTOTAL 499*    Lipid Panel: No results found for this basename: CHOL, TRIG, HDL, CHOLHDL, VLDL, LDLCALC,  in the last 168 hours  CBG: No results found for this basename: GLUCAP,  in the last 168 hours  Microbiology: Results for orders placed in visit on 10/20/13  URINE CULTURE     Status: None   Collection Time    10/20/13  4:23 PM      Result Value Ref Range Status   Culture CITROBACTER KOSERI   Final   Colony Count >=100,000 COLONIES/ML   Final   Organism ID, Bacteria CITROBACTER KOSERI   Final    Coagulation Studies: No results found for this basename: LABPROT, INR,  in the last 72 hours  Imaging: Dg Pelvis 1-2 Views  03/22/2014   CLINICAL DATA:  Fall today with low back and posterior pelvic pain.  EXAM: PELVIS - 1-2 VIEW  COMPARISON:  12/17/2011 lumbar spine radiographs.  FINDINGS: Both femoral heads are located. Osteoarthritis involves both hips mildly. Sacroiliac joints are symmetric. No acute fracture. Surgical staples in the pelvis.  IMPRESSION: No acute osseous abnormality.   Electronically Signed   By: Abigail Miyamoto M.D.   On: 03/22/2014 18:10   Ct Head Wo Contrast  03/22/2014   CLINICAL DATA:  [place].  Hit head.  EXAM: CT HEAD WITHOUT CONTRAST  CT CERVICAL SPINE WITHOUT CONTRAST  TECHNIQUE: Multidetector CT imaging of the head and cervical spine was performed following the standard protocol without intravenous contrast. Multiplanar CT image reconstructions of the cervical spine were also generated.  COMPARISON:  None.  FINDINGS: CT HEAD FINDINGS  The ventricles are normal in size and configuration. No extra-axial fluid  collections are identified. The gray-white differentiation is normal. No CT findings for acute intracranial process such as hemorrhage or infarction. No mass lesions. The brainstem and cerebellum are grossly normal.  There is a scalp hematoma in the left occipital region. No underlying skull fracture. The bony structures are intact. The paranasal sinuses and mastoid air cells are clear. The globes are intact.  CT CERVICAL SPINE FINDINGS  Moderate degenerative cervical spondylosis with multilevel disc disease and facet disease. No acute cervical spine fracture is identified. Moderate degenerative changes at C1-2. The C1-2 articulations are maintained. The dens is intact. No abnormal prevertebral soft tissue swelling. The facets are normally aligned. No facet or laminar fractures.  No large disc protrusions.  The lungs are grossly clear.  IMPRESSION: No acute intracranial findings or skull fracture.  Scalp hematoma in the left occipital region.  Degenerative cervical spondylosis but no acute cervical spine fracture.   Electronically Signed   By: Kalman Jewels M.D.   On: 03/22/2014 18:18   Ct Cervical Spine Wo Contrast  03/22/2014   CLINICAL DATA:  [place].  Hit head.  EXAM: CT HEAD WITHOUT CONTRAST  CT CERVICAL SPINE WITHOUT CONTRAST  TECHNIQUE: Multidetector CT imaging of the head and cervical spine was performed following the standard protocol without intravenous contrast. Multiplanar CT image  reconstructions of the cervical spine were also generated.  COMPARISON:  None.  FINDINGS: CT HEAD FINDINGS  The ventricles are normal in size and configuration. No extra-axial fluid collections are identified. The gray-white differentiation is normal. No CT findings for acute intracranial process such as hemorrhage or infarction. No mass lesions. The brainstem and cerebellum are grossly normal.  There is a scalp hematoma in the left occipital region. No underlying skull fracture. The bony structures are intact. The paranasal  sinuses and mastoid air cells are clear. The globes are intact.  CT CERVICAL SPINE FINDINGS  Moderate degenerative cervical spondylosis with multilevel disc disease and facet disease. No acute cervical spine fracture is identified. Moderate degenerative changes at C1-2. The C1-2 articulations are maintained. The dens is intact. No abnormal prevertebral soft tissue swelling. The facets are normally aligned. No facet or laminar fractures.  No large disc protrusions.  The lungs are grossly clear.  IMPRESSION: No acute intracranial findings or skull fracture.  Scalp hematoma in the left occipital region.  Degenerative cervical spondylosis but no acute cervical spine fracture.   Electronically Signed   By: Kalman Jewels M.D.   On: 03/22/2014 18:18   Dg Swallowing Func-speech Pathology  03/23/2014   Lorre Nick, CCC-SLP     03/23/2014  3:16 PM Objective Swallowing Evaluation: Modified Barium Swallowing Study   Patient Details  Name: Heather [name] MRN: [id number] Date of Birth: 1940/10/04  Today's Date: 03/23/2014 Time: 1330-1400 SLP Time Calculation (min): 30 min  Past Medical History:  Past Medical History  Diagnosis Date  . Depression     Controlled with SSRI  . HTN (hypertension)   . GERD (gastroesophageal reflux disease)     Chronic PPI's  . S/P dilatation of esophageal stricture     7/12 Dr Carlean Purl. Dilation of tight stricture proximal eso.   Prior dilation 09 and 08.  . Seasonal allergies     Zyrtec D PRN  . NASH (nonalcoholic steatohepatitis)     Per [place] 10/10  . History of PSVT (paroxysmal supraventricular tachycardia)     Remote and self limited.  . Gout     No crystal dx. R podegra x 3 episodes around 2010. Did not take  the allopurinol that was rec 2011.  Marland Kitchen LGSIL (low grade squamous intraepithelial lesion) on Pap smear  02/06/07    HPV Gyn 8/08 negative  . Chronic venous insufficiency     Present for years. Norvasc D/C'd 2012 thinking it might be  contributing.  . Migraines   . Glaucoma   . Neuropathy    . Back pain   . Muscle weakness   . Polymyositis   . 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 Cholecystectomy    . Tubal ligation    . Laparoscopic burch procedure  04/02/07    Burch colposuspension. Inadvertant bladder damage - bladder  repair, left ureteral stent placement, .  . Knee arthroscopy    . Abdominal hysterectomy  1980    2/2 menorrhagia  . Colonoscopy  09/28/2006    diverticulosis  . Esophagogastroduodenoscopy  09/28/2006; 07/17/2008    dilation of proximal stenosis/cricopharyngeal achalasais both  times, occult ring, ? dyspmotility of esophagus  . Bladder repair    . Eye surgery      cataracts, glaucoma  . Muscle biopsy Right 11/18/2012    Procedure: right vastus lateralis muscle biopsy ;  Surgeon:  Haywood Lasso, MD;  Location: [facility];  Service: General;   Laterality: Right;  . Esophagogastroduodenoscopy N/A 05/06/2013    Procedure: ESOPHAGOGASTRODUODENOSCOPY (EGD) Probable Venia Minks.;   Surgeon: Jerene Bears, MD;  Location: [facility];  Service:  Gastroenterology;  Laterality: N/A;   HPI:  73 year old female admitted 03/22/14 due to weakness and fall. PMH  significant for dysphagia secondary to myositis and esophageal  stricture, GERD. BSE ordered to evaluate swallow function and  safety as decisions are being made when/if to place PEG tube.  After discussion with Dr. Grandville Silos and pt, it was decided to  proceed with BSE     Assessment / Plan / Recommendation Clinical Impression  Dysphagia Diagnosis: Severe pharyngeal phase dysphagia Pt  exhibits continued severe pharyngeal motor and sensory based  dysphagia, similar in presentation to MBS report from October  2014. Still noted are nearly absent tongue base retraction and  epiglottic deflection, poor laryngeal elevation, and reduced  pharyngeal contraction.  The swallow is significantly delayed,  with posterior spill to the pyriform sinus  across consistencies.  Post-swallow residue is seen on all consistencies tested, and was  noted to increase in amount as the thickness increased. Pt able  to take liquid wash of water and clear vallecular and pyriform  residue.  Of note, NO penetration or aspiration was observed with  any consistency tested, despite bolus size or rate. At this time,  full liquid diet is recommended for pt, with thin, nectar, and  honey thick liquid consistencies allowable. Decisions are pending  regarding placement of PEG tube. It is the opinion of this SLP.  after discussion with pt following MBS, that PEG placement would  provide her with adequate nutrition and hydration, allowing po  intake to be purely oral satisfaction.  Pt indicated she wanted  fried chicken - Pt was encouraged when SLP explained how she  could have anything she desires, as long as it has been processed  thoroughly in a blender, and thinned to either a honey, nectar,  or thin consistency using an appropriate medium (milk, butter,  water, sauce, etc). SLP provided several examples of how this has  been done for a similar situation.  Home health ST is strongly  recommended to allow pt to have therapy in her home environment  for education re: how to achieve the proper consistency.    Treatment Recommendation  Therapy as outlined in treatment plan below    Diet Recommendation Honey-thick liquid;Nectar-thick liquid;Thin  liquid (full liquid)   Liquid Administration via: Spoon;Cup;Straw Medication Administration: Via alternative means Supervision: Patient able to self feed Compensations: Slow rate;Small sips/bites Postural Changes and/or Swallow Maneuvers: Seated upright 90  degrees    Other  Recommendations Recommended Consults: MBS Oral Care Recommendations: Oral care BID   Follow Up Recommendations  Home health SLP    Frequency and Duration min 1 x/week  1 week   Pertinent Vitals/Pain VSS, headache reported    SLP Swallow Goals   see care plan   General Date of  Onset: 03/22/14 HPI: 73 year old female admitted 03/22/14 due to weakness and  fall. PMH significant for dysphagia secondary to myositis and  esophageal stricture, GERD. BSE ordered to evaluate swallow  function and safety as decisions are being made when/if to place  PEG tube. After discussion with Dr. Grandville Silos and pt, it was  decided to proceed with BSE Type of Study: Modified Barium Swallowing Study Reason for Referral: Objectively evaluate swallowing function Previous Swallow  Assessment: MBS 05/2013 revealed severe  pharyngeal and severe esophageal dysphagia. Diet Prior to this Study: Thin liquids Temperature Spikes Noted: No Respiratory Status: Room air History of Recent Intubation: No Behavior/Cognition: Alert;Pleasant mood;Cooperative Oral Cavity - Dentition: Adequate natural dentition Oral Motor / Sensory Function: Within functional limits Self-Feeding Abilities: Able to feed self Patient Positioning: Upright in chair Baseline Vocal Quality: Wet Volitional Cough: Weak Volitional Swallow: Unable to elicit Anatomy: Within functional limits Pharyngeal Secretions: Not observed secondary MBS    Reason for Referral Objectively evaluate swallowing function   Oral Phase Oral Preparation/Oral Phase Oral Phase: WFL   Pharyngeal Phase Pharyngeal Phase Pharyngeal Phase: Impaired Pharyngeal - Honey Pharyngeal - Honey Teaspoon: Delayed swallow initiation;Premature  spillage to valleculae;Premature spillage to pyriform  sinuses;Reduced pharyngeal peristalsis;Reduced epiglottic  inversion;Reduced anterior laryngeal mobility;Reduced laryngeal  elevation;Reduced tongue base retraction;Pharyngeal residue -  valleculae;Pharyngeal residue - pyriform sinuses Pharyngeal - Nectar Pharyngeal - Nectar Cup: Delayed swallow initiation;Premature  spillage to valleculae;Premature spillage to pyriform  sinuses;Reduced laryngeal elevation;Reduced anterior laryngeal  mobility;Reduced pharyngeal peristalsis;Reduced epiglottic   inversion;Reduced tongue base retraction;Pharyngeal residue -  valleculae;Pharyngeal residue - pyriform sinuses Pharyngeal - Nectar Straw: Reduced pharyngeal peristalsis;Reduced  tongue base retraction;Reduced epiglottic inversion;Delayed  swallow initiation;Reduced anterior laryngeal mobility;Premature  spillage to valleculae;Reduced laryngeal elevation;Premature  spillage to pyriform sinuses;Pharyngeal residue - pyriform  sinuses;Pharyngeal residue - valleculae Pharyngeal - Thin Pharyngeal - Thin Teaspoon: Delayed swallow initiation;Premature  spillage to valleculae;Premature spillage to pyriform  sinuses;Reduced laryngeal elevation;Reduced anterior laryngeal  mobility;Reduced epiglottic inversion;Reduced pharyngeal  peristalsis;Reduced tongue base retraction;Pharyngeal residue -  valleculae;Pharyngeal residue - pyriform sinuses Pharyngeal - Thin Cup: Delayed swallow initiation;Premature  spillage to valleculae;Premature spillage to pyriform  sinuses;Reduced laryngeal elevation;Reduced anterior laryngeal  mobility;Reduced epiglottic inversion;Reduced pharyngeal  peristalsis;Reduced tongue base retraction;Pharyngeal residue -  valleculae;Pharyngeal residue - pyriform sinuses Pharyngeal - Thin Straw: Delayed swallow initiation;Premature  spillage to valleculae;Premature spillage to pyriform  sinuses;Reduced laryngeal elevation;Pharyngeal residue - pyriform  sinuses;Pharyngeal residue - valleculae;Reduced anterior  laryngeal mobility;Reduced epiglottic inversion;Reduced  pharyngeal peristalsis;Reduced tongue base retraction  Cervical Esophageal Phase    GO   Celia B. 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  Patient is followed by Dr. [name] as a out patient.  [facility] is making a appointment for [facility] clinic however date for appointment is not known as of this time. Patient states she has not worsened and feels she clinically is the same as when she saw Dr. [name] in May.  Do to no worsening of symptoms would not recommend IVIG at this time. If patient were to clinically worsen, IVIG trial would be a consideration, as a definitive diagnosis of IBM is presently uncertain (no confirmed effective treatment for IBM) . At this time, would recommend patient follow up with Dr. [name] as a out patient and follow up on her MDA referral.    Patient seen and examined together with physician assistant and I concur with the assessment and plan.  Dorian Pod, MD

## 2014-03-24 NOTE — Progress Notes (Signed)
[facility] PROGRESS NOTE  Heather [name] NGE:[id number] DOB: 06-Mar-1941 DOA: 03/22/2014 PCP: [name], MD  Assessment/Plan: #1 presyncope Likely secondary to orthostasis. Patient noted to be orthostatic on admission. Patient has been hydrated with IV fluids with resolution of orthostasis. Clinical improvement. PT/OT.  #2 orthostasis Improved with hydration. Normal saline lock IV fluids.  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 Patient is being scheduled to be seen by M.D. D. clinic at [facility] at which point after evaluation there to be decided when PEG should be placed. Continue current diet of full liquids as per speech therapy evaluation. Patient will need outpatient speech therapy for followup.  #4 inclusion body myositis/generalized weakness Patient has been seen by neurology and patient states some improvement in her weakness. At this point in time per neurology no need for steroids or immunosuppressive therapy. Patient will need outpatient followup with her neurologist, Dr. [name] post discharge hopefully one week post discharge.  #5 gastroesophageal reflux disease PPI.  #6 hypertension Blood pressure medications on hold secondary to orthostasis. Normal saline lock IV fluids. Will start to resume home regimen in the next one to 2 days.  #7 prophylaxis Heparin for DVT prophylaxis.  Code Status: Full Family Communication: Updated patient no family present. Disposition Plan: SNF in 1-2 days   Consultants:  Neurology: Dr Aram Beecham 03/24/14  Procedures:  Xray pelvis 03/22/14  MBS 03/23/14    Antibiotics:  None  HPI/Subjective: Patient states she's feeling better. Still weak.  Objective: Filed Vitals:   03/24/14 0757  BP: 148/73  Pulse: 84  Temp:   Resp:     Intake/Output Summary (Last 24 hours) at 03/24/14 1116 Last data filed at 03/24/14 0733  Gross per 24 hour  Intake 2323.33 ml  Output    500 ml  Net  1823.33 ml   Filed Weights   03/23/14 0116  Weight: 82.1 kg (181 lb)    Exam:   General:  NAD  Cardiovascular: RRR  Respiratory: ctab  Abdomen: soft/nt/nd/+bs  Musculoskeletal: No c/c/e   Data Reviewed: Basic Metabolic Panel:  Recent Labs Lab 03/22/14 2049 03/23/14 0500 03/24/14 0552  NA 131* 134* 136*  K 3.6* 3.2* 4.4  CL 90* 97 102  CO2 28 27 24   GLUCOSE 139* 111* 91  BUN 9 8 5*  CREATININE 0.45* 0.40* 0.36*  CALCIUM 10.2 9.0 9.0  MG  --  1.8  --    Liver Function Tests:  Recent Labs Lab 03/22/14 2049 03/23/14 0500  AST 34 27  ALT 34 28  ALKPHOS 74 62  BILITOT 0.3 0.4  PROT 7.9 7.0  ALBUMIN 4.1 3.4*   No results found for this basename: LIPASE, AMYLASE,  in the last 168 hours No results found for this basename: AMMONIA,  in the last 168 hours CBC:  Recent Labs Lab 03/22/14 2049 03/23/14 0500 03/24/14 0552  WBC 12.4* 8.0 5.7  NEUTROABS 7.9*  --   --   HGB 12.8 11.7* 11.8*  HCT 35.3* 32.7* 33.9*  MCV 94.9 94.5 97.1  PLT PLATELET CLUMPS NOTED ON SMEAR, COUNT APPEARS ADEQUATE 281 274   Cardiac Enzymes:  Recent Labs Lab 03/22/14 2047  CKTOTAL 499*   BNP (last 3 results) No results found for this basename: PROBNP,  in the last 8760 hours CBG: No results found for this basename: GLUCAP,  in the last 168 hours  No results found for this or any previous visit (from the past 240 hour(s)).   Studies: Dg Pelvis  1-2 Views  03/22/2014   CLINICAL DATA:  Fall today with low back and posterior pelvic pain.  EXAM: PELVIS - 1-2 VIEW  COMPARISON:  12/17/2011 lumbar spine radiographs.  FINDINGS: Both femoral heads are located. Osteoarthritis involves both hips mildly. Sacroiliac joints are symmetric. No acute fracture. Surgical staples in the pelvis.  IMPRESSION: No acute osseous abnormality.   Electronically Signed   By: Abigail Miyamoto M.D.   On: 03/22/2014 18:10   Ct Head Wo Contrast  03/22/2014   CLINICAL DATA:  [place].  Hit head.  EXAM: CT HEAD  WITHOUT CONTRAST  CT CERVICAL SPINE WITHOUT CONTRAST  TECHNIQUE: Multidetector CT imaging of the head and cervical spine was performed following the standard protocol without intravenous contrast. Multiplanar CT image reconstructions of the cervical spine were also generated.  COMPARISON:  None.  FINDINGS: CT HEAD FINDINGS  The ventricles are normal in size and configuration. No extra-axial fluid collections are identified. The gray-white differentiation is normal. No CT findings for acute intracranial process such as hemorrhage or infarction. No mass lesions. The brainstem and cerebellum are grossly normal.  There is a scalp hematoma in the left occipital region. No underlying skull fracture. The bony structures are intact. The paranasal sinuses and mastoid air cells are clear. The globes are intact.  CT CERVICAL SPINE FINDINGS  Moderate degenerative cervical spondylosis with multilevel disc disease and facet disease. No acute cervical spine fracture is identified. Moderate degenerative changes at C1-2. The C1-2 articulations are maintained. The dens is intact. No abnormal prevertebral soft tissue swelling. The facets are normally aligned. No facet or laminar fractures.  No large disc protrusions.  The lungs are grossly clear.  IMPRESSION: No acute intracranial findings or skull fracture.  Scalp hematoma in the left occipital region.  Degenerative cervical spondylosis but no acute cervical spine fracture.   Electronically Signed   By: Kalman Jewels M.D.   On: 03/22/2014 18:18   Ct Cervical Spine Wo Contrast  03/22/2014   CLINICAL DATA:  [place].  Hit head.  EXAM: CT HEAD WITHOUT CONTRAST  CT CERVICAL SPINE WITHOUT CONTRAST  TECHNIQUE: Multidetector CT imaging of the head and cervical spine was performed following the standard protocol without intravenous contrast. Multiplanar CT image reconstructions of the cervical spine were also generated.  COMPARISON:  None.  FINDINGS: CT HEAD FINDINGS  The ventricles are  normal in size and configuration. No extra-axial fluid collections are identified. The gray-white differentiation is normal. No CT findings for acute intracranial process such as hemorrhage or infarction. No mass lesions. The brainstem and cerebellum are grossly normal.  There is a scalp hematoma in the left occipital region. No underlying skull fracture. The bony structures are intact. The paranasal sinuses and mastoid air cells are clear. The globes are intact.  CT CERVICAL SPINE FINDINGS  Moderate degenerative cervical spondylosis with multilevel disc disease and facet disease. No acute cervical spine fracture is identified. Moderate degenerative changes at C1-2. The C1-2 articulations are maintained. The dens is intact. No abnormal prevertebral soft tissue swelling. The facets are normally aligned. No facet or laminar fractures.  No large disc protrusions.  The lungs are grossly clear.  IMPRESSION: No acute intracranial findings or skull fracture.  Scalp hematoma in the left occipital region.  Degenerative cervical spondylosis but no acute cervical spine fracture.   Electronically Signed   By: Kalman Jewels M.D.   On: 03/22/2014 18:18   Dg Swallowing Func-speech Pathology  03/23/2014   Lorre Nick, CCC-SLP  03/23/2014  3:16 PM Objective Swallowing Evaluation: Modified Barium Swallowing Study   Patient Details  Name: Heather [name] MRN: [id number] Date of Birth: Jun 28, 1941  Today's Date: 03/23/2014 Time: 1330-1400 SLP Time Calculation (min): 30 min  Past Medical History:  Past Medical History  Diagnosis Date  . Depression     Controlled with SSRI  . HTN (hypertension)   . GERD (gastroesophageal reflux disease)     Chronic PPI's  . S/P dilatation of esophageal stricture     7/12 Dr Carlean Purl. Dilation of tight stricture proximal eso.   Prior dilation 09 and 08.  . Seasonal allergies     Zyrtec D PRN  . NASH (nonalcoholic steatohepatitis)     Per [place] 10/10  . History of PSVT (paroxysmal supraventricular  tachycardia)     Remote and self limited.  . Gout     No crystal dx. R podegra x 3 episodes around 2010. Did not take  the allopurinol that was rec 2011.  Marland Kitchen LGSIL (low grade squamous intraepithelial lesion) on Pap smear  02/06/07    HPV Gyn 8/08 negative  . Chronic venous insufficiency     Present for years. Norvasc D/C'd 2012 thinking it might be  contributing.  . Migraines   . Glaucoma   . Neuropathy   . Back pain   . Muscle weakness   . Polymyositis   . 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 Cholecystectomy    . Tubal ligation    . Laparoscopic burch procedure  04/02/07    Burch colposuspension. Inadvertant bladder damage - bladder  repair, left ureteral stent placement, .  . Knee arthroscopy    . Abdominal hysterectomy  1980    2/2 menorrhagia  . Colonoscopy  09/28/2006    diverticulosis  . Esophagogastroduodenoscopy  09/28/2006; 07/17/2008    dilation of proximal stenosis/cricopharyngeal achalasais both  times, occult ring, ? dyspmotility of esophagus  . Bladder repair    . Eye surgery      cataracts, glaucoma  . Muscle biopsy Right 11/18/2012    Procedure: right vastus lateralis muscle biopsy ;  Surgeon:  Haywood Lasso, MD;  Location: [place];  Service: General;   Laterality: Right;  . Esophagogastroduodenoscopy N/A 05/06/2013    Procedure: ESOPHAGOGASTRODUODENOSCOPY (EGD) Probable Venia Minks.;   Surgeon: Jerene Bears, MD;  Location: [facility];  Service:  Gastroenterology;  Laterality: N/A;   HPI:  73 year old female admitted 03/22/14 due to weakness and fall. PMH  significant for dysphagia secondary to myositis and esophageal  stricture, GERD. BSE ordered to evaluate swallow function and  safety as decisions are being made when/if to place PEG tube.  After discussion with Dr. Grandville Silos and pt, it was decided to  proceed with BSE     Assessment / Plan / Recommendation Clinical Impression  Dysphagia  Diagnosis: Severe pharyngeal phase dysphagia Pt  exhibits continued severe pharyngeal motor and sensory based  dysphagia, similar in presentation to MBS report from October  2014. Still noted are nearly absent tongue base retraction and  epiglottic deflection, poor laryngeal elevation, and reduced  pharyngeal contraction.  The swallow is significantly delayed,  with posterior spill to the pyriform sinus across consistencies.  Post-swallow residue is seen on all consistencies tested, and was  noted to increase in amount as the thickness increased. Pt able  to take liquid wash of water and clear  vallecular and pyriform  residue.  Of note, NO penetration or aspiration was observed with  any consistency tested, despite bolus size or rate. At this time,  full liquid diet is recommended for pt, with thin, nectar, and  honey thick liquid consistencies allowable. Decisions are pending  regarding placement of PEG tube. It is the opinion of this SLP.  after discussion with pt following MBS, that PEG placement would  provide her with adequate nutrition and hydration, allowing po  intake to be purely oral satisfaction.  Pt indicated she wanted  fried chicken - Pt was encouraged when SLP explained how she  could have anything she desires, as long as it has been processed  thoroughly in a blender, and thinned to either a honey, nectar,  or thin consistency using an appropriate medium (milk, butter,  water, sauce, etc). SLP provided several examples of how this has  been done for a similar situation.  Home health ST is strongly  recommended to allow pt to have therapy in her home environment  for education re: how to achieve the proper consistency.    Treatment Recommendation  Therapy as outlined in treatment plan below    Diet Recommendation Honey-thick liquid;Nectar-thick liquid;Thin  liquid (full liquid)   Liquid Administration via: Spoon;Cup;Straw Medication Administration: Via alternative means Supervision: Patient able to  self feed Compensations: Slow rate;Small sips/bites Postural Changes and/or Swallow Maneuvers: Seated upright 90  degrees    Other  Recommendations Recommended Consults: MBS Oral Care Recommendations: Oral care BID   Follow Up Recommendations  Home health SLP    Frequency and Duration min 1 x/week  1 week   Pertinent Vitals/Pain VSS, headache reported    SLP Swallow Goals   see care plan   General Date of Onset: 03/22/14 HPI: 73 year old female admitted 03/22/14 due to weakness and  fall. PMH significant for dysphagia secondary to myositis and  esophageal stricture, GERD. BSE ordered to evaluate swallow  function and safety as decisions are being made when/if to place  PEG tube. After discussion with Dr. Grandville Silos and pt, it was  decided to proceed with BSE Type of Study: Modified Barium Swallowing Study Reason for Referral: Objectively evaluate swallowing function Previous Swallow Assessment: MBS 05/2013 revealed severe  pharyngeal and severe esophageal dysphagia. Diet Prior to this Study: Thin liquids Temperature Spikes Noted: No Respiratory Status: Room air History of Recent Intubation: No Behavior/Cognition: Alert;Pleasant mood;Cooperative Oral Cavity - Dentition: Adequate natural dentition Oral Motor / Sensory Function: Within functional limits Self-Feeding Abilities: Able to feed self Patient Positioning: Upright in chair Baseline Vocal Quality: Wet Volitional Cough: Weak Volitional Swallow: Unable to elicit Anatomy: Within functional limits Pharyngeal Secretions: Not observed secondary MBS    Reason for Referral Objectively evaluate swallowing function   Oral Phase Oral Preparation/Oral Phase Oral Phase: WFL   Pharyngeal Phase Pharyngeal Phase Pharyngeal Phase: Impaired Pharyngeal - Honey Pharyngeal - Honey Teaspoon: Delayed swallow initiation;Premature  spillage to valleculae;Premature spillage to pyriform  sinuses;Reduced pharyngeal peristalsis;Reduced epiglottic  inversion;Reduced anterior laryngeal  mobility;Reduced laryngeal  elevation;Reduced tongue base retraction;Pharyngeal residue -  valleculae;Pharyngeal residue - pyriform sinuses Pharyngeal - Nectar Pharyngeal - Nectar Cup: Delayed swallow initiation;Premature  spillage to valleculae;Premature spillage to pyriform  sinuses;Reduced laryngeal elevation;Reduced anterior laryngeal  mobility;Reduced pharyngeal peristalsis;Reduced epiglottic  inversion;Reduced tongue base retraction;Pharyngeal residue -  valleculae;Pharyngeal residue - pyriform sinuses Pharyngeal - Nectar Straw: Reduced pharyngeal peristalsis;Reduced  tongue base retraction;Reduced epiglottic inversion;Delayed  swallow initiation;Reduced anterior laryngeal mobility;Premature  spillage to valleculae;Reduced laryngeal  elevation;Premature  spillage to pyriform sinuses;Pharyngeal residue - pyriform  sinuses;Pharyngeal residue - valleculae Pharyngeal - Thin Pharyngeal - Thin Teaspoon: Delayed swallow initiation;Premature  spillage to valleculae;Premature spillage to pyriform  sinuses;Reduced laryngeal elevation;Reduced anterior laryngeal  mobility;Reduced epiglottic inversion;Reduced pharyngeal  peristalsis;Reduced tongue base retraction;Pharyngeal residue -  valleculae;Pharyngeal residue - pyriform sinuses Pharyngeal - Thin Cup: Delayed swallow initiation;Premature  spillage to valleculae;Premature spillage to pyriform  sinuses;Reduced laryngeal elevation;Reduced anterior laryngeal  mobility;Reduced epiglottic inversion;Reduced pharyngeal  peristalsis;Reduced tongue base retraction;Pharyngeal residue -  valleculae;Pharyngeal residue - pyriform sinuses Pharyngeal - Thin Straw: Delayed swallow initiation;Premature  spillage to valleculae;Premature spillage to pyriform  sinuses;Reduced laryngeal elevation;Pharyngeal residue - pyriform  sinuses;Pharyngeal residue - valleculae;Reduced anterior  laryngeal mobility;Reduced epiglottic inversion;Reduced  pharyngeal peristalsis;Reduced tongue base  retraction  Cervical Esophageal Phase    GO   Celia B. Quentin Ore [facility], CCC-SLP [phone] Cervical Esophageal Phase Cervical Esophageal Phase:  (not visualized)         Shonna Chock 03/23/2014, 3:01 PM     Scheduled Meds: . antiseptic oral rinse  7 mL Mouth Rinse BID  . FLUoxetine  40 mg Oral Daily  . fluticasone  2 spray Each Nare Daily  . heparin  5,000 Units Subcutaneous 3 times per day  . lactose free nutrition  237 mL Oral TID WC  . latanoprost  1 drop Left Eye QHS  . [START ON 03/25/2014] pantoprazole  40 mg Oral Daily  . sodium chloride  3 mL Intravenous Q12H   Continuous Infusions: . sodium chloride 75 mL/hr at 03/24/14 3335    Principal Problem:   Pre-syncope Active Problems:   Orthostasis   GLAUCOMA NOS   HYPERTENSION   GERD   Other dysphagia   Inclusion body myositis   Weakness    Time spent: 35 mins    [facility] MD [facility] Pager [phone]. If 7PM-7AM, please contact night-coverage at [email or web address], password [facility] 03/24/2014, 11:16 AM  LOS: 2 days

## 2014-03-24 NOTE — Progress Notes (Signed)
Thank you for consult on Ms. Heather [name]. She was admitted yesterday with FTT and work up ongoing with question of PEG placement. Neurology evaluation indicates no new changes and no further treatment indicated at this time.   She lives alone and SNF recommended per therapy evaluations and concur with this. Will defer CIR consult for now.

## 2014-03-24 NOTE — Progress Notes (Signed)
Patient accepted bed offer @ [facility], awaiting [facility] HMO authorization.   Clinical Social Work Department CLINICAL SOCIAL WORK PLACEMENT NOTE 03/24/2014  Patient:  Heather [name], Heather [name]  Account Number:  [id number] Admit date:  03/22/2014  Clinical Social Worker:  Sindy Messing, LCSW  Date/time:  03/23/2014 04:00 PM  Clinical Social Work is seeking post-discharge placement for this patient at the following level of care:   [place]   (*CSW will update this form in Epic as items are completed)   03/23/2014  Patient/family provided with [place] Department of Clinical Social Work's list of facilities offering this level of care within the geographic area requested by the patient (or if unable, by the patient's family).  03/23/2014  Patient/family informed of their freedom to choose among providers that offer the needed level of care, that participate in Medicare, Medicaid or managed care program needed by the patient, have an available bed and are willing to accept the patient.  03/23/2014  Patient/family informed of [facility]' ownership interest in [facility], as well as of the fact that they are under no obligation to receive care at this facility.  PASARR submitted to EDS on 03/23/2014 PASARR number received on 03/23/2014  FL2 transmitted to all facilities in geographic area requested by pt/family on  03/23/2014 FL2 transmitted to all facilities within larger geographic area on   Patient informed that his/her managed care company has contracts with or will negotiate with  certain facilities, including the following:     Patient/family informed of bed offers received:  03/24/2014 Patient chooses bed at [facility] Physician recommends and patient chooses bed at    Patient to be transferred to [facility] on   Patient to be transferred to facility by  Patient and family notified of transfer on  Name of family  member notified:    The following physician request were entered in Epic:   Additional Comments:   Raynaldo Opitz, [place] Social Worker cell #: [phone]

## 2014-03-25 ENCOUNTER — Telehealth: Payer: Self-pay | Admitting: Neurology

## 2014-03-25 DIAGNOSIS — K219 Gastro-esophageal reflux disease without esophagitis: Secondary | ICD-10-CM

## 2014-03-25 LAB — BASIC METABOLIC PANEL
Anion gap: 11 (ref 5–15)
BUN: 4 mg/dL — ABNORMAL LOW (ref 6–23)
CO2: 27 meq/L (ref 19–32)
Calcium: 9.6 mg/dL (ref 8.4–10.5)
Chloride: 98 mEq/L (ref 96–112)
Creatinine, Ser: 0.35 mg/dL — ABNORMAL LOW (ref 0.50–1.10)
GFR calc Af Amer: >90 mL/min (ref >90)
Glucose, Bld: 98 mg/dL (ref 70–99)
POTASSIUM: 4 meq/L (ref 3.7–5.3)
SODIUM: 136 meq/L — AB (ref 137–147)

## 2014-03-25 LAB — ALDOLASE: ALDOLASE: 6.1 U/L (ref <=8.1)

## 2014-03-25 MED ORDER — ACETAMINOPHEN 650 MG RE SUPP: 650.0000 mg | RECTAL | Status: DC | PRN

## 2014-03-25 MED ORDER — PANTOPRAZOLE SODIUM 40 MG PO PACK: 40.0000 mg | PACK | Freq: Every day | ORAL | Status: DC

## 2014-03-25 MED ORDER — ACETAMINOPHEN 160 MG/5ML PO SOLN
650.0000 mg | Freq: Four times a day (QID) | ORAL | Status: DC | PRN
  Administered 2014-03-25: 650 mg via ORAL
  Filled 2014-03-25: qty 20.3

## 2014-03-25 MED ORDER — HYDROCODONE-ACETAMINOPHEN 5-325 MG PO TABS: 1.0000 | ORAL_TABLET | Freq: Four times a day (QID) | ORAL | Status: DC | PRN

## 2014-03-25 NOTE — Progress Notes (Signed)
Report called to [facility]. Report given to Marcie Mowers, RN. Staff has no questions at this time. Phone number left for further questions.

## 2014-03-25 NOTE — Progress Notes (Signed)
Speech Language Pathology Treatment: Dysphagia  Patient Details Name: Heather [name] MRN: [id number] DOB: [birth year] Today's Date: 03/25/2014 Time: 1660-6004 SLP Time Calculation (min): 42 min  Assessment / Plan / Recommendation Clinical Impression  Pt familiar to this SLP from previous MBS evaluation conducted in October 2014.   Interviewed pt re: progression of dysphagia, intake since last October.  Pt admits to some improvement with swallowing in later 2014 where we could tolerate soft solids.  However, progression of her dysphagia reported with her requiring nearly liquid diet since May of this year.  Pill dysphagia significant for pt and SLP asked RN to call pharmacy to review medications that could be changed to liquid form and/or are crushable.  Pt denies worsening in swallow function since this admission.    Note plan for pt to go to [facility] before decision re: PEG placement to determine if any treatment may ameliorate dysphagia.    SLP provided pt with high nutrition liquid menus to maximize calories/protein.  Pt has a blender at home but has a hard time opening the lid, advised her to blend foods and put in fridge, microwaving and adding extra sauce/gravy to make nearly liquid when ready to consume.  Pt stated she will attempt this strategy.    Advised pt to continue compensation tips to mitigate risk including conducting extra swallows and expectorating at end of meal as needed.  SLP did not provide active exercises for pt due to concern for possibly exacerbating her polymyositis and pt report of lack of benefit when conducted in 2014.    Will sign off as all education completed and pt has care plan in place for management.  Thanks for allowing me to help care for this pt.      HPI HPI: 73 year old female admitted 03/22/14 due to weakness and fall. PMH significant for dysphagia secondary to myositis and esophageal stricture, GERD. BSE ordered to evaluate swallow function and safety  as decisions are being made when/if to place PEG tube. After discussion with Dr. Grandville Silos and pt, it was decided to proceed with MBS.  MBS completed with findings of severe pharyngeal motor and sensori based dysphagia - with no aspiration or penetration.  SLP follow up for education and tolerance.     Pertinent Vitals Pain Assessment: No/denies pain  SLP Plan  All goals met    Recommendations Medication Administration: Crushed with puree (crush with liquids) Supervision: Patient able to self feed Compensations: Slow rate;Small sips/bites;Multiple dry swallows after each bite/sip Postural Changes and/or Swallow Maneuvers: Seated upright 90 degrees              Oral Care Recommendations: Oral care BID Follow up Recommendations:  (defer until after follow up at [facility] to determine if indicated) Plan: All goals met    [place], [place], [place] [facility] SLP [phone]

## 2014-03-25 NOTE — Telephone Encounter (Signed)
Patient calling to state that she is currently in the hospital and has apparently missed an appointment at [facility] because she didn't know about it, please return call and advise.

## 2014-03-25 NOTE — Discharge Summary (Signed)
Physician Discharge Summary  Heather [name] ZOX:[id number] DOB: Feb 01, 1941 DOA: 03/22/2014  PCP: [name], MD  Admit date: 03/22/2014 Discharge date: 03/25/2014  Time spent: 53minutes  Recommendations for Outpatient Follow-up:  1. Follow up with Dr. Ronny Flurry as an outpatinet. 2. Will go to SNF  Discharge Diagnoses:  Principal Problem:   Pre-syncope Active Problems:   GLAUCOMA NOS   HYPERTENSION   GERD   Other dysphagia   Inclusion body myositis   Weakness   Orthostasis   Discharge Condition: [place]  Diet recommendation: heart healthy  Filed Weights   03/23/14 0116  Weight: 82.1 kg (181 lb)    History of present illness:  73 y.o. female, with known history of inclusion body myositis, with known generalized weakness progressing over a few months, where she ambulates at baseline with a walker, and for longer distance she needs wheelchair ,as well progressive dysphagia, with known history of esophageal stenosis, as well to do to myositis, where PEG tube plans were discussed with neurology, patient presents with fall, reports its mechanical fall, as she was mopping the floor, she felt backward where she hit her head, she denies any loss of consciousness, altered mental status, focal deficits, CT head only showed a scalp hematoma, a patient did not have any significant lab abnormalities, urine analysis was negative, denies any cough or progressive sputum, and when patient is being planned to be discharged from the emergency room, while she was sitting on the wheelchair, she had presyncopal episode, where she kind of slumped on the chair for a few seconds, there was no urine or stool incontinence, no loss of consciousness, so hospitalist requested to admit the patient for further evaluation, patient denies any chest pain, shortness of breath, hemoptysis, focal deficits.   Hospital Course:  Presyncope  - Patient noted to be orthostatic on admission. Patient has been hydrated with  IV fluids with resolution of orthostasis.  - Clinical improvement. PT/OT rec SNF.   Dysphagia  - Likely secondary to worsening inclusion body myositis. Patient is being scheduled to be seen by M.D. D. clinic at [facility] at which point after evaluation there to be decided when PEG should be placed.  - Continue current diet of full liquids.  Inclusion body myositis/generalized weakness  - Patient has been seen by neurology and patient states some improvement in her weakness.  - At this point neurology recommended no steroids or immunosuppressive therapy. - Patient will need outpatient followup with her neurologist, Dr. [name] post discharge hopefully one week post discharge.   Gastroesophageal reflux disease  - PPI.   hypertension  Blood pressure medications on hold secondary to orthostasis. Normal saline lock IV fluids. Will start to resume home regimen in the next one to 2 days.   Procedures:  Ct HEAD c  Ct neck  Consultations:  neurology  Discharge Exam: Filed Vitals:   03/25/14 0658  BP: 152/84  Pulse: 91  Temp: 98.1 F (36.7 C)  Resp: 18    General: A&O x3 Cardiovascular: RRR Respiratory: good air movement CTA B/L  Discharge Instructions You were cared for by a hospitalist during your hospital stay. If you have any questions about your discharge medications or the care you received while you were in the hospital after you are discharged, you can call the unit and asked to speak with the hospitalist on call if the hospitalist that took care of you is not available. Once you are discharged, your primary care physician will handle any further medical issues.  Please note that NO REFILLS for any discharge medications will be authorized once you are discharged, as it is imperative that you return to your primary care physician (or establish a relationship with a primary care physician if you do not have one) for your aftercare needs so that they can reassess your need for  medications and monitor your lab values.  Discharge Instructions   Diet - low sodium heart healthy    Complete by:  As directed      Increase activity slowly    Complete by:  As directed             Medication List         amLODipine 5 MG tablet  Commonly known as:  NORVASC  Take 1 tablet (5 mg total) by mouth daily.     Cetirizine HCl 10 MG Caps  Take 1 capsule (10 mg total) by mouth daily.     chlorpheniramine 4 MG tablet  Commonly known as:  CHLOR-TRIMETON  Take 1 tablet (4 mg total) by mouth 2 (two) times daily as needed for allergies.     cloNIDine 0.1 MG tablet  Commonly known as:  CATAPRES  Take 1 tablet (0.1 mg total) by mouth 2 (two) times daily.     esomeprazole 40 MG capsule  Commonly known as:  NEXIUM  Take 1 capsule (40 mg total) by mouth daily before breakfast.     FLUoxetine 40 MG capsule  Commonly known as:  PROZAC  Take 40 mg by mouth daily.     HYDROcodone-acetaminophen 5-325 MG per tablet  Commonly known as:  NORCO/VICODIN  Take 1 tablet by mouth every 6 (six) hours as needed for pain.     latanoprost 0.005 % ophthalmic solution  Commonly known as:  XALATAN  Place 1 drop into the left eye at bedtime.     metoprolol 100 MG tablet  Commonly known as:  LOPRESSOR  Take 100 mg by mouth 2 (two) times daily.     multivitamin capsule  Take 1 capsule by mouth daily.     ondansetron 4 MG disintegrating tablet  Commonly known as:  ZOFRAN ODT  Take 1 tab every 6-8 hours as needed for nausea.     pantoprazole 40 MG tablet  Commonly known as:  PROTONIX  Take 1 tablet (40 mg total) by mouth daily.     spironolactone 50 MG tablet  Commonly known as:  ALDACTONE  Take 0.5 tablets (25 mg total) by mouth daily.     triamcinolone 55 MCG/ACT Aero nasal inhaler  Commonly known as:  NASACORT AQ  Place 1 spray into the nose 2 (two) times daily.       Allergies  Allergen Reactions  . Propoxyphene Napsylate Nausea Only  . Darvon   . Latex     Per  Patient latex=rash "hands look burned"       Follow-up Information   Follow up with [name], MD In 2 weeks. (hospital follow up)    Specialty:  Internal Medicine   Contact information:   [place] [address] [phone]        The results of significant diagnostics from this hospitalization (including imaging, microbiology, ancillary and laboratory) are listed below for reference.    Significant Diagnostic Studies: Dg Pelvis 1-2 Views  03/22/2014   CLINICAL DATA:  Fall today with low back and posterior pelvic pain.  EXAM: PELVIS - 1-2 VIEW  COMPARISON:  12/17/2011 lumbar spine radiographs.  FINDINGS: Both femoral heads are located. Osteoarthritis involves both hips mildly. Sacroiliac joints are symmetric. No acute fracture. Surgical staples in the pelvis.  IMPRESSION: No acute osseous abnormality.   Electronically Signed   By: Abigail Miyamoto M.D.   On: 03/22/2014 18:10   Ct Head Wo Contrast  03/22/2014   CLINICAL DATA:  [place].  Hit head.  EXAM: CT HEAD WITHOUT CONTRAST  CT CERVICAL SPINE WITHOUT CONTRAST  TECHNIQUE: Multidetector CT imaging of the head and cervical spine was performed following the standard protocol without intravenous contrast. Multiplanar CT image reconstructions of the cervical spine were also generated.  COMPARISON:  None.  FINDINGS: CT HEAD FINDINGS  The ventricles are normal in size and configuration. No extra-axial fluid collections are identified. The gray-white differentiation is normal. No CT findings for acute intracranial process such as hemorrhage or infarction. No mass lesions. The brainstem and cerebellum are grossly normal.  There is a scalp hematoma in the left occipital region. No underlying skull fracture. The bony structures are intact. The paranasal sinuses and mastoid air cells are clear. The globes are intact.  CT CERVICAL SPINE FINDINGS  Moderate degenerative cervical spondylosis with multilevel disc disease and facet disease. No acute  cervical spine fracture is identified. Moderate degenerative changes at C1-2. The C1-2 articulations are maintained. The dens is intact. No abnormal prevertebral soft tissue swelling. The facets are normally aligned. No facet or laminar fractures.  No large disc protrusions.  The lungs are grossly clear.  IMPRESSION: No acute intracranial findings or skull fracture.  Scalp hematoma in the left occipital region.  Degenerative cervical spondylosis but no acute cervical spine fracture.   Electronically Signed   By: Kalman Jewels M.D.   On: 03/22/2014 18:18   Ct Cervical Spine Wo Contrast  03/22/2014   CLINICAL DATA:  [place].  Hit head.  EXAM: CT HEAD WITHOUT CONTRAST  CT CERVICAL SPINE WITHOUT CONTRAST  TECHNIQUE: Multidetector CT imaging of the head and cervical spine was performed following the standard protocol without intravenous contrast. Multiplanar CT image reconstructions of the cervical spine were also generated.  COMPARISON:  None.  FINDINGS: CT HEAD FINDINGS  The ventricles are normal in size and configuration. No extra-axial fluid collections are identified. The gray-white differentiation is normal. No CT findings for acute intracranial process such as hemorrhage or infarction. No mass lesions. The brainstem and cerebellum are grossly normal.  There is a scalp hematoma in the left occipital region. No underlying skull fracture. The bony structures are intact. The paranasal sinuses and mastoid air cells are clear. The globes are intact.  CT CERVICAL SPINE FINDINGS  Moderate degenerative cervical spondylosis with multilevel disc disease and facet disease. No acute cervical spine fracture is identified. Moderate degenerative changes at C1-2. The C1-2 articulations are maintained. The dens is intact. No abnormal prevertebral soft tissue swelling. The facets are normally aligned. No facet or laminar fractures.  No large disc protrusions.  The lungs are grossly clear.  IMPRESSION: No acute intracranial  findings or skull fracture.  Scalp hematoma in the left occipital region.  Degenerative cervical spondylosis but no acute cervical spine fracture.   Electronically Signed   By: Kalman Jewels M.D.   On: 03/22/2014 18:18   Dg Swallowing Func-speech Pathology  03/23/2014   Lorre Nick, CCC-SLP     03/23/2014  3:16 PM Objective Swallowing Evaluation: Modified Barium Swallowing Study   Patient Details  Name: Heather [name] MRN: [id number] Date of Birth: 09/08/1940  Today's Date: 03/23/2014  Time: 1330-1400 SLP Time Calculation (min): 30 min  Past Medical History:  Past Medical History  Diagnosis Date  . Depression     Controlled with SSRI  . HTN (hypertension)   . GERD (gastroesophageal reflux disease)     Chronic PPI's  . S/P dilatation of esophageal stricture     7/12 Dr Carlean Purl. Dilation of tight stricture proximal eso.   Prior dilation 09 and 08.  . Seasonal allergies     Zyrtec D PRN  . NASH (nonalcoholic steatohepatitis)     Per [place] 10/10  . History of PSVT (paroxysmal supraventricular tachycardia)     Remote and self limited.  . Gout     No crystal dx. R podegra x 3 episodes around 2010. Did not take  the allopurinol that was rec 2011.  Marland Kitchen LGSIL (low grade squamous intraepithelial lesion) on Pap smear  02/06/07    HPV Gyn 8/08 negative  . Chronic venous insufficiency     Present for years. Norvasc D/C'd 2012 thinking it might be  contributing.  . Migraines   . Glaucoma   . Neuropathy   . Back pain   . Muscle weakness   . Polymyositis   . 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 Cholecystectomy    . Tubal ligation    . Laparoscopic burch procedure  04/02/07    Burch colposuspension. Inadvertant bladder damage - bladder  repair, left ureteral stent placement, .  . Knee arthroscopy    . Abdominal hysterectomy  1980    2/2 menorrhagia  . Colonoscopy  09/28/2006    diverticulosis  .  Esophagogastroduodenoscopy  09/28/2006; 07/17/2008    dilation of proximal stenosis/cricopharyngeal achalasais both  times, occult ring, ? dyspmotility of esophagus  . Bladder repair    . Eye surgery      cataracts, glaucoma  . Muscle biopsy Right 11/18/2012    Procedure: right vastus lateralis muscle biopsy ;  Surgeon:  Haywood Lasso, MD;  Location: [facility];  Service: General;   Laterality: Right;  . Esophagogastroduodenoscopy N/A 05/06/2013    Procedure: ESOPHAGOGASTRODUODENOSCOPY (EGD) Probable Venia Minks.;   Surgeon: Jerene Bears, MD;  Location: [facility];  Service:  Gastroenterology;  Laterality: N/A;   HPI:  73 year old female admitted 03/22/14 due to weakness and fall. PMH  significant for dysphagia secondary to myositis and esophageal  stricture, GERD. BSE ordered to evaluate swallow function and  safety as decisions are being made when/if to place PEG tube.  After discussion with Dr. Grandville Silos and pt, it was decided to  proceed with BSE     Assessment / Plan / Recommendation Clinical Impression  Dysphagia Diagnosis: Severe pharyngeal phase dysphagia Pt  exhibits continued severe pharyngeal motor and sensory based  dysphagia, similar in presentation to MBS report from October  2014. Still noted are nearly absent tongue base retraction and  epiglottic deflection, poor laryngeal elevation, and reduced  pharyngeal contraction.  The swallow is significantly delayed,  with posterior spill to the pyriform sinus across consistencies.  Post-swallow residue is seen on all consistencies tested, and was  noted to increase in amount as the thickness increased. Pt able  to take liquid wash of water and clear vallecular and pyriform  residue.  Of note, NO penetration or aspiration was observed with  any consistency tested, despite bolus size or rate. At this time,  full liquid diet  is recommended for pt, with thin, nectar, and  honey thick liquid consistencies allowable. Decisions are pending  regarding placement of PEG  tube. It is the opinion of this SLP.  after discussion with pt following MBS, that PEG placement would  provide her with adequate nutrition and hydration, allowing po  intake to be purely oral satisfaction.  Pt indicated she wanted  fried chicken - Pt was encouraged when SLP explained how she  could have anything she desires, as long as it has been processed  thoroughly in a blender, and thinned to either a honey, nectar,  or thin consistency using an appropriate medium (milk, butter,  water, sauce, etc). SLP provided several examples of how this has  been done for a similar situation.  Home health ST is strongly  recommended to allow pt to have therapy in her home environment  for education re: how to achieve the proper consistency.    Treatment Recommendation  Therapy as outlined in treatment plan below    Diet Recommendation Honey-thick liquid;Nectar-thick liquid;Thin  liquid (full liquid)   Liquid Administration via: Spoon;Cup;Straw Medication Administration: Via alternative means Supervision: Patient able to self feed Compensations: Slow rate;Small sips/bites Postural Changes and/or Swallow Maneuvers: Seated upright 90  degrees    Other  Recommendations Recommended Consults: MBS Oral Care Recommendations: Oral care BID   Follow Up Recommendations  Home health SLP    Frequency and Duration min 1 x/week  1 week   Pertinent Vitals/Pain VSS, headache reported    SLP Swallow Goals   see care plan   General Date of Onset: 03/22/14 HPI: 73 year old female admitted 03/22/14 due to weakness and  fall. PMH significant for dysphagia secondary to myositis and  esophageal stricture, GERD. BSE ordered to evaluate swallow  function and safety as decisions are being made when/if to place  PEG tube. After discussion with Dr. Grandville Silos and pt, it was  decided to proceed with BSE Type of Study: Modified Barium Swallowing Study Reason for Referral: Objectively evaluate swallowing function Previous Swallow Assessment: MBS 05/2013  revealed severe  pharyngeal and severe esophageal dysphagia. Diet Prior to this Study: Thin liquids Temperature Spikes Noted: No Respiratory Status: Room air History of Recent Intubation: No Behavior/Cognition: Alert;Pleasant mood;Cooperative Oral Cavity - Dentition: Adequate natural dentition Oral Motor / Sensory Function: Within functional limits Self-Feeding Abilities: Able to feed self Patient Positioning: Upright in chair Baseline Vocal Quality: Wet Volitional Cough: Weak Volitional Swallow: Unable to elicit Anatomy: Within functional limits Pharyngeal Secretions: Not observed secondary MBS    Reason for Referral Objectively evaluate swallowing function   Oral Phase Oral Preparation/Oral Phase Oral Phase: WFL   Pharyngeal Phase Pharyngeal Phase Pharyngeal Phase: Impaired Pharyngeal - Honey Pharyngeal - Honey Teaspoon: Delayed swallow initiation;Premature  spillage to valleculae;Premature spillage to pyriform  sinuses;Reduced pharyngeal peristalsis;Reduced epiglottic  inversion;Reduced anterior laryngeal mobility;Reduced laryngeal  elevation;Reduced tongue base retraction;Pharyngeal residue -  valleculae;Pharyngeal residue - pyriform sinuses Pharyngeal - Nectar Pharyngeal - Nectar Cup: Delayed swallow initiation;Premature  spillage to valleculae;Premature spillage to pyriform  sinuses;Reduced laryngeal elevation;Reduced anterior laryngeal  mobility;Reduced pharyngeal peristalsis;Reduced epiglottic  inversion;Reduced tongue base retraction;Pharyngeal residue -  valleculae;Pharyngeal residue - pyriform sinuses Pharyngeal - Nectar Straw: Reduced pharyngeal peristalsis;Reduced  tongue base retraction;Reduced epiglottic inversion;Delayed  swallow initiation;Reduced anterior laryngeal mobility;Premature  spillage to valleculae;Reduced laryngeal elevation;Premature  spillage to pyriform sinuses;Pharyngeal residue - pyriform  sinuses;Pharyngeal residue - valleculae Pharyngeal - Thin Pharyngeal - Thin Teaspoon: Delayed  swallow initiation;Premature  spillage to valleculae;Premature spillage to  pyriform  sinuses;Reduced laryngeal elevation;Reduced anterior laryngeal  mobility;Reduced epiglottic inversion;Reduced pharyngeal  peristalsis;Reduced tongue base retraction;Pharyngeal residue -  valleculae;Pharyngeal residue - pyriform sinuses Pharyngeal - Thin Cup: Delayed swallow initiation;Premature  spillage to valleculae;Premature spillage to pyriform  sinuses;Reduced laryngeal elevation;Reduced anterior laryngeal  mobility;Reduced epiglottic inversion;Reduced pharyngeal  peristalsis;Reduced tongue base retraction;Pharyngeal residue -  valleculae;Pharyngeal residue - pyriform sinuses Pharyngeal - Thin Straw: Delayed swallow initiation;Premature  spillage to valleculae;Premature spillage to pyriform  sinuses;Reduced laryngeal elevation;Pharyngeal residue - pyriform  sinuses;Pharyngeal residue - valleculae;Reduced anterior  laryngeal mobility;Reduced epiglottic inversion;Reduced  pharyngeal peristalsis;Reduced tongue base retraction  Cervical Esophageal Phase    GO   Celia B. Quentin Ore [facility], [place] [phone] Cervical Esophageal Phase Cervical Esophageal Phase:  (not visualized)         Shonna Chock 03/23/2014, 3:01 PM     Microbiology: No results found for this or any previous visit (from the past 240 hour(s)).   Labs: Basic Metabolic Panel:  Recent Labs Lab 03/22/14 2049 03/23/14 0500 03/24/14 0552 03/25/14 0507  NA 131* 134* 136* 136*  K 3.6* 3.2* 4.4 4.0  CL 90* 97 102 98  CO2 28 27 24 27   GLUCOSE 139* 111* 91 98  BUN 9 8 5* 4*  CREATININE 0.45* 0.40* 0.36* 0.35*  CALCIUM 10.2 9.0 9.0 9.6  MG  --  1.8  --   --    Liver Function Tests:  Recent Labs Lab 03/22/14 2049 03/23/14 0500  AST 34 27  ALT 34 28  ALKPHOS 74 62  BILITOT 0.3 0.4  PROT 7.9 7.0  ALBUMIN 4.1 3.4*   No results found for this basename: LIPASE, AMYLASE,  in the last 168 hours No results found for this basename: AMMONIA,   in the last 168 hours CBC:  Recent Labs Lab 03/22/14 2049 03/23/14 0500 03/24/14 0552  WBC 12.4* 8.0 5.7  NEUTROABS 7.9*  --   --   HGB 12.8 11.7* 11.8*  HCT 35.3* 32.7* 33.9*  MCV 94.9 94.5 97.1  PLT PLATELET CLUMPS NOTED ON SMEAR, COUNT APPEARS ADEQUATE 281 274   Cardiac Enzymes:  Recent Labs Lab 03/22/14 2047  CKTOTAL 499*   BNP: BNP (last 3 results) No results found for this basename: PROBNP,  in the last 8760 hours CBG: No results found for this basename: GLUCAP,  in the last 168 hours     Signed:  Charlynne Cousins  [facility] 03/25/2014, 10:15 AM

## 2014-03-25 NOTE — Progress Notes (Signed)
Pt transferred to facility via EMS.

## 2014-03-25 NOTE — Progress Notes (Signed)
Patient is set to discharge to [facility] today. [employer] Medicare insurance authorization obtained. Patient aware & let son, [name] know. [facility] called for transport - discharge packet in [place], [employer] aware.   Clinical Social Work Department CLINICAL SOCIAL WORK PLACEMENT NOTE 03/25/2014  Patient:  Heather [name], Heather [name]  Account Number:  [id number] Admit date:  03/22/2014  Clinical Social Worker:  Sindy Messing, LCSW  Date/time:  03/23/2014 04:00 PM  Clinical Social Work is seeking post-discharge placement for this patient at the following level of care:   [place]   (*CSW will update this form in Epic as items are completed)   03/23/2014  Patient/family provided with [place] Department of Clinical Social Work's list of facilities offering this level of care within the geographic area requested by the patient (or if unable, by the patient's family).  03/23/2014  Patient/family informed of their freedom to choose among providers that offer the needed level of care, that participate in Medicare, Medicaid or managed care program needed by the patient, have an available bed and are willing to accept the patient.  03/23/2014  Patient/family informed of [facility]' ownership interest in [facility], as well as of the fact that they are under no obligation to receive care at this facility.  PASARR submitted to EDS on 03/23/2014 PASARR number received on 03/23/2014  FL2 transmitted to all facilities in geographic area requested by pt/family on  03/23/2014 FL2 transmitted to all facilities within larger geographic area on   Patient informed that his/her managed care company has contracts with or will negotiate with  certain facilities, including the following:     Patient/family informed of bed offers received:  03/24/2014 Patient chooses bed at [facility] Physician recommends and patient chooses bed at    Patient to be transferred to [facility] on  03/25/2014 Patient to be transferred to facility by [facility] Patient and family notified of transfer on 03/25/2014 Name of family member notified:  patient made son, [name] aware  The following physician request were entered in Epic:   Additional Comments:   Raynaldo Opitz, [place] Social Worker cell #: [phone]

## 2014-03-25 NOTE — Progress Notes (Signed)
Physical Therapy Treatment Patient Details Name: Heather [name] MRN: [id number] DOB: 12/01/1940 Today's Date: 03/25/2014    History of Present Illness Heather [name]  is a 73 y.o. female, admitted 03/22/14 after slumping over in  [facility] in ED, which she came to after a fall at home. Patient with known history of inclusion body myositis, with known generalized weakness progressing over a few months, where she ambulates at baseline with a walker, and for longer distance she needs wheelchair ,as well progressive dysphagia, esophageal stenosis; PEG tube plans were discussed with neurology.    PT Comments    Pt progressing, recommend +2 assist for safety and to incr mobility/amb next session; Pt will benefit from SNF, she continues to fatigue quickly   Follow Up Recommendations  SNF;Supervision/Assistance - 24 hour     Equipment Recommendations  None recommended by PT    Recommendations for Other Services OT consult     Precautions / Restrictions Precautions Precautions: Fall Precaution Comments: knees may buckle    Mobility  Bed Mobility Overal bed mobility: Needs Assistance Bed Mobility: Supine to Sit     Supine to sit: HOB elevated;Min guard     General bed mobility comments: extra time to move to sitting, able to get trunk upright as pt long sitting in the bed  Transfers Overall transfer level: Needs assistance Equipment used: Rolling walker (2 wheeled);None Transfers: Sit to/from [employer] to Stand: Min assist;Mod assist Stand pivot transfers: Min assist;Mod assist       General transfer comment: secure L knee to prevent buckling during pivot, pt did stand with 1 hand on RW and performed pericare.  Ambulation/Gait Ambulation/Gait assistance: Min assist (really needs +2 for safety) Ambulation Distance (Feet): 18 Feet Assistive device: Rolling walker (2 wheeled) Gait Pattern/deviations: Step-to pattern     General Gait Details: cues  for safety, RW position, pt locks knees in hypertension  to maintain upright    Stairs            Wheelchair Mobility    Modified Rankin (Stroke Patients Only)       Balance Overall balance assessment: History of Falls;Needs assistance         Standing balance support: Bilateral upper extremity supported;During functional activity Standing balance-Leahy Scale: Poor                      Cognition Arousal/Alertness: Awake/alert Behavior During Therapy: WFL for tasks assessed/performed Overall Cognitive Status: Within Functional Limits for tasks assessed                      Exercises General Exercises - Lower Extremity Long Arc Quad: AROM;Strengthening;Both;AAROM;10 reps;Seated Hip Flexion/Marching: AROM;Strengthening;Both;10 reps;Seated    General Comments        Pertinent Vitals/Pain Pain Assessment: No/denies pain    Home Living                      Prior Function            PT Goals (current goals can now be found in the care plan section) Acute Rehab PT Goals Patient Stated Goal: I need to get stronger and take care of myself. PT Goal Formulation: With patient Time For Goal Achievement: 04/06/14 Potential to Achieve Goals: Good Progress towards PT goals: Progressing toward goals    Frequency  Min 3X/week    PT Plan Current plan remains appropriate    Co-evaluation  End of Session Equipment Utilized During Treatment: Gait belt Activity Tolerance: Patient tolerated treatment well Patient left: in chair;with call bell/phone within reach     Time: 1422-1451 PT Time Calculation (min): 29 min  Charges:  $Gait Training: 8-22 mins $Therapeutic Activity: 8-22 mins                    G Codes:      Jagdeep Ancheta 2014-04-19, 3:21 PM

## 2014-03-26 ENCOUNTER — Other Ambulatory Visit: Payer: Self-pay | Admitting: *Deleted

## 2014-03-26 MED ORDER — HYDROCODONE-ACETAMINOPHEN 5-325 MG PO TABS: ORAL_TABLET | ORAL | Status: DC

## 2014-03-26 NOTE — Telephone Encounter (Signed)
[facility] 

## 2014-03-27 NOTE — Telephone Encounter (Addendum)
Spoke to patient's son Heather [name] and he is coming to fill out a designated party release since his mother is in rehab.  She has apparently missed her appointment with Heather [name] and also has not had a feeding tube placed.    Also spoke to patient and she wants to get rescheduled with Heather [name] and also get the consult scheduled with the Heather [name].  She wants to have her son notified of the appointments also so she won't miss them again.  She doesn't know how long she will be in rehab, but I relayed that I will speak with Heather [name] and see what has to be done to have these appointments rescheduled.  Her cell number is [phone] and son Heather [name] cell [phone]

## 2014-03-30 ENCOUNTER — Non-Acute Institutional Stay (SKILLED_NURSING_FACILITY): Payer: Medicare PPO | Admitting: Internal Medicine

## 2014-03-30 DIAGNOSIS — I10 Essential (primary) hypertension: Secondary | ICD-10-CM

## 2014-03-30 DIAGNOSIS — K219 Gastro-esophageal reflux disease without esophagitis: Secondary | ICD-10-CM

## 2014-03-30 DIAGNOSIS — F3289 Other specified depressive episodes: Secondary | ICD-10-CM

## 2014-03-30 DIAGNOSIS — G7241 Inclusion body myositis [IBM]: Secondary | ICD-10-CM

## 2014-03-30 DIAGNOSIS — F329 Major depressive disorder, single episode, unspecified: Secondary | ICD-10-CM

## 2014-03-31 NOTE — Progress Notes (Signed)
HISTORY & PHYSICAL  DATE: 03/30/2014   FACILITY: [facility]  LEVEL OF CARE: SNF (31)  ALLERGIES:  Allergies  Allergen Reactions  . Propoxyphene Napsylate Nausea Only  . Darvon   . Latex     Per Patient latex=rash "hands look burned"    CHIEF COMPLAINT:  Manage hypertension, GERD and depression  HISTORY OF PRESENT ILLNESS: 73 year old African American female was hospitalized secondary to a presyncopal episode. After hospitalization she is admitted to this facility for short-term rehabilitation.  HTN: Pt 's HTN remains stable.  Denies CP, sob, DOE, pedal edema, headaches, dizziness or visual disturbances.  No complications from the medications currently being used.  Last BP : 120/60.  GERD: pt's GERD is stable.  Denies ongoing heartburn, abd. Pain, nausea or vomiting.  Currently on a PPI & tolerates it without any adverse reactions.  DEPRESSION: The depression remains stable. Patient denies ongoing feelings of sadness, insomnia, anedhonia or lack of appetite. No complications reported from the medications currently being used. Staff do not report behavioral problems.  PAST MEDICAL HISTORY :  Past Medical History  Diagnosis Date  . Depression     Controlled with SSRI  . HTN (hypertension)   . GERD (gastroesophageal reflux disease)     Chronic PPI's  . S/P dilatation of esophageal stricture     7/12 Dr Carlean Purl. Dilation of tight stricture proximal eso.  Prior dilation 09 and 08.  . Seasonal allergies     Zyrtec D PRN  . NASH (nonalcoholic steatohepatitis)     Per [place] 10/10  . History of PSVT (paroxysmal supraventricular tachycardia)     Remote and self limited.  . Gout     No crystal dx. R podegra x 3 episodes around 2010. Did not take the allopurinol that was rec 2011.  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 Chronic venous insufficiency     Present for years. Norvasc D/C'd 2012  thinking it might be contributing.  . Migraines   . Glaucoma   . Neuropathy   . Back pain   . Muscle weakness   . Polymyositis   . 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 Cholecystectomy    . Tubal ligation    . Laparoscopic burch procedure  04/02/07    Burch colposuspension. Inadvertant bladder damage - bladder repair, left ureteral stent placement, .  . Knee arthroscopy    . Abdominal hysterectomy  1980    2/2 menorrhagia  . Colonoscopy  09/28/2006    diverticulosis  . 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 dyspmotility of esophagus  . Bladder repair    . Eye surgery      cataracts, glaucoma  . Muscle biopsy Right 11/18/2012    Procedure: right vastus lateralis muscle biopsy ;  Surgeon: Haywood Lasso, MD;  Location: [facility];  Service: General;  Laterality: Right;  . Esophagogastroduodenoscopy N/A 05/06/2013    Procedure: ESOPHAGOGASTRODUODENOSCOPY (EGD) Probable Venia Minks.;  Surgeon: Jerene Bears, MD;  Location: [facility];  Service: Gastroenterology;  Laterality: N/A;    SOCIAL HISTORY:  reports that she quit smoking about 45 years ago. Her smoking use included Cigarettes. She smoked 0.00 packs per day. She has never used smokeless  tobacco. She reports that she does not drink alcohol or use illicit drugs.  FAMILY HISTORY:  Family History  Problem Relation Age of Onset  . Heart disease Mother   . Hypertension Mother   . Heart attack Mother   . Heart disease Father   . Hypertension Father   . Stroke Father   . Heart disease Sister   . Hypertension Sister   . Throat cancer Brother   . Hypertension Brother   . Heart disease Brother   . Colon cancer Neg Hx     CURRENT MEDICATIONS: Reviewed per MAR/see medication list  REVIEW OF SYSTEMS:  See HPI  otherwise 14 point ROS is negative.  PHYSICAL EXAMINATION  VS:  See VS section  GENERAL: no acute distress, normal body habitus EYES: conjunctivae normal, sclerae normal, normal eye lids MOUTH/THROAT: lips without lesions,no lesions in the mouth,tongue is without lesions,uvula elevates in midline NECK: supple, trachea midline, no neck masses, no thyroid tenderness, no thyromegaly LYMPHATICS: no LAN in the neck, no supraclavicular LAN RESPIRATORY: breathing is even & unlabored, BS CTAB CARDIAC: RRR, no murmur,no extra heart sounds, no edema GI:  ABDOMEN: abdomen soft, normal BS, no masses, no tenderness  LIVER/SPLEEN: no hepatomegaly, no splenomegaly MUSCULOSKELETAL: HEAD: normal to inspection  EXTREMITIES: LEFT UPPER EXTREMITY: full range of motion, decreased strength & tone RIGHT UPPER EXTREMITY:  full range of motion, decreased strength & tone LEFT LOWER EXTREMITY:  Minimal range of motion, decreased strength & tone RIGHT LOWER EXTREMITY:  Minimal range of motion, decreased strength & tone PSYCHIATRIC: the patient is alert & oriented to person, affect & behavior appropriate  LABS/RADIOLOGY:  Labs reviewed: Basic Metabolic Panel:  Recent Labs  03/23/14 0500 03/24/14 0552 03/25/14 0507  NA 134* 136* 136*  K 3.2* 4.4 4.0  CL 97 102 98  CO2 27 24 27   GLUCOSE 111* 91 98  BUN 8 5* 4*  CREATININE 0.40* 0.36* 0.35*  CALCIUM 9.0 9.0 9.6  MG 1.8  --   --    Liver Function Tests:  Recent Labs  03/22/14 2049 03/23/14 0500  AST 34 27  ALT 34 28  ALKPHOS 74 62  BILITOT 0.3 0.4  PROT 7.9 7.0  ALBUMIN 4.1 3.4*   CBC:  Recent Labs  12/19/13 1444 03/22/14 2049 03/23/14 0500 03/24/14 0552  WBC 7.3 12.4* 8.0 5.7  NEUTROABS 3.0 7.9*  --   --   HGB 11.7* 12.8 11.7* 11.8*  HCT 34.6* 35.3* 32.7* 33.9*  MCV 102.6* 94.9 94.5 97.1  PLT 282.0 PLATELET CLUMPS NOTED ON SMEAR, COUNT APPEARS ADEQUATE 281 274   Cardiac Enzymes:  Recent Labs  12/19/13 1444 03/22/14 2047    CKTOTAL 773* 499*    Carotid Duplex Study  Patient:    Malachi, Suderman MR #:       [id number] Study Date: 03/23/2014 Gender:     F Age:        47 Height: Weight: BSA: Pt. Status: Room:       Lonsdale, RVT  ADMITTING    Elgergawy, Dawood S  ORDERING     Elgergawy, Dawood S  ATTENDING    Charlynne Cousins  Reports also to:  ------------------------------------------------------------------- History and indications:  Indications  780.2 Syncope and collapse.  History  Diagnostic evaluation. Pre syncope while in ED for mechanical fall.   ------------------------------------------------------------------- Study information:  Study status:  Routine.  Procedure:  The right CCA, right ECA, right ICA, right vertebral, left  CCA, left ECA, left ICA, and left vertebral arteries were examined. A vascular evaluation was performed with the patient in the supine position.    Carotid duplex study.     Carotid Duplex exam including 2D imaging, color and spectral Doppler were performed on the extracranial carotid and vertebral arteries using standard established protocols. Birthdate:  Patient birthdate: 1941/04/30.  Age:  Patient is 73 yr old.  Sex:  Gender: female.  Study date:  Study date: 03/23/2014. Study time: 11:48 AM.  Location:  Bedside.  Patient status: Inpatient.  Arterial flow:  +------------------+---------+-------+-------------+--------------+ Location          V sys    V ed   Flow analysisComment        +------------------+---------+-------+-------------+--------------+ Right CCA -       114 cm/s 12 cm/s--------------------------- proximal                                                      +------------------+---------+-------+-------------+--------------+ Right CCA - distal92 cm/s  15 cm/s--------------------------- +------------------+---------+-------+-------------+--------------+ Right ECA          -112 cm/s0 cm/s --------------------------- +------------------+---------+-------+-------------+--------------+ Right ICA -       84 cm/s  17 cm/s-------------Minimal        proximal                                       plaque.        +------------------+---------+-------+-------------+--------------+ Right ICA - distal-63 cm/s -21    ---------------------------                            cm/s                               +------------------+---------+-------+-------------+--------------+ Right vertebral   -83 cm/s -20    Antegrade    --------------                            cm/s   flow                        +------------------+---------+-------+-------------+--------------+ Left CCA -        125 cm/s 14 cm/s--------------------------- proximal                                                      +------------------+---------+-------+-------------+--------------+ Left CCA - distal 108 cm/s 21 cm/s--------------------------- +------------------+---------+-------+-------------+--------------+ Left ECA          -156 cm/s0 cm/s --------------------------- +------------------+---------+-------+-------------+--------------+ Left ICA -        -115 cm/s-22    -------------Minimal        proximal                   cm/s                plaque.        +------------------+---------+-------+-------------+--------------+ Left ICA - distal -116 cm/s-31    ---------------------------  cm/s                               +------------------+---------+-------+-------------+--------------+ Left vertebral    56 cm/s  13 cm/sAntegrade    --------------                                   flow                        +------------------+---------+-------+-------------+--------------+  ------------------------------------------------------------------- Summary:  - The vertebral  arteries appear patent with antegrade flow. - Findings consistent with 1-39 percent stenosis involving the   right internal carotid artery and the left internal carotid   artery. PELVIS - 1-2 VIEW   COMPARISON:  12/17/2011 lumbar spine radiographs.   FINDINGS: Both femoral heads are located. Osteoarthritis involves both hips mildly. Sacroiliac joints are symmetric. No acute fracture. Surgical staples in the pelvis.   IMPRESSION: No acute osseous abnormality CT HEAD WITHOUT CONTRAST   CT CERVICAL SPINE WITHOUT CONTRAST   TECHNIQUE: Multidetector CT imaging of the head and cervical spine was performed following the standard protocol without intravenous contrast. Multiplanar CT image reconstructions of the cervical spine were also generated.   COMPARISON:  None.   FINDINGS: CT HEAD FINDINGS   The ventricles are normal in size and configuration. No extra-axial fluid collections are identified. The gray-white differentiation is normal. No CT findings for acute intracranial process such as hemorrhage or infarction. No mass lesions. The brainstem and cerebellum are grossly normal.   There is a scalp hematoma in the left occipital region. No underlying skull fracture. The bony structures are intact. The paranasal sinuses and mastoid air cells are clear. The globes are intact.   CT CERVICAL SPINE FINDINGS   Moderate degenerative cervical spondylosis with multilevel disc disease and facet disease. No acute cervical spine fracture is identified. Moderate degenerative changes at C1-2. The C1-2 articulations are maintained. The dens is intact. No abnormal prevertebral soft tissue swelling. The facets are normally aligned. No facet or laminar fractures.   No large disc protrusions.   The lungs are grossly clear.   IMPRESSION: No acute intracranial findings or skull fracture.   Scalp hematoma in the left occipital region.   Degenerative cervical spondylosis but no acute  cervical spine fracture.    ASSESSMENT/PLAN:  Hypertension-well-controlled GERD-continue PPI Depression-denies ongoing symptoms. Continue Prozac. Inclusion body myositis-followed by neurologist. Dysphagia-secondary to worsening inclusion body myositis. Scheduled for possible PEG tube placement. Allergic rhinitis-well-controlled Check CBC and BMP  I have reviewed patient's medical records received at admission/from hospitalization.  CPT CODE: 74259  Shaquila Sigman Y Derec Mozingo, [place] [phone]

## 2014-04-01 ENCOUNTER — Non-Acute Institutional Stay (SKILLED_NURSING_FACILITY): Payer: Medicare PPO | Admitting: Internal Medicine

## 2014-04-01 DIAGNOSIS — D7589 Other specified diseases of blood and blood-forming organs: Secondary | ICD-10-CM

## 2014-04-01 DIAGNOSIS — J301 Allergic rhinitis due to pollen: Secondary | ICD-10-CM

## 2014-04-01 DIAGNOSIS — D638 Anemia in other chronic diseases classified elsewhere: Secondary | ICD-10-CM

## 2014-04-01 NOTE — Telephone Encounter (Signed)
Called and spoke to son and his mother will be in Rehab for a while. Called [facility] and let them no Patient missed her appt. Because she is in Rehab. Patient's son will call me back when he want's me to get her rescheduled.

## 2014-04-08 NOTE — Progress Notes (Signed)
Patient ID: MADAI NUCCIO, female   DOB: 01-01-1941, 73 y.o.   MRN: [id number]           PROGRESS NOTE  DATE: 04/01/2014         FACILITY:  [facility]  LEVEL OF CARE: SNF (31)  Acute Visit  CHIEF COMPLAINT:  Manage anemia of chronic disease and allergic rhinitis.    HISTORY OF PRESENT ILLNESS: I was requested by the staff to assess the patient regarding above problem(s):  ANEMIA: The anemia is unstable. The patient denies fatigue, melena or hematochezia. No complications from the medications currently being used.  On 03/31/2014:  Hemoglobin 11, MCV 101.  Prior hemoglobins are 11.8, 11.7, and 12.8.    ALLERGIC RHINITIS: Allergic rhinitis is unstable.  Patient is complaining of coughing and congestion, not relieved by current Zyrtec.  Says that she was on Claritin at home.  Patient denies ongoing symptoms such as runny nose, sneezing or tearing. No complications reported from the current medication(s) being used.     PAST MEDICAL HISTORY : Reviewed.  No changes/see problem list  CURRENT MEDICATIONS: Reviewed per MAR/see medication list  REVIEW OF SYSTEMS:  GENERAL: no change in appetite, no fatigue, no weight changes, no fever, chills or weakness RESPIRATORY: cough and congestion, no SOB, DOE,, wheezing, hemoptysis CARDIAC: no chest pain, edema or palpitations GI: no abdominal pain, diarrhea, constipation, heart burn, nausea or vomiting  PHYSICAL EXAMINATION  VS: see VS section  GENERAL: no acute distress, normal body habitus EYES: conjunctivae normal, sclerae normal, normal eye lids NECK: supple, trachea midline, no neck masses, no thyroid tenderness, no thyromegaly LYMPHATICS: no LAN in the neck, no supraclavicular LAN       RESPIRATORY: breathing is even & unlabored, BS CTAB CARDIAC: RRR, no murmur,no extra heart sounds, no edema GI: abdomen soft, normal BS, no masses, no tenderness, no hepatomegaly, no splenomegaly PSYCHIATRIC: the patient is  alert & oriented to person, affect & behavior appropriate  ASSESSMENT/PLAN:  Allergic rhinitis.  Uncontrolled.  Start Claritin 10 mg q.d.    Anemia of chronic disease.  Unstable problem.  Hemoglobin has declined.  Recheck in one week.    Macrocytosis.  Check RBC folate and vitamin B12 level.    CPT CODE: 09983           Matti Killingsworth Y Balen Woolum, [place] [phone]

## 2014-04-09 DIAGNOSIS — D638 Anemia in other chronic diseases classified elsewhere: Secondary | ICD-10-CM | POA: Insufficient documentation

## 2014-04-20 ENCOUNTER — Non-Acute Institutional Stay (SKILLED_NURSING_FACILITY): Payer: Medicare PPO | Admitting: Internal Medicine

## 2014-04-20 ENCOUNTER — Other Ambulatory Visit: Payer: Self-pay | Admitting: *Deleted

## 2014-04-20 DIAGNOSIS — M109 Gout, unspecified: Secondary | ICD-10-CM

## 2014-04-20 DIAGNOSIS — G4700 Insomnia, unspecified: Secondary | ICD-10-CM

## 2014-04-20 MED ORDER — ZOLPIDEM TARTRATE 5 MG PO TABS: ORAL_TABLET | ORAL | Status: DC

## 2014-04-20 NOTE — Telephone Encounter (Signed)
[facility] 

## 2014-04-22 ENCOUNTER — Non-Acute Institutional Stay (SKILLED_NURSING_FACILITY): Payer: Medicare PPO | Admitting: Internal Medicine

## 2014-04-22 DIAGNOSIS — K219 Gastro-esophageal reflux disease without esophagitis: Secondary | ICD-10-CM

## 2014-04-22 DIAGNOSIS — G7241 Inclusion body myositis [IBM]: Secondary | ICD-10-CM

## 2014-04-22 DIAGNOSIS — J301 Allergic rhinitis due to pollen: Secondary | ICD-10-CM

## 2014-04-22 DIAGNOSIS — I10 Essential (primary) hypertension: Secondary | ICD-10-CM

## 2014-04-24 NOTE — Progress Notes (Signed)
Patient ID: Heather [name], female   DOB: [birth year], 73 y.o.   MRN: [id number]           PROGRESS NOTE  DATE: 04/20/2014          FACILITY:  [facility]  LEVEL OF CARE: SNF (31)  Acute Visit  CHIEF COMPLAINT:  Manage right foot pain and insomnia.    HISTORY OF PRESENT ILLNESS: I was requested by the staff to assess the patient regarding above problem(s):  RIGHT FOOT PAIN:  Patient is complaining of pain in her right foot for several days.   She says she has a history of gout.  She would like to have treatment for gout.    INSOMNIA:  New problem.  Patient is complaining of not being able to sleep at night.  She denies hallucinations or delusions.  She is requesting a sleep aid.    PAST MEDICAL HISTORY : Reviewed.  No changes/see problem list  CURRENT MEDICATIONS: Reviewed per MAR/see medication list  REVIEW OF SYSTEMS:  GENERAL: no change in appetite, no fatigue, no weight changes, no fever, chills or weakness RESPIRATORY: no cough, SOB, DOE,, wheezing, hemoptysis CARDIAC: no chest pain, edema or palpitations GI: no abdominal pain, diarrhea, constipation, heart burn, nausea or vomiting  PHYSICAL EXAMINATION  VS: see VS section  GENERAL: no acute distress, normal body habitus EYES: conjunctivae normal, sclerae normal, normal eye lids NECK: supple, trachea midline, no neck masses, no thyroid tenderness, no thyromegaly LYMPHATICS: no LAN in the neck, no supraclavicular LAN RESPIRATORY: breathing is even & unlabored, BS CTAB CARDIAC: RRR, no murmur,no extra heart sounds, no edema GI: abdomen soft, normal BS, no masses, no tenderness, no hepatomegaly, no splenomegaly PSYCHIATRIC: the patient is alert & oriented to person, affect & behavior appropriate MUSCULOSKELETAL: right foot tender to palpation but there is no warmth, edema, or erythema       ASSESSMENT/PLAN:  Right foot gout.  New problem.  Start colchicine 0.6 mg b.i.d. for one week.     Insomnia.  New problem.  Start Ambien 5 mg q.h.s. p.r.n.     CPT CODE: 78938             Gayani Y Dasanayaka, [place] [phone]

## 2014-04-25 DIAGNOSIS — I10 Essential (primary) hypertension: Secondary | ICD-10-CM

## 2014-04-25 DIAGNOSIS — M1610 Unilateral primary osteoarthritis, unspecified hip: Secondary | ICD-10-CM

## 2014-04-25 DIAGNOSIS — M47812 Spondylosis without myelopathy or radiculopathy, cervical region: Secondary | ICD-10-CM

## 2014-04-25 DIAGNOSIS — M3320 Polymyositis, organ involvement unspecified: Secondary | ICD-10-CM

## 2014-04-29 NOTE — Progress Notes (Signed)
Patient ID: Heather [name], female   DOB: [birth year], 73 y.o.   MRN: [id number]                PROGRESS NOTE  DATE: 04/22/2014          FACILITY: [facility]  LEVEL OF CARE: SNF (31)  Discharge Visit  CHIEF COMPLAINT:  Manage hypertension and inclusion body myositis.    HISTORY OF PRESENT ILLNESS: I was requested by the social worker to perform face-to-face evaluation for discharge:  Patient was admitted to this facility for short-term rehabilitation after the patient's recent hospitalization.  Patient has completed SNF rehabilitation and therapy has cleared the patient for discharge.  Reassessment of ongoing problem(s):  HTN: Pt 's HTN remains stable.  Denies CP, sob, DOE, pedal edema, headaches, dizziness or visual disturbances.  No complications from the medications currently being used.  Last BP :  126/72.       INCLUSION BODY POLYMYOSITIS:  This is being followed by her neurologist, who has not recommended steroids or immunosuppressive therapy.  She denies any progressive weakness.    PAST MEDICAL HISTORY : Reviewed.  No changes/see problem list  CURRENT MEDICATIONS: Reviewed per MAR/see medication list  REVIEW OF SYSTEMS:  GENERAL: no change in appetite, no fatigue, no weight changes, no fever, chills or weakness RESPIRATORY: no cough, SOB, DOE, wheezing, hemoptysis CARDIAC: no chest pain or palpitations; increased right foot swelling       GI: no abdominal pain, diarrhea, constipation, heart burn, nausea or vomiting  PHYSICAL EXAMINATION  VS:  See VS section  GENERAL: no acute distress, normal body habitus EYES: conjunctivae normal, sclerae normal, normal eye lids NECK: supple, trachea midline, no neck masses, no thyroid tenderness, no thyromegaly LYMPHATICS: no LAN in the neck, no supraclavicular LAN RESPIRATORY: breathing is even & unlabored, BS CTAB CARDIAC: RRR, no murmur,no extra heart sounds, right foot has +2 edema, left foot has +1  edema    GI: abdomen soft, normal BS, no masses, no tenderness, no hepatomegaly, no splenomegaly PSYCHIATRIC: the patient is alert & oriented to person, affect & behavior appropriate  LABS/RADIOLOGY:  In 03/2014:  RBC folate 1369, folate 457.4, vitamin B12 level 628.    Hemoglobin 11, MCV 101, otherwise CBC normal.     BMP normal.      ASSESSMENT/PLAN:  HTN -well controlled Inclusion body myositis-continue supportive care GERD-continue PPI Allergic rhinitis-well-controlled Depression-denies ongoing symptoms  I have filled out patient's discharge paperwork and written prescriptions.  Patient will receive home health PT, OT, ST, and RN. DME provided:  Wheelchair with cushions (710.4, 728.87).        Total discharge time: Greater than 30 minutes Discharge time involved coordination of the discharge process with social worker, nursing staff and therapy department. Medical justification for home health services/DME verified.  CPT CODE: 57322              Makhiya Coburn Y Keo Schirmer, [place] [phone]

## 2014-05-04 ENCOUNTER — Ambulatory Visit (INDEPENDENT_AMBULATORY_CARE_PROVIDER_SITE_OTHER): Payer: Medicare PPO | Admitting: Internal Medicine

## 2014-05-04 ENCOUNTER — Encounter: Payer: Self-pay | Admitting: Internal Medicine

## 2014-05-04 VITALS — BP 128/67 | HR 70 | Temp 97.8°F | Ht 70.0 in | Wt 180.9 lb

## 2014-05-04 DIAGNOSIS — R05 Cough: Secondary | ICD-10-CM

## 2014-05-04 DIAGNOSIS — I10 Essential (primary) hypertension: Secondary | ICD-10-CM

## 2014-05-04 DIAGNOSIS — Z0000 Encounter for general adult medical examination without abnormal findings: Secondary | ICD-10-CM

## 2014-05-04 DIAGNOSIS — J301 Allergic rhinitis due to pollen: Secondary | ICD-10-CM

## 2014-05-04 DIAGNOSIS — R059 Cough, unspecified: Secondary | ICD-10-CM

## 2014-05-04 DIAGNOSIS — R058 Other specified cough: Secondary | ICD-10-CM

## 2014-05-04 DIAGNOSIS — Z23 Encounter for immunization: Secondary | ICD-10-CM

## 2014-05-04 MED ORDER — CETIRIZINE HCL 10 MG PO CAPS: 10.0000 mg | ORAL_CAPSULE | Freq: Every day | ORAL | Status: DC

## 2014-05-04 MED ORDER — MULTIVITAMINS PO CAPS: 1.0000 | ORAL_CAPSULE | Freq: Every day | ORAL | Status: DC

## 2014-05-04 NOTE — Patient Instructions (Signed)
Discontinue your spironolactone (blood pressure medication) to help with some of the presyncope symptoms. We have also referred OB/GYN for the abnormal pap smear. Please call us back on the number that we provided to set up an appointment to fill out the disability paper work.   General Instructions:   Please bring your medicines with you each time you come to clinic.  Medicines may include prescription medications, over-the-counter medications, herbal remedies, eye drops, vitamins, or other pills.   Progress Toward Treatment Goals:  Treatment Goal 05/04/2014  Blood pressure at goal  Prevent falls -    Self Care Goals & Plans:  Self Care Goal 05/04/2014  Manage my medications take my medicines as prescribed; bring my medications to every visit; refill my medications on time  Monitor my health -  Eat healthy foods drink diet soda or water instead of juice or soda; eat more vegetables; eat foods that are low in salt; eat baked foods instead of fried foods  Be physically active -    No flowsheet data found.   Care Management & Community Referrals:  Referral 03/10/2013  Referrals made to community resources falls prevention

## 2014-05-04 NOTE — Assessment & Plan Note (Addendum)
BP Readings from Last 3 Encounters:  04/23/14 126/72  04/20/14 126/70  04/01/14 110/70    Lab Results  Component Value Date   NA 136* 03/25/2014   K 4.0 03/25/2014   CREATININE 0.35* 03/25/2014    Assessment: Blood pressure control: controlled Progress toward BP goal:  at goal Comments: Patient currently on amlodipine 5 mg daily, clonidine 0.1 mg twice a day, metoprolol 100 mg twice a day, spironolactone 25 mg daily. Last BMP in August 2015 stable from priors.  Plan: Medications: Discontinue spironolactone given history of hyponatremia and recent presyncopal events secondary to orthostasis. Will continue to monitor blood pressure control.

## 2014-05-04 NOTE — Assessment & Plan Note (Signed)
Patient was referred to ob/gyn in 2012, though patient denies ever having followed up for this. Patient is 45 but has a history of an abnormal Pap smear.  - Flu shot today.

## 2014-05-04 NOTE — Progress Notes (Signed)
   Subjective:    Patient ID: Heather [name], female    DOB: [birth year], 73 y.o.   MRN: [id number]  HPI  Heather [name] is a 73 year old woman with a history of inclusion body myositis with progressive dysphasia, hypertension, who comes to clinic for routine follow up.   Please refer to separate problem-list charting for more details.  Review of Systems  Constitutional: Negative for fever and chills.  HENT: Positive for rhinorrhea. Negative for sneezing.   Respiratory: Negative for shortness of breath.   Cardiovascular: Negative for chest pain.  Gastrointestinal: Positive for constipation. Negative for nausea, vomiting, abdominal pain and diarrhea.  Genitourinary: Negative for dysuria and hematuria.  Neurological: Positive for weakness. Negative for syncope.       Objective:   Physical Exam  Constitutional: She is oriented to person, place, and time. She appears well-developed and well-nourished. No distress.  HENT:  Head: Normocephalic and atraumatic.  Eyes: EOM are normal. Pupils are equal, round, and reactive to light.  Neck: Normal range of motion. Neck supple. No thyromegaly present.  Cardiovascular: Normal rate and regular rhythm.  Exam reveals no gallop and no friction rub.   No murmur heard. Pulmonary/Chest: Effort normal and breath sounds normal. No respiratory distress. She has no wheezes. She has no rales.  Abdominal: Soft. Bowel sounds are normal. There is no tenderness. There is no rebound.  Musculoskeletal: She exhibits no edema.  Neurological: She is alert and oriented to person, place, and time.  4/5 strength throughout bilateral upper and lower extremities, sensation to light touch intact throughout upper and lower extremities, deep tendon reflexes absent throughout, finger to nose intact, gait unstable with short steps       Assessment & Plan:  Please refer to separate problem-list charting for more details.

## 2014-05-04 NOTE — Assessment & Plan Note (Signed)
Patient states that she has been taking Claritin since it is a smaller pill. However, this is no longer covered by her insurance.  - Continue patient on Nasacort

## 2014-05-05 ENCOUNTER — Telehealth: Payer: Self-pay | Admitting: Neurology

## 2014-05-05 NOTE — Telephone Encounter (Signed)
Patient's son, [name] checking status of referral to [facility] regarding feeding tube.  Please call anytime and may leave detailed message on voicemail if not available.

## 2014-05-05 NOTE — Telephone Encounter (Signed)
Called [facility] to see What was going on with patient referral Heather [name] that's schedules for [facility] Is in hospital  And has been last two week.  Will resend all information to [employer] . She will get scheduled called soon and left him a message.

## 2014-05-06 ENCOUNTER — Encounter: Payer: Self-pay | Admitting: Obstetrics and Gynecology

## 2014-05-06 NOTE — Progress Notes (Signed)
I saw and evaluated the patient.  I personally confirmed the key portions of Dr. Trudee Kuster history and exam and reviewed pertinent patient test results.  The assessment, diagnosis, and plan were formulated together and I agree with the documentation in the resident's note.  Will schedule patient with Dr. Raelene Bott at a time when they have disability paperwork to be filled out.  This is to be done during an appointment, not in between face to face appointments given concerns that paperwork was not previously filled out as requested.

## 2014-05-11 NOTE — Telephone Encounter (Signed)
Patient's son [name] calling again to check on the status of this message, please return call and advise.

## 2014-05-11 NOTE — Telephone Encounter (Signed)
Called and spoke to Heather [name] she is back to work and she will call son to schedule his mother.

## 2014-05-20 ENCOUNTER — Other Ambulatory Visit: Payer: Self-pay | Admitting: Internal Medicine

## 2014-05-20 DIAGNOSIS — I10 Essential (primary) hypertension: Secondary | ICD-10-CM

## 2014-05-20 DIAGNOSIS — F325 Major depressive disorder, single episode, in full remission: Secondary | ICD-10-CM

## 2014-05-25 ENCOUNTER — Telehealth: Payer: Self-pay | Admitting: Neurology

## 2014-05-25 NOTE — Telephone Encounter (Signed)
Patient's son [name] @ [phone], hasn't heard from [facility] regarding appointment scheduling for patient.  Please call and advise.

## 2014-05-26 NOTE — Telephone Encounter (Signed)
Called patient son back and patient has been scheduled for Dec. I called [facility] back to put patient on waiting list and to please try and get her a sooner appt.. Called soon back and told him that she is on waiting list . Patient and her son was fine with this.

## 2014-06-16 ENCOUNTER — Other Ambulatory Visit: Payer: Self-pay | Admitting: Internal Medicine

## 2014-06-18 ENCOUNTER — Encounter: Payer: Medicare PPO | Admitting: Obstetrics and Gynecology

## 2014-07-20 ENCOUNTER — Telehealth: Payer: Self-pay | Admitting: *Deleted

## 2014-07-20 ENCOUNTER — Other Ambulatory Visit: Payer: Self-pay | Admitting: *Deleted

## 2014-07-20 MED ORDER — METOPROLOL TARTRATE 100 MG PO TABS: 100.0000 mg | ORAL_TABLET | Freq: Two times a day (BID) | ORAL | Status: DC

## 2014-07-20 NOTE — Telephone Encounter (Signed)
Encounter opened in error. Yvonna Alanis, RN, 07/20/14, Jeanella Anton

## 2014-09-07 ENCOUNTER — Ambulatory Visit: Payer: Medicare Other | Admitting: Internal Medicine

## 2014-09-07 VITALS — BP 142/87 | HR 78 | Temp 98.1°F | Wt 178.6 lb

## 2014-09-07 DIAGNOSIS — G7241 Inclusion body myositis [IBM]: Secondary | ICD-10-CM

## 2014-09-07 NOTE — Progress Notes (Signed)
   Subjective:    Patient ID: Heather [name], female    DOB: [birth year], 74 y.o.   MRN: [id number]  HPI  Patient is a 74 year old woman with a history of inclusion body myositis, progressive dysphasia, hypertension, depression who presents to clinic for completion of a disability form.  Patient stating that her disability insurance relates to injuries and patient states that she had a fall in September 2012 that precipitated the decline in her functional status. Review of the chart only reveals that she suffered a left foot fracture with no other fractures in the knees or hips at the time. Patient's functional status continued to decline since that incident and patient was finally diagnosed with inclusion body myositis in 2015. Patient's disability form was not able to be fully completed since patient did not have all of the information on hand for its completion. Patient was told to return to complete forms to clinic either by mail or in person and that I would continue to fill out the information and send the disability form to her insurance company.  Specifically, the form will be filled to the effect that the main source of disability affecting her functional status is her inclusion body myositis and that the fall may have exacerbated her functional status.  Please refer to separate problem-list charting for more details.  Review of Systems None performed.     Objective:   Physical Exam    None performed.  Assessment & Plan:  Patient to return with more information on her disability forms for completion.

## 2014-09-22 ENCOUNTER — Ambulatory Visit (INDEPENDENT_AMBULATORY_CARE_PROVIDER_SITE_OTHER): Payer: Medicare Other | Admitting: Internal Medicine

## 2014-09-22 ENCOUNTER — Encounter: Payer: Self-pay | Admitting: Internal Medicine

## 2014-09-22 VITALS — BP 139/71 | HR 78 | Temp 97.8°F | Wt 181.3 lb

## 2014-09-22 DIAGNOSIS — R519 Headache, unspecified: Secondary | ICD-10-CM | POA: Insufficient documentation

## 2014-09-22 DIAGNOSIS — I10 Essential (primary) hypertension: Secondary | ICD-10-CM

## 2014-09-22 DIAGNOSIS — Z993 Dependence on wheelchair: Secondary | ICD-10-CM

## 2014-09-22 DIAGNOSIS — R51 Headache: Secondary | ICD-10-CM

## 2014-09-22 MED ORDER — AMITRIPTYLINE HCL 50 MG PO TABS: 150.0000 mg | ORAL_TABLET | Freq: Every day | ORAL | Status: DC

## 2014-09-22 NOTE — Patient Instructions (Addendum)
I have prescribed the amitriptyline for your migraine headaches and to help you sleep at night. Please take all your other medications as prescribed. I will submit your disability paperwork to the insurance company. Please bring your blood pressure cuff to clinic next time so that we check its accuracy.   General Instructions:   Thank you for bringing your medicines today. This helps [place] keep you safe from mistakes.   Progress Toward Treatment Goals:  Treatment Goal 05/04/2014  Blood pressure at goal  Prevent falls -    Self Care Goals & Plans:  Self Care Goal 05/04/2014  Manage my medications take my medicines as prescribed; bring my medications to every visit; refill my medications on time  Monitor my health -  Eat healthy foods drink diet soda or water instead of juice or soda; eat more vegetables; eat foods that are low in salt; eat baked foods instead of fried foods  Be physically active -    No flowsheet data found.   Care Management & Community Referrals:  Referral 03/10/2013  Referrals made to community resources falls prevention

## 2014-09-22 NOTE — Assessment & Plan Note (Signed)
Patient is reporting a 5 day history of constant headache. Patient states that it is consistent with her symptoms previously of migraines. Patient states that the pain is on the top of her head and is severe in quality. She states that it is associated with some mild nausea and photophobia but no vomiting. She reports some vision changes but states that this has been going on for several months and is also being followed for cataracts bilaterally. She states that she was previously on amitriptyline for migraine prophylaxis and states that medication worked well for her. She is also stating that she has some insomnia over the last several days as well. -Amitriptyline 50 mg daily at bedtime for migraines and insomnia

## 2014-09-22 NOTE — Progress Notes (Signed)
   Subjective:    Patient ID: Heather [name], female    DOB: 05-01-1941, 74 y.o.   MRN: [id number]  HPI  Patient is a 74 year old female with a history of inclusion body myositis, hypertension, depression, glaucoma who presents to clinic with a five-day history of headaches.  Please refer to separate problem-list charting for more details.  Review of Systems  Constitutional: Negative for fever and chills.  HENT: Negative for rhinorrhea and sore throat.   Eyes: Negative for visual disturbance.  Respiratory: Negative for cough and shortness of breath.   Cardiovascular: Negative for chest pain and palpitations.  Gastrointestinal: Negative for nausea, vomiting, abdominal pain, diarrhea, constipation and blood in stool.  Genitourinary: Negative for dysuria and hematuria.  Neurological: Positive for headaches. Negative for syncope.       Objective:   Physical Exam  Constitutional: She is oriented to person, place, and time. She appears well-developed and well-nourished. No distress.  HENT:  Head: Normocephalic and atraumatic.  Eyes: EOM are normal. Pupils are equal, round, and reactive to light. Left eye exhibits no discharge.  Neck: Normal range of motion. Neck supple. No thyromegaly present.  Cardiovascular: Normal rate and regular rhythm.  Exam reveals no gallop and no friction rub.   No murmur heard. Pulmonary/Chest: Effort normal and breath sounds normal. No respiratory distress. She has no wheezes. She has no rales.  Abdominal: Soft. Bowel sounds are normal. She exhibits no distension. There is no tenderness. There is no rebound.  Musculoskeletal: She exhibits no edema.  Neurological: She is alert and oriented to person, place, and time. No cranial nerve deficit.  Wheelchair-bound  Skin: Skin is warm and dry. No rash noted.  Psychiatric: She has a normal mood and affect. Thought content normal.          Assessment & Plan:  Please refer to separate problem-list  charting for more details.

## 2014-09-22 NOTE — Assessment & Plan Note (Signed)
BP Readings from Last 3 Encounters:  09/22/14 139/71  09/07/14 142/87  05/04/14 128/67    Lab Results  Component Value Date   NA 136* 03/25/2014   K 4.0 03/25/2014   CREATININE 0.35* 03/25/2014    Assessment: Blood pressure control: controlled Progress toward BP goal:  at goal Comments: Patient states that she is compliant with her clonidine 0.1 mg twice a day, metoprolol 100 mg twice a day, and amlodipine 5 mg daily. She was initially concerned that some high blood pressure readings at home may have been contributory to her headaches.  Plan: Medications:  continue current medications Educational resources provided:   Self management tools provided:   Other plans: Suggested that patient bring her blood pressure cuff to clinic so that we can verify its accuracy at her next visit.

## 2014-09-23 NOTE — Progress Notes (Signed)
INTERNAL MEDICINE TEACHING ATTENDING ADDENDUM - Tobie Perdue, MD: I reviewed and discussed at the time of visit with the resident Dr. Ngo, the patient's medical history, physical examination, diagnosis and results of pertinent tests and treatment and I agree with the patient's care as documented.  

## 2014-10-12 NOTE — Progress Notes (Signed)
Case discussed with Dr. Raelene Bott at the time of the visit.  We reviewed the resident's history and exam and pertinent patient test results.  I agree with the assessment, diagnosis, and plan of care documented in the resident's note.

## 2014-11-06 ENCOUNTER — Other Ambulatory Visit: Payer: Self-pay | Admitting: *Deleted

## 2014-11-06 DIAGNOSIS — I10 Essential (primary) hypertension: Secondary | ICD-10-CM

## 2014-11-06 DIAGNOSIS — F325 Major depressive disorder, single episode, in full remission: Secondary | ICD-10-CM

## 2014-11-06 NOTE — Telephone Encounter (Signed)
Pt has changed pharmacies to [facility].

## 2014-11-07 MED ORDER — HYDROCODONE-ACETAMINOPHEN 5-325 MG PO TABS: ORAL_TABLET | ORAL | Status: DC

## 2014-11-07 MED ORDER — ZOLPIDEM TARTRATE 5 MG PO TABS: ORAL_TABLET | ORAL | Status: DC

## 2014-11-07 MED ORDER — METOPROLOL TARTRATE 100 MG PO TABS: 100.0000 mg | ORAL_TABLET | Freq: Two times a day (BID) | ORAL | Status: AC

## 2014-11-07 MED ORDER — AMLODIPINE BESYLATE 5 MG PO TABS: 5.0000 mg | ORAL_TABLET | Freq: Every day | ORAL | Status: AC

## 2014-11-07 MED ORDER — FLUOXETINE HCL 40 MG PO CAPS: 40.0000 mg | ORAL_CAPSULE | Freq: Every day | ORAL | Status: DC

## 2014-11-07 MED ORDER — PANTOPRAZOLE SODIUM 40 MG PO TBEC: 40.0000 mg | DELAYED_RELEASE_TABLET | Freq: Every day | ORAL | Status: DC

## 2014-11-07 MED ORDER — CLONIDINE HCL 0.1 MG PO TABS
0.1000 mg | ORAL_TABLET | Freq: Two times a day (BID) | ORAL | Status: AC
Start: 2014-11-07 — End: ?

## 2014-11-07 NOTE — Addendum Note (Signed)
Addended by: [name] on: 11/07/2014 11:20 PM   Modules accepted: Orders, Medications

## 2014-11-11 ENCOUNTER — Other Ambulatory Visit: Payer: Self-pay | Admitting: Internal Medicine

## 2014-11-11 NOTE — Progress Notes (Signed)
Note that vicodin has been discontinued and script listed in the system from 11/07/14 was destroyed and was neither given to the patient nor dispensed.

## 2014-11-11 NOTE — Telephone Encounter (Addendum)
Ambien called in. I called pt to see if she is using vicodin.  She is to return call to clinic

## 2014-11-18 ENCOUNTER — Telehealth: Payer: Self-pay | Admitting: *Deleted

## 2014-11-18 NOTE — Telephone Encounter (Signed)
Heather [name]  [phone] with Home Health trying to fax info to clinic about home care. Hilda Blades Zan Triska RN 11/18/14 4:45PM

## 2014-11-25 ENCOUNTER — Ambulatory Visit (INDEPENDENT_AMBULATORY_CARE_PROVIDER_SITE_OTHER): Payer: Medicare Other | Admitting: Internal Medicine

## 2014-11-25 ENCOUNTER — Encounter: Payer: Self-pay | Admitting: Internal Medicine

## 2014-11-25 ENCOUNTER — Encounter: Payer: Self-pay | Admitting: Licensed Clinical Social Worker

## 2014-11-25 VITALS — BP 154/75 | HR 88 | Temp 97.8°F | Ht 70.0 in | Wt 185.7 lb

## 2014-11-25 DIAGNOSIS — R531 Weakness: Secondary | ICD-10-CM | POA: Diagnosis not present

## 2014-11-25 DIAGNOSIS — I10 Essential (primary) hypertension: Secondary | ICD-10-CM | POA: Diagnosis present

## 2014-11-25 DIAGNOSIS — F324 Major depressive disorder, single episode, in partial remission: Secondary | ICD-10-CM | POA: Diagnosis not present

## 2014-11-25 DIAGNOSIS — J301 Allergic rhinitis due to pollen: Secondary | ICD-10-CM

## 2014-11-25 DIAGNOSIS — F325 Major depressive disorder, single episode, in full remission: Secondary | ICD-10-CM

## 2014-11-25 MED ORDER — AMITRIPTYLINE HCL 50 MG PO TABS: 50.0000 mg | ORAL_TABLET | Freq: Every day | ORAL | Status: AC

## 2014-11-25 MED ORDER — LEVOCETIRIZINE DIHYDROCHLORIDE 5 MG PO TABS: 5.0000 mg | ORAL_TABLET | Freq: Every day | ORAL | Status: DC | PRN

## 2014-11-25 NOTE — Assessment & Plan Note (Signed)
She reports symptoms of seasonal allergies, especially when she goes out of the house. She takes cetirizine 10 mg daily. This medication can be sedating. I recommended switching her to Claritin 5 mg daily prn only as opposed to daily. She is agreeable.

## 2014-11-25 NOTE — Patient Instructions (Addendum)
General Instructions: Please stop taking ambien  Please stop taking Prozac ( take 20 mg daily for one week and then take 10 mg daily for another week and then stop)  I have changed you from Cetirizine to Claritin  Pleas come back in 2-3 weeks to see your regular doctor consider taking you off Elavil as well   Thank you for bringing your medicines today. This helps [place] keep you safe from mistakes.   Progress Toward Treatment Goals:  Treatment Goal 09/22/2014  Blood pressure at goal  Prevent falls -    Self Care Goals & Plans:  Self Care Goal 11/25/2014  Manage my medications take my medicines as prescribed; bring my medications to every visit; refill my medications on time; follow the sick day instructions if I am sick  Monitor my health keep track of my blood pressure; keep track of my weight  Eat healthy foods eat smaller portions; drink diet soda or water instead of juice or soda  Be physically active find an activity I enjoy    No flowsheet data found.   Care Management & Community Referrals:  Referral 03/10/2013  Referrals made to community resources falls prevention      Insomnia Insomnia is frequent trouble falling and/or staying asleep. Insomnia can be a long term problem or a short term problem. Both are common. Insomnia can be a short term problem when the wakefulness is related to a certain stress or worry. Long term insomnia is often related to ongoing stress during waking hours and/or poor sleeping habits. Overtime, sleep deprivation itself can make the problem worse. Every little thing feels more severe because you are overtired and your ability to cope is decreased. CAUSES   Stress, anxiety, and depression.  Poor sleeping habits.  Distractions such as TV in the bedroom.  Naps close to bedtime.  Engaging in emotionally charged conversations before bed.  Technical reading before sleep.  Alcohol and other sedatives. They may make the problem worse. They can  hurt normal sleep patterns and normal dream activity.  Stimulants such as caffeine for several hours prior to bedtime.  Pain syndromes and shortness of breath can cause insomnia.  Exercise late at night.  Changing time zones may cause sleeping problems (jet lag). It is sometimes helpful to have someone observe your sleeping patterns. They should look for periods of not breathing during the night (sleep apnea). They should also look to see how long those periods last. If you live alone or observers are uncertain, you can also be observed at a sleep clinic where your sleep patterns will be professionally monitored. Sleep apnea requires a checkup and treatment. Give your caregivers your medical history. Give your caregivers observations your family has made about your sleep.  SYMPTOMS   Not feeling rested in the morning.  Anxiety and restlessness at bedtime.  Difficulty falling and staying asleep. TREATMENT   Your caregiver may prescribe treatment for an underlying medical disorders. Your caregiver can give advice or help if you are using alcohol or other drugs for self-medication. Treatment of underlying problems will usually eliminate insomnia problems.  Medications can be prescribed for short time use. They are generally not recommended for lengthy use.  Over-the-counter sleep medicines are not recommended for lengthy use. They can be habit forming.  You can promote easier sleeping by making lifestyle changes such as:  Using relaxation techniques that help with breathing and reduce muscle tension.  Exercising earlier in the day.  Changing your diet and the  time of your last meal. No night time snacks.  Establish a regular time to go to bed.  Counseling can help with stressful problems and worry.  Soothing music and white noise may be helpful if there are background noises you cannot remove.  Stop tedious detailed work at least one hour before bedtime. HOME CARE INSTRUCTIONS    Keep a diary. Inform your caregiver about your progress. This includes any medication side effects. See your caregiver regularly. Take note of:  Times when you are asleep.  Times when you are awake during the night.  The quality of your sleep.  How you feel the next day. This information will help your caregiver care for you.  Get out of bed if you are still awake after 15 minutes. Read or do some quiet activity. Keep the lights down. Wait until you feel sleepy and go back to bed.  Keep regular sleeping and waking hours. Avoid naps.  Exercise regularly.  Avoid distractions at bedtime. Distractions include watching television or engaging in any intense or detailed activity like attempting to balance the household checkbook.  Develop a bedtime ritual. Keep a familiar routine of bathing, brushing your teeth, climbing into bed at the same time each night, listening to soothing music. Routines increase the success of falling to sleep faster.  Use relaxation techniques. This can be using breathing and muscle tension release routines. It can also include visualizing peaceful scenes. You can also help control troubling or intruding thoughts by keeping your mind occupied with boring or repetitive thoughts like the old concept of counting sheep. You can make it more creative like imagining planting one beautiful flower after another in your backyard garden.  During your day, work to eliminate stress. When this is not possible use some of the previous suggestions to help reduce the anxiety that accompanies stressful situations. MAKE SURE YOU:   Understand these instructions.  Will watch your condition.  Will get help right away if you are not doing well or get worse. Document Released: 07/21/2000 Document Revised: 10/16/2011 Document Reviewed: 08/21/2007 [facility] Patient Information 2015 [place], [place]. This information is not intended to replace advice given to you by your health care  provider. Make sure you discuss any questions you have with your health care provider.

## 2014-11-25 NOTE — Progress Notes (Signed)
Internal Medicine Clinic Attending  Case discussed with Dr. Kazibwe soon after the resident saw the patient.  We reviewed the resident's history and exam and pertinent patient test results.  I agree with the assessment, diagnosis, and plan of care documented in the resident's note. 

## 2014-11-25 NOTE — Assessment & Plan Note (Signed)
BP Readings from Last 3 Encounters:  11/25/14 154/75  09/22/14 139/71  09/07/14 142/87    Lab Results  Component Value Date   NA 136* 03/25/2014   K 4.0 03/25/2014   CREATININE 0.35* 03/25/2014    Assessment: Blood pressure control:   BP slightly elevated at 154/75 Progress toward BP goal:    Comments: On clonidine 0.1 mg twice a day, Lopressor 100 mg twice a day and Amlodipine 5 mg daily  Plan: Medications:  continue current medications Educational resources provided: brochure, handout, video Self management tools provided:   Other plans: Follow-up in 2-3 weeks and may make adjustments in her medications if needed.

## 2014-11-25 NOTE — Progress Notes (Signed)
Heather [name] presents today for scheduled appointment.  Pt requesting PCS; however, CSW unable to verify through [facility].  Physician states pt would benefit from [facility] at this time.  CSW has requesting [facility] to look into pt's eligibility as pt provided CSW an Annual [place] Medicaid card dated 09/2014.  CSW will proceed with PCS request.

## 2014-11-25 NOTE — Assessment & Plan Note (Addendum)
Her described symptoms of weakness seem to be global and multifactorial including progressive inclusive myositis, and polypharmacy. Patient takes Prozac 40 mg daily, Ambien 5 mg at bedtime (last taken 3 nights ago), Cetirizine10 mg daily, and Amitriptyline 50 mg at bedtime. We are unable to obtain her orthostatic vitals. However, my exam did not reveal any focal neurological deficits.  Plan -DC Ambien. Given patient sleep hygiene instructions. -Prozac taper over the next 2 weeks. -May consider discontinuing Clonidine of this medication, as well be sedating. -Switched her Cetirizine to Claritin to minimize sedating side effects -The next step will be to try to address Amitriptyline. She is currently taking 50 mg daily at bedtime for sleep and headaches. If we can get off this medication, her dizziness and weakness can improve. We can try to find an alternative for headaches like APAP.

## 2014-11-25 NOTE — Progress Notes (Signed)
Patient ID: Heather [name], female   DOB: August 09, 1940, 74 y.o.   MRN: [id number]   Subjective:   HPI: Heather [name] is a 74 y.o. woman with a past medical history as listed below presents for an acute clinic visit due to increased dizziness and fatigue and weakness in her legs.  She states that over the past 1 week, she has been having increased weakness in her extremities bilaterally, and she's been having problems standing up. She denies any headaches. She denies any urine or fecal incontinence. She denies back pain. She does not have focal weakness. She reports feeling her head "heavy" and sometimes swimmy headed. She denies fevers, chills, or any other constitutional symptoms. 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   Past Medical History  Diagnosis Date  . Depression     Controlled with SSRI  . HTN (hypertension)   . GERD (gastroesophageal reflux disease)     Chronic PPI's  . S/P dilatation of esophageal stricture     7/12 Dr Carlean Purl. Dilation of tight stricture proximal eso.  Prior dilation 09 and 08.  . Seasonal allergies     Zyrtec D PRN  . NASH (nonalcoholic steatohepatitis)     Per [place] 10/10  . History of PSVT (paroxysmal supraventricular tachycardia)     Remote and self limited.  . Gout     No crystal dx. R podegra x 3 episodes around 2010. Did not take the allopurinol that was rec 2011.  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 Chronic venous insufficiency     Present for years. Norvasc D/C'd 2012 thinking it might be contributing.  . Migraines   . Glaucoma   . Neuropathy   . Back pain   . Muscle weakness   . Polymyositis   . 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  Respiratory: Denies SOB, DOE,  cough, chest tightness, and wheezing. CVS: No chest pain, palpitations. Reports mild swelling of her lower extremities when she sits for long hours.  GI: Dysphagia, which has been attributed to inclusion body myositis. No abdominal pain, nausea, vomiting, bloody stools GU: No dysuria, frequency, hematuria, or flank pain.  MSK: She is wheelchair bound and that home she uses a walker to get around. She lives with a roommate. No myalgias, back pain, joint swelling, arthralgias  Psych: No depression symptoms. No SI or SA.    Objective:  Physical Exam: Filed Vitals:   11/25/14 1022  BP: 154/75  Pulse: 88  Temp: 97.8 F (36.6 C)  TempSrc: Oral  Height: 5\' 10"  (1.778 m)  Weight: 185 lb 11.2 oz (84.233 kg)  SpO2: 100%   General: Well nourished. No acute distress.  HEENT: Normal oral mucosa. MMM.  Lungs: CTA bilaterally. Heart: RRR; no extra sounds or murmurs  Abdomen: Non-distended, normal bowel sounds, soft, nontender; no hepatosplenomegaly  Extremities: No pedal edema. No joint swelling or tenderness.  Neurologic Exam:   Mental Status: Alert, oriented, thought content appropriate.  Speech fluent without evidence of aphasia. Able to follow 3 step commands without difficulty.  Cranial Nerves:   II: Visual fields grossly intact.  III/IV/VI: Extraocular movements intact.  Pupils reactive bilaterally.  V/VII: Smile symmetric. Facial light touch sensation normal bilaterally.  VIII: Grossly intact.  XI: Bilateral shoulder shrug normal.  XII: Midline tongue extension normal.  Motor:  3-4/ bilaterally with normal tone and bulk  Sensory:  Light touch intact throughout, bilaterally  DTRs: 2+ and symmetric throughout  Plantars:  Downgoing bilaterally  Cerebellar: Normal finger-to-nose, normal rapid alternating movements and normal heel-to-shin test. Gait testing deferred due to patient being in wheelchair.     Assessment & Plan:  Discussed case with my attending in the clinic, Dr.  Lynnae January See problem based charting.

## 2014-11-25 NOTE — Assessment & Plan Note (Addendum)
Patient reports that her symptoms of depression have been well controlled over the past several years. She denies any suicidal attempts or suicidal ideations. She also states that when this medication was initiated she had some "tingling in my legs" which improved after she was started on Prozac. I am not sure what this means. She might have some neuropathic pain maybe. Plan -Discussed with the patient about risks and benefits of continuing with this medication on a long-term basis given that she is already taking multiple other medications that can cause dizziness. Recommended trying to take her off Prozac if we can in order to address polypharmacy. If she develops depression symptoms in the long run, we can consider reintroducing antidepressants. -Since she has been on this medication for a long time, will plan to gradually taper her off with 20 mg daily for 1 week and then 10 mg daily one week and stop -Monitor for depression symptoms on subsequent visits and reevaluate for treatment if needed

## 2014-12-15 ENCOUNTER — Encounter: Payer: Self-pay | Admitting: *Deleted

## 2015-01-25 ENCOUNTER — Encounter: Payer: Medicare Other | Admitting: Internal Medicine

## 2015-02-01 ENCOUNTER — Other Ambulatory Visit: Payer: Self-pay

## 2015-06-29 ENCOUNTER — Encounter: Payer: Self-pay | Admitting: Student

## 2015-07-15 ENCOUNTER — Other Ambulatory Visit: Payer: Self-pay | Admitting: Internal Medicine

## 2015-07-19 NOTE — Telephone Encounter (Signed)
Heather [name] called to pharm

## 2015-08-11 ENCOUNTER — Other Ambulatory Visit: Payer: Self-pay

## 2015-08-11 ENCOUNTER — Other Ambulatory Visit: Payer: Self-pay | Admitting: Internal Medicine

## 2015-09-23 ENCOUNTER — Other Ambulatory Visit: Payer: Self-pay | Admitting: Nurse Practitioner

## 2015-09-23 DIAGNOSIS — R52 Pain, unspecified: Secondary | ICD-10-CM

## 2015-09-23 DIAGNOSIS — R609 Edema, unspecified: Secondary | ICD-10-CM

## 2015-09-28 ENCOUNTER — Ambulatory Visit
Admission: RE | Admit: 2015-09-28 | Discharge: 2015-09-28 | Disposition: A | Discharge status: Home / Self Care | Payer: Medicare Other | Source: Ambulatory Visit | Attending: Nurse Practitioner | Admitting: Nurse Practitioner

## 2015-09-28 DIAGNOSIS — R609 Edema, unspecified: Secondary | ICD-10-CM

## 2015-09-28 DIAGNOSIS — R52 Pain, unspecified: Secondary | ICD-10-CM

## 2015-11-25 ENCOUNTER — Encounter: Payer: Self-pay | Admitting: Internal Medicine

## 2015-12-10 ENCOUNTER — Other Ambulatory Visit: Payer: Self-pay | Admitting: Internal Medicine

## 2015-12-10 ENCOUNTER — Ambulatory Visit: Payer: Medicare Other | Admitting: Internal Medicine

## 2015-12-10 ENCOUNTER — Encounter: Payer: Self-pay | Admitting: Internal Medicine

## 2016-03-28 ENCOUNTER — Other Ambulatory Visit: Payer: Self-pay | Admitting: Internal Medicine

## 2016-09-18 ENCOUNTER — Encounter: Payer: Self-pay | Admitting: Internal Medicine

## 2017-01-01 ENCOUNTER — Emergency Department (HOSPITAL_COMMUNITY): Payer: Medicare Other

## 2017-01-01 ENCOUNTER — Emergency Department (HOSPITAL_COMMUNITY)
Admission: EM | Admit: 2017-01-01 | Discharge: 2017-01-01 | Disposition: A | Discharge status: Home / Self Care | Payer: Medicare Other | Attending: Emergency Medicine | Admitting: Emergency Medicine

## 2017-01-01 ENCOUNTER — Encounter (HOSPITAL_COMMUNITY): Payer: Self-pay | Admitting: Emergency Medicine

## 2017-01-01 DIAGNOSIS — G7241 Inclusion body myositis [IBM]: Secondary | ICD-10-CM | POA: Insufficient documentation

## 2017-01-01 DIAGNOSIS — R51 Headache: Secondary | ICD-10-CM | POA: Insufficient documentation

## 2017-01-01 DIAGNOSIS — S8991XA Unspecified injury of right lower leg, initial encounter: Secondary | ICD-10-CM | POA: Diagnosis present

## 2017-01-01 DIAGNOSIS — Z87891 Personal history of nicotine dependence: Secondary | ICD-10-CM | POA: Diagnosis not present

## 2017-01-01 DIAGNOSIS — W1830XA Fall on same level, unspecified, initial encounter: Secondary | ICD-10-CM | POA: Insufficient documentation

## 2017-01-01 DIAGNOSIS — Y939 Activity, unspecified: Secondary | ICD-10-CM | POA: Diagnosis not present

## 2017-01-01 DIAGNOSIS — Z91040 Latex allergy status: Secondary | ICD-10-CM | POA: Diagnosis not present

## 2017-01-01 DIAGNOSIS — S82001A Unspecified fracture of right patella, initial encounter for closed fracture: Secondary | ICD-10-CM | POA: Insufficient documentation

## 2017-01-01 DIAGNOSIS — Y999 Unspecified external cause status: Secondary | ICD-10-CM | POA: Diagnosis not present

## 2017-01-01 DIAGNOSIS — Z79899 Other long term (current) drug therapy: Secondary | ICD-10-CM | POA: Diagnosis not present

## 2017-01-01 DIAGNOSIS — W19XXXA Unspecified fall, initial encounter: Secondary | ICD-10-CM

## 2017-01-01 DIAGNOSIS — I10 Essential (primary) hypertension: Secondary | ICD-10-CM | POA: Insufficient documentation

## 2017-01-01 DIAGNOSIS — Y92009 Unspecified place in unspecified non-institutional (private) residence as the place of occurrence of the external cause: Secondary | ICD-10-CM | POA: Diagnosis not present

## 2017-01-01 LAB — POCT I-STAT, CHEM 8
BUN: 14 mg/dL (ref 6–20)
CHLORIDE: 102 mmol/L (ref 101–111)
Calcium, Ion: 1.16 mmol/L (ref 1.15–1.40)
Creatinine, Ser: 0.5 mg/dL (ref 0.44–1.00)
Glucose, Bld: 92 mg/dL (ref 65–99)
HEMATOCRIT: 38 % (ref 36.0–46.0)
Hemoglobin: 12.9 g/dL (ref 12.0–15.0)
Potassium: 3.8 mmol/L (ref 3.5–5.1)
Sodium: 144 mmol/L (ref 135–145)
TCO2: 35 mmol/L (ref 0–100)

## 2017-01-01 MED ORDER — HYDROCODONE-ACETAMINOPHEN 7.5-325 MG/15ML PO SOLN: ORAL | 0 refills | Status: DC

## 2017-01-01 MED ORDER — HYDROCODONE-ACETAMINOPHEN 5-325 MG PO TABS
1.0000 | ORAL_TABLET | Freq: Once | ORAL | Status: DC
  Filled 2017-01-01: qty 1

## 2017-01-01 MED ORDER — DOCUSATE SODIUM 100 MG PO CAPS: 100.0000 mg | ORAL_CAPSULE | Freq: Two times a day (BID) | ORAL | 0 refills | Status: AC

## 2017-01-01 MED ORDER — HYDROCODONE-ACETAMINOPHEN 7.5-325 MG/15ML PO SOLN
10.0000 mL | Freq: Once | ORAL | Status: AC
  Administered 2017-01-01: 10 mL via ORAL
  Filled 2017-01-01: qty 15

## 2017-01-01 MED ORDER — POLYETHYLENE GLYCOL 3350 17 G PO PACK: 17.0000 g | PACK | Freq: Every day | ORAL | 0 refills | Status: DC

## 2017-01-01 NOTE — Progress Notes (Signed)
Orthopedic Tech Progress Note Patient Details:  Heather [name] 05-Sep-1940 [id number]  Verbal order from Dr. Johnney Killian to apply ace bandage under Knee Immobilizer.  Ortho Devices Type of Ortho Device: Ace wrap, Knee Immobilizer Ortho Device/Splint Location: Rt Knee Ortho Device/Splint Interventions: Application, Adjustment   Heather [name] 01/01/2017, 7:27 PM

## 2017-01-01 NOTE — ED Triage Notes (Addendum)
Pt verbalizes posterior headache and right knee pain post fall 30 minutes to an hour ago related to "right leg giving out." Pt denies neck pain. Pt denies LOC or blood thinners. Pt pale in color.

## 2017-01-01 NOTE — ED Provider Notes (Signed)
[facility] DEPT Provider Note   CSN: [id number] Arrival date & time: 01/01/17  1428     History   Chief Complaint Chief Complaint  Patient presents with  . Fall    Heather [name] is a 76 y.o. female.  Heather Patient fell prior to arrival. She has had increasing general lower extremity weakness due to inclusion body myositis. She reports that her right leg gave out on her and she fell with the right leg going back very far. She reports she did hit her head but did not have loss of consciousness. She does not take blood thinners. No confusion or focal weakness different than baseline. No associated cervical pain. Patient does reports she's developing some pain in her lower back down at the base of her spine where she landed when she fell. Past Medical History:  Diagnosis Date  . Back pain   . Chronic venous insufficiency    Present for years. Norvasc D/C'd 2012 thinking it might be contributing.  . Depression    Controlled with SSRI  . 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 GERD (gastroesophageal reflux disease)    Chronic PPI's  . Glaucoma   . Gout    No crystal dx. R podegra x 3 episodes around 2010. Did not take the allopurinol that was rec 2011.  Marland Kitchen History of PSVT (paroxysmal supraventricular tachycardia)    Remote and self limited.  Marland Kitchen HTN (hypertension)   . LGSIL (low grade squamous intraepithelial lesion) on Pap smear 02/06/07   HPV Gyn 8/08 negative  . Migraines   . Muscle weakness   . NASH (nonalcoholic steatohepatitis)    Per [place] 10/10  . Neuropathy   . Polymyositis ([place])   . S/P dilatation of esophageal stricture    7/12 Dr Carlean Purl. Dilation of tight stricture proximal eso.  Prior dilation 09 and 08.  . Seasonal allergies    Zyrtec D PRN    Patient Active Problem List   Diagnosis Date Noted  . Headache 09/22/2014  . Anemia of other chronic disease 04/09/2014  . Weakness 03/23/2014  . Pre-syncope  03/23/2014  . Orthostasis 03/23/2014  . Upper airway cough syndrome 10/20/2013  . 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 UTI (lower urinary tract infection) 04/28/2013  . Dermatitis 03/10/2013  . 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 Chronic sore throat 08/14/2012  . Chronic cough 08/14/2012  . 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 Swollen lymph nodes 05/28/2012  . Other dysphagia 02/13/2011  . Routine health maintenance 11/08/2010  . History of PSVT (paroxysmal supraventricular tachycardia) 09/08/2010  . NASH (nonalcoholic steatohepatitis)   . Gout   . LGSIL (low grade squamous intraepithelial lesion) on Pap smear   . ESOPHAGEAL STENOSIS 06/25/2008  . Major depression in remission ([place]) 11/16/2006  . GLAUCOMA NOS 11/16/2006  . Essential hypertension 11/16/2006  . ALLERGIC RHINITIS, SEASONAL 11/16/2006  . GERD 11/16/2006    Past Surgical History:  Procedure Laterality Date  . ABDOMINAL HYSTERECTOMY  1980   2/2 menorrhagia  . BLADDER REPAIR    . CHOLECYSTECTOMY    . COLONOSCOPY  09/28/2006   diverticulosis  . ESOPHAGOGASTRODUODENOSCOPY  09/28/2006; 07/17/2008   dilation of proximal stenosis/cricopharyngeal achalasais both times, occult ring, ? dyspmotility of esophagus  . ESOPHAGOGASTRODUODENOSCOPY N/A 05/06/2013   Procedure: ESOPHAGOGASTRODUODENOSCOPY (EGD) Probable Venia Minks.;  Surgeon: Jerene Bears, MD;  Location: [facility];  Service: Gastroenterology;  Laterality: N/A;  . EYE SURGERY  cataracts, glaucoma  . KNEE ARTHROSCOPY    . LAPAROSCOPIC BURCH PROCEDURE  04/02/07   Burch colposuspension. Inadvertant bladder damage - bladder repair, left ureteral stent placement, .  . MUSCLE BIOPSY Right 11/18/2012   Procedure: right vastus lateralis muscle biopsy ;  Surgeon: Haywood Lasso, MD;  Location: [facility];  Service: General;  Laterality: Right;  . TUBAL LIGATION      OB History    No data available         Home Medications    Prior to Admission medications   Medication Sig Start Date End Date Taking? Authorizing Provider  amitriptyline (ELAVIL) 50 MG tablet Take 1 tablet (50 mg total) by mouth at bedtime. 11/25/14   Jessee Avers, MD  amLODipine (NORVASC) 5 MG tablet Take 1 tablet (5 mg total) by mouth daily. 11/07/14   [name], MD  brimonidine [facility]) 0.2 % ophthalmic solution  10/04/15   [provider]  cloNIDine (CATAPRES) 0.1 MG tablet Take 1 tablet (0.1 mg total) by mouth 2 (two) times daily. 11/07/14   [name], MD  docusate sodium (COLACE) 100 MG capsule Take 1 capsule (100 mg total) by mouth every 12 (twelve) hours. 01/01/17   Charlesetta Shanks, MD  esomeprazole (NEXIUM) 40 MG capsule Take 1 capsule (40 mg total) by mouth daily before breakfast. 07/14/13   Dorian Heckle, MD  FLUoxetine (PROZAC) 40 MG capsule Take 40 mg by mouth.    [provider]  HYDROcodone-acetaminophen (HYCET) 7.5-325 mg/15 ml solution 15 ml every 4-6 hours as needed for pain 01/01/17   Charlesetta Shanks, MD  latanoprost (XALATAN) 0.005 % ophthalmic solution Place 1 drop into the left eye at bedtime.  01/19/14   [provider]  levocetirizine (XYZAL) 5 MG tablet TAKE ONE TABLET BY MOUTH ONCE DAILY AS NEEDED FOR  ALLERGIES 08/12/15   Rice, Resa Miner, MD  metoprolol (LOPRESSOR) 100 MG tablet Take 1 tablet (100 mg total) by mouth 2 (two) times daily. 11/07/14   [name], MD  Multiple Vitamin (MULTI-VITAMINS) TABS TAKE 1 TABLET EVERY DAY 06/16/14   [name], MD  Multiple Vitamin (MULTIVITAMIN) capsule Take 1 capsule by mouth daily. 05/04/14   [name], MD  ondansetron (ZOFRAN ODT) 4 MG disintegrating tablet Take 1 tab every 6-8 hours as needed for nausea. 12/19/13   Esterwood, Amy S, PA-C  pantoprazole (PROTONIX) 40 MG tablet Take 1 tablet (40 mg total) by mouth daily. 11/07/14   [name], MD  polyethylene glycol (MIRALAX / Floria Raveling) packet Take 17 g by mouth daily.  01/01/17   Charlesetta Shanks, MD  triamcinolone (NASACORT AQ) 55 MCG/ACT AERO nasal inhaler Place 1 spray into the nose 2 (two) times daily. 10/20/13 10/20/14  Dorian Heckle, MD  zolpidem (AMBIEN) 5 MG tablet TAKE ONE TABLET BY MOUTH AT BEDTIME AS NEEDED. 07/19/15   Rice, Resa Miner, MD    Family History Family History  Problem Relation Age of Onset  . Heart disease Mother   . Hypertension Mother   . Heart attack Mother   . Heart disease Father   . Hypertension Father   . Stroke Father   . Heart disease Sister   . Hypertension Sister   . Throat cancer Brother   . Hypertension Brother   . Heart disease Brother   . Colon cancer Neg Hx     Social History Social History  Substance Use Topics  . Smoking status: Former Smoker    Types: Cigarettes    Quit date:  08/07/1968  . Smokeless tobacco: Never Used  . Alcohol use No     Allergies   Propoxyphene napsylate; Darvon; and Latex   Review of Systems Review of Systems 10 Systems reviewed and are negative for acute change except as noted in the Heather.   Physical Exam Updated Vital Signs BP (!) 164/95   Pulse 85   Temp 98.1 F (36.7 C) (Oral)   Resp 16   Ht 5\' 10"  (1.778 m)   Wt 83.9 kg (185 lb)   LMP 11/08/1978   SpO2 98%   BMI 26.54 kg/m   Physical Exam  Constitutional: She is oriented to person, place, and time. She appears well-developed and well-nourished. No distress.  HENT:  Head: Normocephalic and atraumatic.  Right Ear: External ear normal.  Left Ear: External ear normal.  Nose: Nose normal.  Mouth/Throat: Oropharynx is clear and moist.  Eyes: Conjunctivae and EOM are normal. Pupils are equal, round, and reactive to light.  Neck: Neck supple.  Cardiovascular: Normal rate and regular rhythm.   No murmur heard. Pulmonary/Chest: Effort normal and breath sounds normal. No respiratory distress.  Abdominal: Soft. She exhibits no distension. There is no tenderness.  Musculoskeletal: She exhibits tenderness.  She exhibits no edema.  Patient is tender to palpation over the right patella. At this time there is not obvious effusion or abrasion or contusion. She is however tender to compression. Lower leg does not have peripheral edema. Skin condition is very good. No extremity deformities. Spine is nontender to palpation to the coccyx. Where there is mild tenderness.  Neurological: She is alert and oriented to person, place, and time. No cranial nerve deficit.  Patient has chronic weakness due to her underlying myositis. No change from baseline.  Skin: Skin is warm and dry.  Psychiatric: She has a normal mood and affect.  Nursing note and vitals reviewed.    ED Treatments / Results  Labs (all labs ordered are listed, but only abnormal results are displayed) Labs Reviewed  I-STAT CHEM 8, ED  POCT I-STAT, CHEM 8    EKG  EKG Interpretation None       Radiology Ct Head Wo Contrast  Result Date: 01/01/2017 CLINICAL DATA:  Patient fell hitting back of head against wall. Generalized leg weakness. EXAM: CT HEAD WITHOUT CONTRAST TECHNIQUE: Contiguous axial images were obtained from the base of the skull through the vertex without intravenous contrast. COMPARISON:  03/22/2014 FINDINGS: Brain: No intracranial hemorrhage, midline shift or edema. Minimal small vessel ischemic changes of periventricular white matter. No extra-axial fluid collections, intra-axial mass nor large vascular territory infarction. No hydrocephalus. No effacement of the basal cisterns. Fourth ventricle is midline. Vascular: No hyperdense vessels or unexpected calcifications. Skull: Negative for fracture or focal lesion. Sinuses/Orbits: No acute finding. Right lens replacement. Intact globes. Other: None IMPRESSION: Minimal chronic appearing small vessel ischemic disease. No acute intracranial abnormality. Electronically Signed   By: Ashley Royalty M.D.   On: 01/01/2017 16:08   Dg Knee Complete 4 Views Right  Result Date:  01/01/2017 CLINICAL DATA:  Patient fell today at home when knee gave out. Right knee pain and inability distended. EXAM: RIGHT KNEE - COMPLETE 4+ VIEW COMPARISON:  None. FINDINGS: There is an acute, closed, nondisplaced lower pole fracture of the patella without distraction of the fracture fragments. Adjacent soft tissue induration is noted along the course the quadriceps and patellar tendons with small joint effusion. Degenerative joint space narrowing of the femorotibial compartment with tibial spine spurring. IMPRESSION: Acute,  closed, nondisplaced lower pole fracture of the patella without distraction of the fracture fragments. Soft tissue induration along the patellar and quadriceps tendon with small joint effusion. Electronically Signed   By: Ashley Royalty M.D.   On: 01/01/2017 16:16    Procedures Procedures (including critical care time)  Medications Ordered in ED Medications  HYDROcodone-acetaminophen (HYCET) 7.5-325 mg/15 ml solution 10 mL (10 mLs Oral Given 01/01/17 1927)     Initial Impression / Assessment and Plan / ED Course  I have reviewed the triage vital signs and the nursing notes.  Pertinent labs & imaging results that were available during my care of the patient were reviewed by me and considered in my medical decision making (see chart for details).      Final Clinical Impressions(s) / ED Diagnoses   Final diagnoses:  Fall, initial encounter  Closed nondisplaced fracture of right patella, unspecified fracture morphology, initial encounter  Inclusion body myositis  Patient has been having problems with falls due to chronic degenerative inclusion body myositis. The weakness in association with this is not acute. When the patient fell she injured her right knee and hit her head. She did report posterior headache. CT does not show any intracranial injury. Physical examination shows alert mental status is normal cognitive function and no focal neurologic deficits. Patient and  family members had concern for her going deterioration and failure of ADLs due to lower extremity weakness. At this time, aside from the patellar fracture which is nondisplaced or does not appear to be other acute changes. Family members and the patient were interested in placement for possible rehabilitation or nursing care. Social work was consult to discuss this process. Case management also consult to assist the patient in additional in-home care.   New Prescriptions New Prescriptions   DOCUSATE SODIUM (COLACE) 100 MG CAPSULE    Take 1 capsule (100 mg total) by mouth every 12 (twelve) hours.   HYDROCODONE-ACETAMINOPHEN (HYCET) 7.5-325 MG/15 ML SOLUTION    15 ml every 4-6 hours as needed for pain   POLYETHYLENE GLYCOL (MIRALAX / GLYCOLAX) PACKET    Take 17 g by mouth daily.     Charlesetta Shanks, MD 01/01/17 2023

## 2017-01-01 NOTE — Progress Notes (Addendum)
CSW met with pt and explained the CSW's role and how due to the pt having Medicare A and B and not meeting criteria for inpatient at this time, the pt will not be admitted and have the qualifying 3-day inpatient stay needed for Medicare to pay for short-term rehab.  CSW informed family private pay stays are available, but family prefers home health options at this time.  CSW updated EDP who placed a CM consult.    CSW asked that family please request social worker if family has any additional questions or social work needs.  Pt and family were appreciative and thanked the CSW.  Please reconsult if future social work needs arise.    Alphonse Guild. Kendyll Huettner, Latanya Presser, LCAS Clinical Social Worker Ph: [phone]

## 2017-01-01 NOTE — ED Notes (Signed)
Reviewed pt status and complaint with Isaacs MD; verbal orders placed.

## 2017-01-01 NOTE — Progress Notes (Addendum)
CSW met with pt and family and pt's son Heather [name] at ph: [phone] asked to be present or involved with all medical/discharge decisions and questions.  Alphonse Guild. Tykisha Areola, Latanya Presser, LCAS Clinical Social Worker Ph: [phone]

## 2017-01-01 NOTE — Progress Notes (Addendum)
CSW spoke to [facility] and pt will be followed up with for CM needs on 5/28 and will receive a call at home.  CSW updated pt's family.  CSW will forward pt's son's name and phone number to [facility] via email and CM has stated pt's son wil be contacted tomorrow.  Please reconsult if future social work needs arise.     Alphonse Guild. Shatha Hooser, Latanya Presser, LCAS Clinical Social Worker Ph: [phone]

## 2017-01-01 NOTE — Discharge Instructions (Signed)
Wear your knee immobilizer as much as possible. You may remove it when you are lying in your bed and the leg is supported and straight.  If you have any tendency towards constipation, take Colace and MiraLAX to avoid constipation while taking your narcotic pain medication (hydrocodone acetaminophen).  Make an appointment to see the orthopedic doctor as soon as possible.

## 2017-01-29 ENCOUNTER — Ambulatory Visit (INDEPENDENT_AMBULATORY_CARE_PROVIDER_SITE_OTHER): Payer: Medicare Other

## 2017-01-29 ENCOUNTER — Ambulatory Visit (INDEPENDENT_AMBULATORY_CARE_PROVIDER_SITE_OTHER): Payer: Medicare Other | Admitting: Orthopedic Surgery

## 2017-01-29 ENCOUNTER — Ambulatory Visit (INDEPENDENT_AMBULATORY_CARE_PROVIDER_SITE_OTHER): Payer: Self-pay

## 2017-01-29 ENCOUNTER — Encounter (INDEPENDENT_AMBULATORY_CARE_PROVIDER_SITE_OTHER): Payer: Self-pay | Admitting: Orthopedic Surgery

## 2017-01-29 VITALS — Ht 70.0 in | Wt 185.0 lb

## 2017-01-29 DIAGNOSIS — M25561 Pain in right knee: Secondary | ICD-10-CM | POA: Diagnosis not present

## 2017-01-29 DIAGNOSIS — S82091A Other fracture of right patella, initial encounter for closed fracture: Secondary | ICD-10-CM

## 2017-01-29 DIAGNOSIS — M25562 Pain in left knee: Secondary | ICD-10-CM

## 2017-01-29 NOTE — Progress Notes (Signed)
Office Visit Note   Patient: Heather [name]           Date of Birth: 1941/07/19           MRN: [id number] Visit Date: 01/29/2017              Requested by: No referring provider defined for this encounter. PCP: Patient, No Pcp Per  Chief Complaint  Patient presents with  . Right Knee - Follow-up    ER s/p fall 01/01/17 right knee closed nondisplaced lower pole fx of patella      HPI:  Patient is a 76 year old woman with polymyositis she currently ambulates in a motorized wheelchair. She fell sustaining a Walsh and fracture of the inferior pole of the right patella. Patient was placed in a knee immobilizer which is not preventing her knee from flexing and is seen today for initial evaluation  Assessment & Plan: Visit Diagnoses:  1. Acute pain of left knee   2. Acute pain of right knee   3. Patellar sleeve fracture, right, closed, initial encounter     Plan:  The knee immobilizer was applied properly she will wear this for transfers. Otherwise patient is seated or at night does not have to wear the immobilizer. Plan to follow-up in the office in 3 weeks with 2 view radiographs of the right patella AP and lateral. Anticipate discontinuing the immobilizer at follow-up.  Follow-Up Instructions: Return in about 3 weeks (around 02/19/2017).   Ortho Exam  Patient is alert, oriented, no adenopathy, well-dressed, normal affect, normal respiratory effort.  On examination patient has significant weakness in both lower extremities she has no active elevation or flexion. On palpation the right knee the patella sleeve fracture is palpable but there is no displacement there is no knee effusion. Examination the left knee there is no tenderness no redness no ecchymosis or bruising on either knee.  Imaging: Xr Knee 1-2 Views Left  Result Date: 01/29/2017  Two-view radiographs of the left knee shows no evidence of a fracture of the patella. The tibial plateau has no fracture.  Xr Knee 1-2  Views Right  Result Date: 01/29/2017  AP radiograph of the right knee shows no displacement of the patella. The lateral radiograph was not obtained.   Labs: Lab Results  Component Value Date   LABURIC 7.8 (H) 06/25/2008   REPTSTATUS 04/16/2007 FINAL 04/14/2007   CULT NO GROWTH 04/14/2007   LABORGA CITROBACTER KOSERI 10/20/2013    Orders:  Orders Placed This Encounter  Procedures  . XR Knee 1-2 Views Right  . XR Knee 1-2 Views Left   No orders of the defined types were placed in this encounter.    Procedures: No procedures performed  Clinical Data: No additional findings.  ROS:  All other systems negative, except as noted in the HPI. Review of Systems  Objective: Vital Signs: Ht 5\' 10"  (1.778 m)   Wt 185 lb (83.9 kg)   LMP 11/08/1978   BMI 26.54 kg/m   Specialty Comments:  No specialty comments available.  PMFS History: Patient Active Problem List   Diagnosis Date Noted  . Headache 09/22/2014  . Anemia of other chronic disease 04/09/2014  . Weakness 03/23/2014  . Pre-syncope 03/23/2014  . Orthostasis 03/23/2014  . Upper airway cough syndrome 10/20/2013  . 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 UTI (lower urinary tract infection) 04/28/2013  . Dermatitis 03/10/2013  . 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 Chronic sore throat 08/14/2012  .  Chronic cough 08/14/2012  . 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 Swollen lymph nodes 05/28/2012  . Other dysphagia 02/13/2011  . Routine health maintenance 11/08/2010  . History of PSVT (paroxysmal supraventricular tachycardia) 09/08/2010  . NASH (nonalcoholic steatohepatitis)   . Gout   . LGSIL (low grade squamous intraepithelial lesion) on Pap smear   . ESOPHAGEAL STENOSIS 06/25/2008  . Major depression in remission ([place]) 11/16/2006  . GLAUCOMA NOS 11/16/2006  . Essential hypertension 11/16/2006  . ALLERGIC RHINITIS, SEASONAL 11/16/2006  .  GERD 11/16/2006   Past Medical History:  Diagnosis Date  . Back pain   . Chronic venous insufficiency    Present for years. Norvasc D/C'd 2012 thinking it might be contributing.  . Depression    Controlled with SSRI  . 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 GERD (gastroesophageal reflux disease)    Chronic PPI's  . Glaucoma   . Gout    No crystal dx. R podegra x 3 episodes around 2010. Did not take the allopurinol that was rec 2011.  Marland Kitchen History of PSVT (paroxysmal supraventricular tachycardia)    Remote and self limited.  Marland Kitchen HTN (hypertension)   . LGSIL (low grade squamous intraepithelial lesion) on Pap smear 02/06/07   HPV Gyn 8/08 negative  . Migraines   . Muscle weakness   . NASH (nonalcoholic steatohepatitis)    Per [place] 10/10  . Neuropathy   . Polymyositis ([place])   . S/P dilatation of esophageal stricture    7/12 Dr Carlean Purl. Dilation of tight stricture proximal eso.  Prior dilation 09 and 08.  . Seasonal allergies    Zyrtec D PRN    Family History  Problem Relation Age of Onset  . Heart disease Mother   . Hypertension Mother   . Heart attack Mother   . Heart disease Father   . Hypertension Father   . Stroke Father   . Heart disease Sister   . Hypertension Sister   . Throat cancer Brother   . Hypertension Brother   . Heart disease Brother   . Colon cancer Neg Hx     Past Surgical History:  Procedure Laterality Date  . ABDOMINAL HYSTERECTOMY  1980   2/2 menorrhagia  . BLADDER REPAIR    . CHOLECYSTECTOMY    . COLONOSCOPY  09/28/2006   diverticulosis  . ESOPHAGOGASTRODUODENOSCOPY  09/28/2006; 07/17/2008   dilation of proximal stenosis/cricopharyngeal achalasais both times, occult ring, ? dyspmotility of esophagus  . ESOPHAGOGASTRODUODENOSCOPY N/A 05/06/2013   Procedure: ESOPHAGOGASTRODUODENOSCOPY (EGD) Probable Venia Minks.;  Surgeon: Jerene Bears, MD;  Location: [facility];  Service: Gastroenterology;  Laterality: N/A;  . EYE  SURGERY     cataracts, glaucoma  . KNEE ARTHROSCOPY    . LAPAROSCOPIC BURCH PROCEDURE  04/02/07   Burch colposuspension. Inadvertant bladder damage - bladder repair, left ureteral stent placement, .  . MUSCLE BIOPSY Right 11/18/2012   Procedure: right vastus lateralis muscle biopsy ;  Surgeon: Haywood Lasso, MD;  Location: [place];  Service: General;  Laterality: Right;  . TUBAL LIGATION     Social History   Occupational History  .      Retired   Social History Main Topics  . Smoking status: Former Smoker    Types: Cigarettes    Quit date: 08/07/1968  . Smokeless tobacco: Never Used  . Alcohol use No  . Drug use: No  . Sexual activity: No

## 2017-02-16 ENCOUNTER — Ambulatory Visit (INDEPENDENT_AMBULATORY_CARE_PROVIDER_SITE_OTHER): Payer: Medicare Other | Admitting: Internal Medicine

## 2017-02-16 ENCOUNTER — Encounter: Payer: Self-pay | Admitting: Internal Medicine

## 2017-02-16 VITALS — BP 130/74 | HR 84 | Ht 69.0 in | Wt 187.5 lb

## 2017-02-16 DIAGNOSIS — Z1211 Encounter for screening for malignant neoplasm of colon: Secondary | ICD-10-CM | POA: Diagnosis not present

## 2017-02-16 DIAGNOSIS — G7241 Inclusion body myositis [IBM]: Secondary | ICD-10-CM | POA: Diagnosis not present

## 2017-02-16 NOTE — Patient Instructions (Signed)
   We have decided not to pursue any more testing for colon cancer screening today.  Let me know if you see any bleeding in your bowel movements or have other digestive issues that you need help with.  I appreciate the opportunity to care for you. Gatha Mayer, MD, [facility]

## 2017-02-16 NOTE — Progress Notes (Signed)
Heather [name] 76 y.o. February 11, 1941 [id number]  Assessment & Plan:   Encounter Diagnoses  Name Primary?  . Colon cancer screening Yes  . Inclusion body myositis     Her inclusion body myositis makes her a poor screening candidate. We discussed the pros and cons of this, she wants nothing to do with a prep for a colonoscopy and a colonoscopy. Thus it does not make sense to do stool base tests as she indicated she is unwilling to do a colonoscopy. She and her husband and I agree that working up signs and symptoms as appropriate is the best course for her at this time. I will see her back as needed.     Subjective:   Chief Complaint:Consider colon cancer screening  HPI The very nice unfortunate 76 year old African-American woman with inclusion body myositis that keeps her in a wheelchair. She is on a liquid diet. She moves her bowels by taking magnesium psych trait every day. There is no bleeding reported. He had a negative screening colonoscopy save for diverticulosis in 2008. She came up on the recall list and I asked her to come in to discuss screening options. We did so. See the assessment and plan. Allergies  Allergen Reactions  . Propoxyphene Napsylate Nausea Only  . Darvon   . Latex Rash    Per Patient latex=rash "hands look burned"   Current Meds  Medication Sig  . amitriptyline (ELAVIL) 50 MG tablet Take 1 tablet (50 mg total) by mouth at bedtime.  Marland Kitchen amLODipine (NORVASC) 5 MG tablet Take 1 tablet (5 mg total) by mouth daily.  . brimonidine (ALPHAGAN) 0.2 % ophthalmic solution   . clonazePAM (KLONOPIN) 1 MG tablet take ONE tab BY MOUTH TWICE DAILY  . cloNIDine (CATAPRES) 0.1 MG tablet Take 1 tablet (0.1 mg total) by mouth 2 (two) times daily.  Marland Kitchen docusate sodium (COLACE) 100 MG capsule Take 1 capsule (100 mg total) by mouth every 12 (twelve) hours.  . ENSURE PLUS (ENSURE PLUS) LIQD Take by mouth. 3 times daily  . esomeprazole (NEXIUM) 40 MG capsule Take 1 capsule  (40 mg total) by mouth daily before breakfast.  . FLUoxetine (PROZAC) 40 MG capsule Take 40 mg by mouth.  Marland Kitchen HYDROcodone-acetaminophen (HYCET) 7.5-325 mg/15 ml solution 15 ml every 4-6 hours as needed for pain  . latanoprost (XALATAN) 0.005 % ophthalmic solution Place 1 drop into the left eye at bedtime.   Marland Kitchen levocetirizine (XYZAL) 5 MG tablet TAKE ONE TABLET BY MOUTH ONCE DAILY AS NEEDED FOR  ALLERGIES  . metoprolol (LOPRESSOR) 100 MG tablet Take 1 tablet (100 mg total) by mouth 2 (two) times daily.  . Multiple Vitamin (MULTI-VITAMINS) TABS TAKE 1 TABLET EVERY DAY  . Multiple Vitamin (MULTIVITAMIN) capsule Take 1 capsule by mouth daily.  . ondansetron (ZOFRAN ODT) 4 MG disintegrating tablet Take 1 tab every 6-8 hours as needed for nausea.  . pantoprazole (PROTONIX) 40 MG tablet Take 1 tablet (40 mg total) by mouth daily.  . polyethylene glycol (MIRALAX / GLYCOLAX) packet Take 17 g by mouth daily.  Marland Kitchen zolpidem (AMBIEN) 5 MG tablet TAKE ONE TABLET BY MOUTH AT BEDTIME AS NEEDED.   Past Medical History:  Diagnosis Date  . Back pain   . Chronic venous insufficiency    Present for years. Norvasc D/C'd 2012 thinking it might be contributing.  . Depression    Controlled with SSRI  . 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 GERD (gastroesophageal reflux disease)  Chronic PPI's  . Glaucoma   . Gout    No crystal dx. R podegra x 3 episodes around 2010. Did not take the allopurinol that was rec 2011.  Marland Kitchen History of PSVT (paroxysmal supraventricular tachycardia)    Remote and self limited.  Marland Kitchen HTN (hypertension)   . LGSIL (low grade squamous intraepithelial lesion) on Pap smear 02/06/07   HPV Gyn 8/08 negative  . Migraines   . Muscle weakness   . NASH (nonalcoholic steatohepatitis)    Per [place] 10/10  . Neuropathy   . Polymyositis ([place])   . S/P dilatation of esophageal stricture    7/12 Dr Carlean Purl. Dilation of tight stricture proximal eso.  Prior dilation 09  and 08.  . Seasonal allergies    Zyrtec D PRN   Past Surgical History:  Procedure Laterality Date  . ABDOMINAL HYSTERECTOMY  1980   2/2 menorrhagia  . BLADDER REPAIR    . CHOLECYSTECTOMY    . COLONOSCOPY  09/28/2006   diverticulosis  . ESOPHAGOGASTRODUODENOSCOPY  09/28/2006; 07/17/2008   dilation of proximal stenosis/cricopharyngeal achalasais both times, occult ring, ? dyspmotility of esophagus  . ESOPHAGOGASTRODUODENOSCOPY N/A 05/06/2013   Procedure: ESOPHAGOGASTRODUODENOSCOPY (EGD) Probable Venia Minks.;  Surgeon: Jerene Bears, MD;  Location: [facility];  Service: Gastroenterology;  Laterality: N/A;  . EYE SURGERY     cataracts, glaucoma  . KNEE ARTHROSCOPY    . LAPAROSCOPIC BURCH PROCEDURE  04/02/07   Burch colposuspension. Inadvertant bladder damage - bladder repair, left ureteral stent placement, .  . MUSCLE BIOPSY Right 11/18/2012   Procedure: right vastus lateralis muscle biopsy ;  Surgeon: Haywood Lasso, MD;  Location: [place];  Service: General;  Laterality: Right;  . TUBAL LIGATION     Review of Systems Wheelchair-bound. Has a lot of weakness. Has to stay on a liquid diet. Wears eyeglasses.  Objective:   Physical Exam BP 130/74   Pulse 84   Ht 5\' 9"  (1.753 m)   Wt 187 lb 8 oz (85 kg)   LMP 11/08/1978   BMI 27.69 kg/m  Chronically ill black woman in a motorized wheelchair  10 minutes time spent with patient > half in counseling coordination of care

## 2017-02-19 ENCOUNTER — Ambulatory Visit (INDEPENDENT_AMBULATORY_CARE_PROVIDER_SITE_OTHER): Payer: Medicare Other

## 2017-02-19 ENCOUNTER — Encounter (INDEPENDENT_AMBULATORY_CARE_PROVIDER_SITE_OTHER): Payer: Self-pay | Admitting: Family

## 2017-02-19 ENCOUNTER — Ambulatory Visit (INDEPENDENT_AMBULATORY_CARE_PROVIDER_SITE_OTHER): Payer: Medicare Other | Admitting: Family

## 2017-02-19 VITALS — Ht 69.0 in | Wt 187.0 lb

## 2017-02-19 DIAGNOSIS — S82091D Other fracture of right patella, subsequent encounter for closed fracture with routine healing: Secondary | ICD-10-CM | POA: Diagnosis not present

## 2017-02-19 NOTE — Progress Notes (Signed)
Office Visit Note   Patient: Heather [name]           Date of Birth: July 09, 1941           MRN: [id number] Visit Date: 02/19/2017              Requested by: No referring provider defined for this encounter. PCP: Patient, No Pcp Per  Chief Complaint  Patient presents with  . 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 She fell And sustained a fracture of the inferior pole of the right patella. Has been in a knee immobilizer for the last month. Does complain of continued weakness to bilateral "knees" and aching pain. states is working with physical therapy.   Assessment & Plan: Visit Diagnoses:  1. Patellar sleeve fracture, right, closed, with routine healing, subsequent encounter     Plan:  Will discontinue the immobilizer today. Progress with PT as tolerated. Follow up in 4 weeks if ongoing pain or concerns.   Follow-Up Instructions: Return in about 4 weeks (around 03/19/2017), or if symptoms worsen or fail to improve.   Ortho Exam  Patient is alert, oriented, no adenopathy, well-dressed, normal affect, normal respiratory effort.  On examination patient has significant weakness in both lower extremities she has no active elevation or flexion. On palpation the right knee the patella is mildly tender to palpation. there is no knee effusion. Examination the left knee there is no tenderness no redness no ecchymosis or bruising on either knee.  Imaging: Xr Patella Right  Result Date: 02/19/2017 Radiographs of the right patella show stable alignment. No displacement.   Labs: Lab Results  Component Value Date   LABURIC 7.8 (H) 06/25/2008   REPTSTATUS 04/16/2007 FINAL 04/14/2007   CULT NO GROWTH 04/14/2007   LABORGA CITROBACTER KOSERI 10/20/2013    Orders:  Orders Placed This Encounter  Procedures  .  XR PATELLA RIGHT   No orders of the defined types were placed in this encounter.    Procedures: No procedures performed  Clinical Data: No additional findings.  ROS:  All other systems negative, except as noted in the HPI. Review of Systems  Constitutional: Negative for chills and fever.  Musculoskeletal: Positive for arthralgias. Negative for joint swelling.  Neurological: Positive for weakness.    Objective: Vital Signs: Ht 5\' 9"  (1.753 m)   Wt 187 lb (84.8 kg)   LMP 11/08/1978   BMI 27.62 kg/m   Specialty Comments:  No specialty comments available.  PMFS History: Patient Active Problem List   Diagnosis Date Noted  . Headache 09/22/2014  . Anemia of other chronic disease 04/09/2014  . Weakness 03/23/2014  . Pre-syncope 03/23/2014  . Orthostasis 03/23/2014  . Upper airway cough syndrome 10/20/2013  . 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 UTI (lower urinary tract infection) 04/28/2013  . Dermatitis 03/10/2013  . 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 Chronic sore throat 08/14/2012  . Chronic cough 08/14/2012  . 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 Swollen lymph nodes 05/28/2012  . Other dysphagia 02/13/2011  . Routine health maintenance 11/08/2010  . History of PSVT (paroxysmal supraventricular tachycardia) 09/08/2010  . NASH (nonalcoholic steatohepatitis)   . Gout   . LGSIL (low grade squamous intraepithelial lesion) on Pap smear   . ESOPHAGEAL STENOSIS  06/25/2008  . Major depression in remission ([place]) 11/16/2006  . GLAUCOMA NOS 11/16/2006  . Essential hypertension 11/16/2006  . ALLERGIC RHINITIS, SEASONAL 11/16/2006  . GERD 11/16/2006   Past Medical History:  Diagnosis Date  . Back pain   . Chronic venous insufficiency    Present for years. Norvasc D/C'd 2012 thinking it might be contributing.  . Depression    Controlled with SSRI  . Dysphagia, pharyngoesophageal phase- due to  inclusion body myositis 05/19/2013  . GERD (gastroesophageal reflux disease)    Chronic PPI's  . Glaucoma   . Gout    No crystal dx. R podegra x 3 episodes around 2010. Did not take the allopurinol that was rec 2011.  Marland Kitchen History of PSVT (paroxysmal supraventricular tachycardia)    Remote and self limited.  Marland Kitchen HTN (hypertension)   . LGSIL (low grade squamous intraepithelial lesion) on Pap smear 02/06/07   HPV Gyn 8/08 negative  . Migraines   . Muscle weakness   . NASH (nonalcoholic steatohepatitis)    Per [place] 10/10  . Neuropathy   . Polymyositis ([place])   . S/P dilatation of esophageal stricture    7/12 Dr Carlean Purl. Dilation of tight stricture proximal eso.  Prior dilation 09 and 08.  . Seasonal allergies    Zyrtec D PRN    Family History  Problem Relation Age of Onset  . Heart disease Mother   . Hypertension Mother   . Heart attack Mother   . Heart disease Father   . Hypertension Father   . Stroke Father   . Heart disease Sister   . Hypertension Sister   . Throat cancer Brother   . Hypertension Brother   . Heart disease Brother   . Colon cancer Neg Hx     Past Surgical History:  Procedure Laterality Date  . ABDOMINAL HYSTERECTOMY  1980   2/2 menorrhagia  . BLADDER REPAIR    . CHOLECYSTECTOMY    . COLONOSCOPY  09/28/2006   diverticulosis  . ESOPHAGOGASTRODUODENOSCOPY  09/28/2006; 07/17/2008   dilation of proximal stenosis/cricopharyngeal achalasais both times, occult ring, ? dyspmotility of esophagus  . ESOPHAGOGASTRODUODENOSCOPY N/A 05/06/2013   Procedure: ESOPHAGOGASTRODUODENOSCOPY (EGD) Probable Venia Minks.;  Surgeon: Jerene Bears, MD;  Location: [facility];  Service: Gastroenterology;  Laterality: N/A;  . EYE SURGERY     cataracts, glaucoma  . KNEE ARTHROSCOPY    . LAPAROSCOPIC BURCH PROCEDURE  04/02/07   Burch colposuspension. Inadvertant bladder damage - bladder repair, left ureteral stent placement, .  . MUSCLE BIOPSY Right 11/18/2012   Procedure: right vastus lateralis  muscle biopsy ;  Surgeon: Haywood Lasso, MD;  Location: [facility];  Service: General;  Laterality: Right;  . TUBAL LIGATION     Social History   Occupational History  . retired     Retired   Social History Main Topics  . Smoking status: Former Smoker    Types: Cigarettes    Quit date: 08/07/1968  . Smokeless tobacco: Never Used  . Alcohol use No  . Drug use: No  . Sexual activity: No

## 2017-05-10 ENCOUNTER — Emergency Department (HOSPITAL_COMMUNITY)
Admission: EM | Admit: 2017-05-10 | Discharge: 2017-05-11 | Disposition: A | Discharge status: Home / Self Care | Payer: Medicare Other | Attending: Emergency Medicine | Admitting: Emergency Medicine

## 2017-05-10 ENCOUNTER — Encounter (HOSPITAL_COMMUNITY): Payer: Self-pay | Admitting: Family Medicine

## 2017-05-10 DIAGNOSIS — Y939 Activity, unspecified: Secondary | ICD-10-CM | POA: Diagnosis not present

## 2017-05-10 DIAGNOSIS — S0083XA Contusion of other part of head, initial encounter: Secondary | ICD-10-CM | POA: Diagnosis not present

## 2017-05-10 DIAGNOSIS — Z79899 Other long term (current) drug therapy: Secondary | ICD-10-CM | POA: Diagnosis not present

## 2017-05-10 DIAGNOSIS — Y929 Unspecified place or not applicable: Secondary | ICD-10-CM | POA: Diagnosis not present

## 2017-05-10 DIAGNOSIS — Y999 Unspecified external cause status: Secondary | ICD-10-CM | POA: Diagnosis not present

## 2017-05-10 DIAGNOSIS — I10 Essential (primary) hypertension: Secondary | ICD-10-CM | POA: Insufficient documentation

## 2017-05-10 DIAGNOSIS — Z91040 Latex allergy status: Secondary | ICD-10-CM | POA: Diagnosis not present

## 2017-05-10 DIAGNOSIS — W19XXXA Unspecified fall, initial encounter: Secondary | ICD-10-CM

## 2017-05-10 DIAGNOSIS — Z87891 Personal history of nicotine dependence: Secondary | ICD-10-CM | POA: Insufficient documentation

## 2017-05-10 DIAGNOSIS — S0990XA Unspecified injury of head, initial encounter: Secondary | ICD-10-CM | POA: Diagnosis present

## 2017-05-10 NOTE — ED Triage Notes (Addendum)
Patient is from home and transported via [facility]. Per EMS, patient was getting into a wheelchair when she had a witnessed fell. Patient was found lying prone when EMS arrived . Patient has a contusion to her left forehead with no LOC. Does not take a blood thinner. Patient is in A-fib but it currently being treated. Also, has a collar applied by EMS.Per patient was in her motorized wheelchair, attempting to get off it to use the restroom. Patient didn't turn disengage wheelchair and fell off of it. Patient reports her head hit the wall.

## 2017-05-10 NOTE — ED Provider Notes (Signed)
[facility] DEPT Provider Note   CSN: [id number] Arrival date & time: 05/10/17  2237     History   Chief Complaint Chief Complaint  Patient presents with  . Fall    HPI Heather FAGERSTROM is a 76 y.o. female.  HPI  76 year old female who presents to the emergency department after a mechanical fall from standing resulting in head trauma. This occurred approximately one half hours prior to arrival. Patient reports that she was trying to get up from her automated wheelchair. She reports that she did not knock her wheelchair and when she tried to get out of the wheelchair started moving causing her to fall. She denied any loss of consciousness. No anticoagulation. She is endorsing left forehead pain that is exacerbated with palpation of the area. No alleviating factors. Denies any other physical complaints or injuries.  Past Medical History:  Diagnosis Date  . Back pain   . Chronic venous insufficiency    Present for years. Norvasc D/C'd 2012 thinking it might be contributing.  . Depression    Controlled with SSRI  . 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 GERD (gastroesophageal reflux disease)    Chronic PPI's  . Glaucoma   . Gout    No crystal dx. R podegra x 3 episodes around 2010. Did not take the allopurinol that was rec 2011.  Marland Kitchen History of PSVT (paroxysmal supraventricular tachycardia)    Remote and self limited.  Marland Kitchen HTN (hypertension)   . LGSIL (low grade squamous intraepithelial lesion) on Pap smear 02/06/07   HPV Gyn 8/08 negative  . Migraines   . Muscle weakness   . NASH (nonalcoholic steatohepatitis)    Per [place] 10/10  . Neuropathy   . Polymyositis ([place])   . S/P dilatation of esophageal stricture    7/12 Dr Carlean Purl. Dilation of tight stricture proximal eso.  Prior dilation 09 and 08.  . Seasonal allergies    Zyrtec D PRN    Patient Active Problem List   Diagnosis Date Noted  . Headache 09/22/2014  . Anemia of  other chronic disease 04/09/2014  . Weakness 03/23/2014  . Pre-syncope 03/23/2014  . Orthostasis 03/23/2014  . Upper airway cough syndrome 10/20/2013  . 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 UTI (lower urinary tract infection) 04/28/2013  . Dermatitis 03/10/2013  . 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 Chronic sore throat 08/14/2012  . Chronic cough 08/14/2012  . 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 Swollen lymph nodes 05/28/2012  . Other dysphagia 02/13/2011  . Routine health maintenance 11/08/2010  . History of PSVT (paroxysmal supraventricular tachycardia) 09/08/2010  . NASH (nonalcoholic steatohepatitis)   . Gout   . LGSIL (low grade squamous intraepithelial lesion) on Pap smear   . ESOPHAGEAL STENOSIS 06/25/2008  . Major depression in remission ([place]) 11/16/2006  . GLAUCOMA NOS 11/16/2006  . Essential hypertension 11/16/2006  . ALLERGIC RHINITIS, SEASONAL 11/16/2006  . GERD 11/16/2006    Past Surgical History:  Procedure Laterality Date  . ABDOMINAL HYSTERECTOMY  1980   2/2 menorrhagia  . BLADDER REPAIR    . CHOLECYSTECTOMY    . COLONOSCOPY  09/28/2006   diverticulosis  . ESOPHAGOGASTRODUODENOSCOPY  09/28/2006; 07/17/2008   dilation of proximal stenosis/cricopharyngeal achalasais both times, occult ring, ? dyspmotility of esophagus  . ESOPHAGOGASTRODUODENOSCOPY N/A 05/06/2013   Procedure: ESOPHAGOGASTRODUODENOSCOPY (EGD) Probable Venia Minks.;  Surgeon: Jerene Bears, MD;  Location: [facility];  Service: Gastroenterology;  Laterality: N/A;  .  EYE SURGERY     cataracts, glaucoma  . KNEE ARTHROSCOPY    . LAPAROSCOPIC BURCH PROCEDURE  04/02/07   Burch colposuspension. Inadvertant bladder damage - bladder repair, left ureteral stent placement, .  . MUSCLE BIOPSY Right 11/18/2012   Procedure: right vastus lateralis muscle biopsy ;  Surgeon: Haywood Lasso, MD;  Location: [facility];  Service: General;   Laterality: Right;  . TUBAL LIGATION      OB History    No data available       Home Medications    Prior to Admission medications   Medication Sig Start Date End Date Taking? Authorizing Provider  amitriptyline (ELAVIL) 50 MG tablet Take 1 tablet (50 mg total) by mouth at bedtime. 11/25/14  Yes Jessee Avers, MD  amLODipine (NORVASC) 5 MG tablet Take 1 tablet (5 mg total) by mouth daily. 11/07/14  Yes [name], MD  brimonidine (ALPHAGAN) 0.2 % ophthalmic solution Place 1 drop into both eyes daily.  10/04/15  Yes [provider]  clonazePAM (KLONOPIN) 1 MG tablet take ONE tab BY MOUTH TWICE DAILY 01/12/17  Yes [provider]  cloNIDine (CATAPRES) 0.1 MG tablet Take 1 tablet (0.1 mg total) by mouth 2 (two) times daily. 11/07/14  Yes [name], MD  docusate sodium (COLACE) 100 MG capsule Take 1 capsule (100 mg total) by mouth every 12 (twelve) hours. 01/01/17  Yes Pfeiffer, Jeannie Done, MD  ENSURE PLUS (ENSURE PLUS) LIQD Take 237 mLs by mouth 3 (three) times daily between meals.  02/21/16  Yes [provider]  FLUoxetine (PROZAC) 40 MG capsule Take 40 mg by mouth daily.    Yes [provider]  latanoprost (XALATAN) 0.005 % ophthalmic solution Place 1 drop into the left eye at bedtime.  01/19/14  Yes [provider]  metoprolol (LOPRESSOR) 100 MG tablet Take 1 tablet (100 mg total) by mouth 2 (two) times daily. 11/07/14  Yes [name], MD  Multiple Vitamin (MULTIVITAMIN) capsule Take 1 capsule by mouth daily. 05/04/14  Yes [name], MD  zolpidem (AMBIEN) 5 MG tablet TAKE ONE TABLET BY MOUTH AT BEDTIME AS NEEDED. 07/19/15  Yes Rice, Resa Miner, MD    Family History Family History  Problem Relation Age of Onset  . Heart disease Mother   . Hypertension Mother   . Heart attack Mother   . Heart disease Father   . Hypertension Father   . Stroke Father   . Heart disease Sister   . Hypertension Sister   . Throat cancer Brother   .  Hypertension Brother   . Heart disease Brother   . Colon cancer Neg Hx     Social History Social History  Substance Use Topics  . Smoking status: Former Smoker    Types: Cigarettes    Quit date: 08/07/1968  . Smokeless tobacco: Never Used  . Alcohol use No     Allergies   Propoxyphene napsylate; Darvon; and Latex   Review of Systems Review of Systems All other systems are reviewed and are negative for acute change except as noted in the HPI   Physical Exam Updated Vital Signs BP (!) 151/77 (BP Location: Left Arm)   Pulse 87   Temp 97.6 F (36.4 C) (Oral)   Resp 18   Ht 5\' 10"  (1.778 m)   Wt 81.6 kg (180 lb)   LMP 11/08/1978   SpO2 97%   BMI 25.83 kg/m   Physical Exam  Constitutional: She is oriented to person, place, and  time. She appears well-developed and well-nourished. No distress. Cervical collar in place.  HENT:  Head: Normocephalic. Head is with contusion.    Right Ear: External ear normal.  Left Ear: External ear normal.  Nose: Nose normal.  Mouth/Throat: Abnormal dentition (decaying teeth).  No malocclusion, no hemotympanum, no nasal septal hematoma.  Eyes: Pupils are equal, round, and reactive to light. Conjunctivae and EOM are normal. Right eye exhibits no discharge. Left eye exhibits no discharge. No scleral icterus.  Neck: Normal range of motion. Neck supple.  Cardiovascular: Normal rate, regular rhythm and normal heart sounds.  Exam reveals no gallop and no friction rub.   No murmur heard. Pulses:      Radial pulses are 2+ on the right side, and 2+ on the left side.       Dorsalis pedis pulses are 2+ on the right side, and 2+ on the left side.  Pulmonary/Chest: Effort normal and breath sounds normal. No stridor. No respiratory distress. She has no wheezes.  Abdominal: Soft. She exhibits no distension. There is no tenderness.  Musculoskeletal: She exhibits no edema or tenderness.       Cervical back: She exhibits no bony tenderness.        Thoracic back: She exhibits no bony tenderness.       Lumbar back: She exhibits no bony tenderness.  Clavicles stable. Chest stable to AP/Lat compression. Pelvis stable to Lat compression. No obvious extremity deformity. No chest or abdominal wall contusion.  Neurological: She is alert and oriented to person, place, and time.  Moving all extremities  Skin: Skin is warm and dry. No rash noted. She is not diaphoretic. No erythema.  Psychiatric: She has a normal mood and affect.     ED Treatments / Results  Labs (all labs ordered are listed, but only abnormal results are displayed) Labs Reviewed - No data to display  EKG  EKG Interpretation None       Radiology No results found.  Procedures Procedures (including critical care time)  Medications Ordered in ED Medications  acetaminophen (TYLENOL) solution 650 mg (not administered)     Initial Impression / Assessment and Plan / ED Course  I have reviewed the triage vital signs and the nursing notes.  Pertinent labs & imaging results that were available during my care of the patient were reviewed by me and considered in my medical decision making (see chart for details).     The patient is alert and oriented, without evidence of intoxication. They have no midline cervical tenderness, distracting injuries, or neuro deficits. Cervical spine cleared by Nexus criteria. Collar removed.  Minor closed head injury. Normal neuro exam. no anticoagulation. No indication for imaging at this time.   Doubt intracranial bleed or skull fracture.   Will monitor in the ED for any changes.  After 3 hrs of monitoring after the fall, there were no changes that would warrant imaging or admission for further observation. The patient is appropriate for discharge home.  Pt given information on head trauma including strict instructions to return for nausea/vomiting, confusion, altered level of consciousness or new weakness. Pt  understand and  are agreeable to the plan.    Final Clinical Impressions(s) / ED Diagnoses   Final diagnoses:  Fall, initial encounter  Contusion of face, initial encounter   Disposition: Discharge  Condition: Good  I have discussed the results, Dx and Tx plan with the patient who expressed understanding and agree(s) with the plan. Discharge instructions discussed at great  length. The patient was given strict return precautions who verbalized understanding of the instructions. No further questions at time of discharge.    New Prescriptions   No medications on file    Follow Up: Elwyn Reach, [place] [address] [phone]  Schedule an appointment as soon as possible for a visit  As needed      Jonmichael Beadnell, Grayce Sessions, MD 05/11/17 [phone]

## 2017-05-10 NOTE — ED Notes (Signed)
Bed: WA20 Expected date:  Expected time:  Means of arrival:  Comments: EMS 76 yo female from home/fell out of wheelchair-headache-no blood thinners

## 2017-05-11 MED ORDER — ACETAMINOPHEN 160 MG/5ML PO SOLN
650.0000 mg | Freq: Once | ORAL | Status: AC
  Administered 2017-05-11: 650 mg via ORAL
  Filled 2017-05-11: qty 20.3

## 2018-02-06 ENCOUNTER — Emergency Department (HOSPITAL_COMMUNITY): Payer: Medicare Other

## 2018-02-06 ENCOUNTER — Emergency Department (HOSPITAL_COMMUNITY)
Admission: EM | Admit: 2018-02-06 | Discharge: 2018-02-07 | Disposition: A | Discharge status: Home / Self Care | Payer: Medicare Other | Attending: Emergency Medicine | Admitting: Emergency Medicine

## 2018-02-06 ENCOUNTER — Encounter (HOSPITAL_COMMUNITY): Payer: Self-pay | Admitting: Emergency Medicine

## 2018-02-06 DIAGNOSIS — J4520 Mild intermittent asthma, uncomplicated: Secondary | ICD-10-CM | POA: Insufficient documentation

## 2018-02-06 DIAGNOSIS — Z87891 Personal history of nicotine dependence: Secondary | ICD-10-CM | POA: Diagnosis not present

## 2018-02-06 DIAGNOSIS — Z91040 Latex allergy status: Secondary | ICD-10-CM | POA: Diagnosis not present

## 2018-02-06 DIAGNOSIS — I10 Essential (primary) hypertension: Secondary | ICD-10-CM | POA: Diagnosis not present

## 2018-02-06 DIAGNOSIS — R0602 Shortness of breath: Secondary | ICD-10-CM | POA: Diagnosis present

## 2018-02-06 DIAGNOSIS — R079 Chest pain, unspecified: Secondary | ICD-10-CM | POA: Insufficient documentation

## 2018-02-06 DIAGNOSIS — Z79899 Other long term (current) drug therapy: Secondary | ICD-10-CM | POA: Diagnosis not present

## 2018-02-06 MED ORDER — IPRATROPIUM-ALBUTEROL 0.5-2.5 (3) MG/3ML IN SOLN
3.0000 mL | Freq: Once | RESPIRATORY_TRACT | Status: AC
  Administered 2018-02-06: 3 mL via RESPIRATORY_TRACT
  Filled 2018-02-06: qty 3

## 2018-02-06 NOTE — ED Notes (Signed)
MD at bedside. 

## 2018-02-06 NOTE — ED Triage Notes (Signed)
Patient c/o central chest pressure with SOB x1 hour.

## 2018-02-06 NOTE — ED Provider Notes (Signed)
[facility] DEPT Provider Note   CSN: [id number] Arrival date & time: 02/06/18  2148     History   Chief Complaint Chief Complaint  Patient presents with  . Chest Pain    HPI Heather [name] is a 77 y.o. female.  The history is provided by the patient and the spouse. No language interpreter was used.  Chest Pain       77 year old female with history of polymyositis who is wheelchair-bound, depression, GERD, hypertension presenting for evaluation of shortness of breath.  Patient report prior to arrival, she was at home in the kitchen, cleaning up dust powder that they put out to get rid of sleep.  She then developed acute onset of shortness of breath, cough, and chest tightness.  Symptom seems to be brought on by the powder and she report feeling a bit better now that she is in the ED.  She rates the discomfort as 3 out of 10.  No associated fever, chills, lightheadedness, dizziness, productive cough, hemoptysis, pleuritic chest pain, abdominal or back pain.  No significant cardiac history.  No specific treatment tried prior to arrival.  No prior history of PE or DVT, no recent surgery, prolonged bedrest, active cancer or hemoptysis.  Past Medical History:  Diagnosis Date  . Back pain   . Chronic venous insufficiency    Present for years. Norvasc D/C'd 2012 thinking it might be contributing.  . Depression    Controlled with SSRI  . 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 GERD (gastroesophageal reflux disease)    Chronic PPI's  . Glaucoma   . Gout    No crystal dx. R podegra x 3 episodes around 2010. Did not take the allopurinol that was rec 2011.  Marland Kitchen History of PSVT (paroxysmal supraventricular tachycardia)    Remote and self limited.  Marland Kitchen HTN (hypertension)   . LGSIL (low grade squamous intraepithelial lesion) on Pap smear 02/06/07   HPV Gyn 8/08 negative  . Migraines   . Muscle weakness   . NASH  (nonalcoholic steatohepatitis)    Per [place] 10/10  . Neuropathy   . Polymyositis ([place])   . S/P dilatation of esophageal stricture    7/12 Dr Carlean Purl. Dilation of tight stricture proximal eso.  Prior dilation 09 and 08.  . Seasonal allergies    Zyrtec D PRN    Patient Active Problem List   Diagnosis Date Noted  . Headache 09/22/2014  . Anemia of other chronic disease 04/09/2014  . Weakness 03/23/2014  . Pre-syncope 03/23/2014  . Orthostasis 03/23/2014  . Upper airway cough syndrome 10/20/2013  . 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 UTI (lower urinary tract infection) 04/28/2013  . Dermatitis 03/10/2013  . 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 Chronic sore throat 08/14/2012  . Chronic cough 08/14/2012  . 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 Swollen lymph nodes 05/28/2012  . Other dysphagia 02/13/2011  . Routine health maintenance 11/08/2010  . History of PSVT (paroxysmal supraventricular tachycardia) 09/08/2010  . NASH (nonalcoholic steatohepatitis)   . Gout   . LGSIL (low grade squamous intraepithelial lesion) on Pap smear   . ESOPHAGEAL STENOSIS 06/25/2008  . Major depression in remission ([place]) 11/16/2006  . GLAUCOMA NOS 11/16/2006  . Essential hypertension 11/16/2006  . ALLERGIC RHINITIS, SEASONAL 11/16/2006  . GERD 11/16/2006    Past Surgical History:  Procedure Laterality Date  . ABDOMINAL HYSTERECTOMY  1980   2/2  menorrhagia  . BLADDER REPAIR    . CHOLECYSTECTOMY    . COLONOSCOPY  09/28/2006   diverticulosis  . ESOPHAGOGASTRODUODENOSCOPY  09/28/2006; 07/17/2008   dilation of proximal stenosis/cricopharyngeal achalasais both times, occult ring, ? dyspmotility of esophagus  . ESOPHAGOGASTRODUODENOSCOPY N/A 05/06/2013   Procedure: ESOPHAGOGASTRODUODENOSCOPY (EGD) Probable Venia Minks.;  Surgeon: Jerene Bears, MD;  Location: [facility];  Service: Gastroenterology;  Laterality: N/A;  . EYE SURGERY      cataracts, glaucoma  . KNEE ARTHROSCOPY    . LAPAROSCOPIC BURCH PROCEDURE  04/02/07   Burch colposuspension. Inadvertant bladder damage - bladder repair, left ureteral stent placement, .  . MUSCLE BIOPSY Right 11/18/2012   Procedure: right vastus lateralis muscle biopsy ;  Surgeon: Haywood Lasso, MD;  Location: [facility];  Service: General;  Laterality: Right;  . TUBAL LIGATION       OB History   None      Home Medications    Prior to Admission medications   Medication Sig Start Date End Date Taking? Authorizing Provider  amitriptyline (ELAVIL) 50 MG tablet Take 1 tablet (50 mg total) by mouth at bedtime. 11/25/14   Jessee Avers, MD  amLODipine (NORVASC) 5 MG tablet Take 1 tablet (5 mg total) by mouth daily. 11/07/14   [name], MD  brimonidine (ALPHAGAN) 0.2 % ophthalmic solution Place 1 drop into both eyes daily.  10/04/15   [provider]  clonazePAM (KLONOPIN) 1 MG tablet take ONE tab BY MOUTH TWICE DAILY 01/12/17   [provider]  cloNIDine (CATAPRES) 0.1 MG tablet Take 1 tablet (0.1 mg total) by mouth 2 (two) times daily. 11/07/14   [name], MD  docusate sodium (COLACE) 100 MG capsule Take 1 capsule (100 mg total) by mouth every 12 (twelve) hours. 01/01/17   Charlesetta Shanks, MD  ENSURE PLUS (ENSURE PLUS) LIQD Take 237 mLs by mouth 3 (three) times daily between meals.  02/21/16   [provider]  FLUoxetine (PROZAC) 40 MG capsule Take 40 mg by mouth daily.     [provider]  latanoprost (XALATAN) 0.005 % ophthalmic solution Place 1 drop into the left eye at bedtime.  01/19/14   [provider]  metoprolol (LOPRESSOR) 100 MG tablet Take 1 tablet (100 mg total) by mouth 2 (two) times daily. 11/07/14   [name], MD  Multiple Vitamin (MULTIVITAMIN) capsule Take 1 capsule by mouth daily. 05/04/14   [name], MD  zolpidem (AMBIEN) 5 MG tablet TAKE ONE TABLET BY MOUTH AT BEDTIME AS NEEDED. 07/19/15   Rice, Resa Miner, MD     Family History Family History  Problem Relation Age of Onset  . Heart disease Mother   . Hypertension Mother   . Heart attack Mother   . Heart disease Father   . Hypertension Father   . Stroke Father   . Heart disease Sister   . Hypertension Sister   . Throat cancer Brother   . Hypertension Brother   . Heart disease Brother   . Colon cancer Neg Hx     Social History Social History   Tobacco Use  . Smoking status: Former Smoker    Types: Cigarettes    Last attempt to quit: 08/07/1968    Years since quitting: 49.5  . Smokeless tobacco: Never Used  Substance Use Topics  . Alcohol use: No  . Drug use: No     Allergies   Propoxyphene napsylate; Darvon; and Latex   Review of Systems Review of Systems  Cardiovascular: Positive for chest pain.  All other systems reviewed and are negative.    Physical Exam Updated Vital Signs BP (!) 142/70 (BP Location: Left Arm)   Pulse 85   Temp 98.4 F (36.9 C) (Oral)   Resp 20   Ht 5\' 10"  (1.778 m)   Wt 81.6 kg (180 lb)   LMP 11/08/1978   SpO2 100%   BMI 25.83 kg/m   Physical Exam  Constitutional: She appears well-developed and well-nourished. No distress.  HENT:  Head: Atraumatic.  Eyes: Conjunctivae are normal.  Neck: Neck supple.  Cardiovascular: Normal rate, regular rhythm, intact distal pulses and normal pulses.  Pulmonary/Chest: Effort normal. No respiratory distress. She has no decreased breath sounds. She has no wheezes. She has no rhonchi. She has no rales.  Abdominal: Soft. There is no tenderness.  Musculoskeletal:       Right lower leg: She exhibits edema.       Left lower leg: She exhibits edema.  Baseline 1+ pitting edema to bilateral lower extremities  Lymphadenopathy:    She has no cervical adenopathy.  Neurological: She is alert.  Skin: Skin is warm. No rash noted.  Psychiatric: She has a normal mood and affect.  Nursing note and vitals reviewed.    ED Treatments / Results  Labs (all labs  ordered are listed, but only abnormal results are displayed) Labs Reviewed  BASIC METABOLIC PANEL - Abnormal; Notable for the following components:      Result Value   Creatinine, Ser 0.31 (*)    All other components within normal limits  CBC - Abnormal; Notable for the following components:   RBC 3.85 (*)    All other components within normal limits  I-STAT TROPONIN, ED  I-STAT TROPONIN, ED    EKG None  ED ECG REPORT   Date: 02/07/2018  Rate: 84  Rhythm: normal sinus rhythm  QRS Axis: normal  Intervals: PR prolonged  ST/T Wave abnormalities: normal  Conduction Disutrbances:none  Narrative Interpretation:   Old EKG Reviewed: unchanged  I have personally reviewed the EKG tracing and agree with the computerized printout as noted.   Radiology Dg Chest 2 View  Result Date: 02/06/2018 CLINICAL DATA:  Central chest pressure with shortness of breath for 1 hour. Previous smoker. EXAM: CHEST - 2 VIEW COMPARISON:  11/12/2012 FINDINGS: Shallow inspiration with elevation of left hemidiaphragm. Mild atelectasis in the lung bases. Mild cardiac enlargement. No pulmonary vascular congestion or edema. No blunting of costophrenic angles. No pneumothorax. Mediastinal contours appear intact. Degenerative changes in the spine and shoulders. IMPRESSION: Shallow inspiration with elevation of left hemidiaphragm. Linear atelectasis in the lung bases. No evidence of active pulmonary disease. Electronically Signed   By: Lucienne Capers M.D.   On: 02/06/2018 23:05    Procedures Procedures (including critical care time)  Medications Ordered in ED Medications  ipratropium-albuterol (DUONEB) 0.5-2.5 (3) MG/3ML nebulizer solution 3 mL (3 mLs Nebulization Given 02/06/18 2336)     Initial Impression / Assessment and Plan / ED Course  I have reviewed the triage vital signs and the nursing notes.  Pertinent labs & imaging results that were available during my care of the patient were reviewed by me and  considered in my medical decision making (see chart for details).     BP (!) 156/75   Pulse 95   Temp 98.4 F (36.9 C) (Oral)   Resp (!) 24   Ht 5\' 10"  (1.778 m)   Wt 81.6 kg (180 lb)  LMP 11/08/1978   SpO2 100%   BMI 25.83 kg/m    Final Clinical Impressions(s) / ED Diagnoses   Final diagnoses:  Mild intermittent reactive airway disease without complication    ED Discharge Orders    None     10:57 PM Patient here with chest tightness and shortness of breath while she was cleaning up the dust powder that the used to get beautifully.  I suspect her symptoms likely in reactive airway disease and less likely to be ACS however given her age and medical history, work-up initiated.  She is currently in no acute respiratory discomfort.  I have low suspicion for PE causing her symptoms.  3:20 AM Labs are reassuring, normal WBC, normal hemoglobin, electrolytes panels are normal, normal delta troponin, chest x-ray without acute active pulmonary disease.  After been monitoring for the past several hours, patient felt better and stable for discharge.  Return precautions discussed.  Care discussed with DR. Darl Householder.   Domenic Moras, PA-C 02/07/18 0327    Drenda Freeze, MD 02/08/18 [phone]

## 2018-02-07 ENCOUNTER — Other Ambulatory Visit: Payer: Self-pay

## 2018-02-07 DIAGNOSIS — J4520 Mild intermittent asthma, uncomplicated: Secondary | ICD-10-CM | POA: Diagnosis not present

## 2018-02-07 LAB — BASIC METABOLIC PANEL
ANION GAP: 8 (ref 5–15)
BUN: 15 mg/dL (ref 8–23)
CHLORIDE: 105 mmol/L (ref 98–111)
CO2: 30 mmol/L (ref 22–32)
Calcium: 9.5 mg/dL (ref 8.9–10.3)
Creatinine, Ser: 0.31 mg/dL — ABNORMAL LOW (ref 0.44–1.00)
GFR calc non Af Amer: >60 mL/min (ref >60)
Glucose, Bld: 86 mg/dL (ref 70–99)
Potassium: 3.6 mmol/L (ref 3.5–5.1)
SODIUM: 143 mmol/L (ref 135–145)

## 2018-02-07 LAB — CBC
HEMATOCRIT: 38.2 % (ref 36.0–46.0)
HEMOGLOBIN: 12.9 g/dL (ref 12.0–15.0)
MCH: 33.5 pg (ref 26.0–34.0)
MCHC: 33.8 g/dL (ref 30.0–36.0)
MCV: 99.2 fL (ref 78.0–100.0)
Platelets: 239 10*3/uL (ref 150–400)
RBC: 3.85 MIL/uL — ABNORMAL LOW (ref 3.87–5.11)
RDW: 14.1 % (ref 11.5–15.5)
WBC: 6.5 10*3/uL (ref 4.0–10.5)

## 2018-02-07 LAB — I-STAT TROPONIN, ED
Troponin i, poc: 0 ng/mL (ref 0.00–0.08)
Troponin i, poc: 0 ng/mL (ref 0.00–0.08)

## 2018-02-07 NOTE — Discharge Instructions (Addendum)
Your symptoms are likely due to inhalation of dust powder.  Avoid exposure to the dust powder.  Follow up with your doctor for further care.

## 2018-02-07 NOTE — ED Notes (Signed)
Next troponin to be drawn at 0315.

## 2018-06-25 ENCOUNTER — Other Ambulatory Visit: Payer: Self-pay

## 2018-06-25 ENCOUNTER — Emergency Department (HOSPITAL_COMMUNITY)
Admission: EM | Admit: 2018-06-25 | Discharge: 2018-06-25 | Disposition: A | Discharge status: Home / Self Care | Payer: Medicare Other | Attending: Emergency Medicine | Admitting: Emergency Medicine

## 2018-06-25 ENCOUNTER — Encounter (HOSPITAL_COMMUNITY): Payer: Self-pay

## 2018-06-25 ENCOUNTER — Emergency Department (HOSPITAL_COMMUNITY): Payer: Medicare Other

## 2018-06-25 DIAGNOSIS — F329 Major depressive disorder, single episode, unspecified: Secondary | ICD-10-CM | POA: Insufficient documentation

## 2018-06-25 DIAGNOSIS — S8002XA Contusion of left knee, initial encounter: Secondary | ICD-10-CM | POA: Insufficient documentation

## 2018-06-25 DIAGNOSIS — Y998 Other external cause status: Secondary | ICD-10-CM | POA: Diagnosis not present

## 2018-06-25 DIAGNOSIS — Z79899 Other long term (current) drug therapy: Secondary | ICD-10-CM | POA: Diagnosis not present

## 2018-06-25 DIAGNOSIS — Y92002 Bathroom of unspecified non-institutional (private) residence single-family (private) house as the place of occurrence of the external cause: Secondary | ICD-10-CM | POA: Diagnosis not present

## 2018-06-25 DIAGNOSIS — I10 Essential (primary) hypertension: Secondary | ICD-10-CM | POA: Insufficient documentation

## 2018-06-25 DIAGNOSIS — Z9049 Acquired absence of other specified parts of digestive tract: Secondary | ICD-10-CM | POA: Diagnosis not present

## 2018-06-25 DIAGNOSIS — Z91040 Latex allergy status: Secondary | ICD-10-CM | POA: Diagnosis not present

## 2018-06-25 DIAGNOSIS — Y9389 Activity, other specified: Secondary | ICD-10-CM | POA: Diagnosis not present

## 2018-06-25 DIAGNOSIS — Z87891 Personal history of nicotine dependence: Secondary | ICD-10-CM | POA: Diagnosis not present

## 2018-06-25 DIAGNOSIS — S9032XA Contusion of left foot, initial encounter: Secondary | ICD-10-CM | POA: Insufficient documentation

## 2018-06-25 DIAGNOSIS — S8992XA Unspecified injury of left lower leg, initial encounter: Secondary | ICD-10-CM | POA: Diagnosis present

## 2018-06-25 DIAGNOSIS — W01198A Fall on same level from slipping, tripping and stumbling with subsequent striking against other object, initial encounter: Secondary | ICD-10-CM | POA: Insufficient documentation

## 2018-06-25 MED ORDER — TRAMADOL HCL 50 MG PO TABS: 25.0000 mg | ORAL_TABLET | Freq: Four times a day (QID) | ORAL | 0 refills | Status: AC | PRN

## 2018-06-25 MED ORDER — ACETAMINOPHEN 325 MG PO TABS
650.0000 mg | ORAL_TABLET | Freq: Once | ORAL | Status: AC
  Administered 2018-06-25: 650 mg via ORAL
  Filled 2018-06-25: qty 2

## 2018-06-25 MED ORDER — IBUPROFEN 200 MG PO TABS
400.0000 mg | ORAL_TABLET | Freq: Once | ORAL | Status: AC
  Administered 2018-06-25: 400 mg via ORAL
  Filled 2018-06-25: qty 2

## 2018-06-25 NOTE — ED Provider Notes (Addendum)
[facility] [place] DEPT Provider Note   CSN: [id number] Arrival date & time: 06/25/18  0257     History   Chief Complaint No chief complaint on file.   HPI Heather [name] is a 77 y.o. female.  Patient presents to the emergency department for evaluation of left leg pain after a fall.  Patient reports that she stood up from the toilet and her left leg gave out.  She has chronic problems with her left knee and leg.  This caused her to fall.  She reports that she fell backwards, hit her back on the cabinet.  She did not hit her head, no loss of consciousness.  She denies neck and back pain.  She is brought to the ER from home by ambulance because she has left foot (mostly toe) pain and left knee pain.  She does not have any pain in the hips.  No injury to other extremities.  There was no loss of consciousness, dizziness, chest pain, palpitations, shortness of breath.     Past Medical History:  Diagnosis Date  . Back pain   . Chronic venous insufficiency    Present for years. Norvasc D/C'd 2012 thinking it might be contributing.  . Depression    Controlled with SSRI  . 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 GERD (gastroesophageal reflux disease)    Chronic PPI's  . Glaucoma   . Gout    No crystal dx. R podegra x 3 episodes around 2010. Did not take the allopurinol that was rec 2011.  Marland Kitchen History of PSVT (paroxysmal supraventricular tachycardia)    Remote and self limited.  Marland Kitchen HTN (hypertension)   . LGSIL (low grade squamous intraepithelial lesion) on Pap smear 02/06/07   HPV Gyn 8/08 negative  . Migraines   . Muscle weakness   . NASH (nonalcoholic steatohepatitis)    Per [place] 10/10  . Neuropathy   . Polymyositis ([place])   . S/P dilatation of esophageal stricture    7/12 Dr Carlean Purl. Dilation of tight stricture proximal eso.  Prior dilation 09 and 08.  . Seasonal allergies    Zyrtec D PRN    Patient Active  Problem List   Diagnosis Date Noted  . Headache 09/22/2014  . Anemia of other chronic disease 04/09/2014  . Weakness 03/23/2014  . Pre-syncope 03/23/2014  . Orthostasis 03/23/2014  . Upper airway cough syndrome 10/20/2013  . 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 UTI (lower urinary tract infection) 04/28/2013  . Dermatitis 03/10/2013  . 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 Chronic sore throat 08/14/2012  . Chronic cough 08/14/2012  . 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 Swollen lymph nodes 05/28/2012  . Other dysphagia 02/13/2011  . Routine health maintenance 11/08/2010  . History of PSVT (paroxysmal supraventricular tachycardia) 09/08/2010  . NASH (nonalcoholic steatohepatitis)   . Gout   . LGSIL (low grade squamous intraepithelial lesion) on Pap smear   . ESOPHAGEAL STENOSIS 06/25/2008  . Major depression in remission ([place]) 11/16/2006  . GLAUCOMA NOS 11/16/2006  . Essential hypertension 11/16/2006  . ALLERGIC RHINITIS, SEASONAL 11/16/2006  . GERD 11/16/2006    Past Surgical History:  Procedure Laterality Date  . ABDOMINAL HYSTERECTOMY  1980   2/2 menorrhagia  . BLADDER REPAIR    . CHOLECYSTECTOMY    . COLONOSCOPY  09/28/2006   diverticulosis  . ESOPHAGOGASTRODUODENOSCOPY  09/28/2006; 07/17/2008   dilation of proximal stenosis/cricopharyngeal achalasais both times,  occult ring, ? dyspmotility of esophagus  . ESOPHAGOGASTRODUODENOSCOPY N/A 05/06/2013   Procedure: ESOPHAGOGASTRODUODENOSCOPY (EGD) Probable Venia Minks.;  Surgeon: Jerene Bears, MD;  Location: [facility];  Service: Gastroenterology;  Laterality: N/A;  . EYE SURGERY     cataracts, glaucoma  . KNEE ARTHROSCOPY    . LAPAROSCOPIC BURCH PROCEDURE  04/02/07   Burch colposuspension. Inadvertant bladder damage - bladder repair, left ureteral stent placement, .  . MUSCLE BIOPSY Right 11/18/2012   Procedure: right vastus lateralis muscle biopsy ;   Surgeon: Haywood Lasso, MD;  Location: [facility];  Service: General;  Laterality: Right;  . TUBAL LIGATION       OB History   None      Home Medications    Prior to Admission medications   Medication Sig Start Date End Date Taking? Authorizing Provider  amitriptyline (ELAVIL) 50 MG tablet Take 1 tablet (50 mg total) by mouth at bedtime. 11/25/14   Jessee Avers, MD  amLODipine (NORVASC) 5 MG tablet Take 1 tablet (5 mg total) by mouth daily. 11/07/14   [name], MD  clonazePAM (KLONOPIN) 1 MG tablet take ONE tab BY MOUTH TWICE DAILY 01/12/17   [provider]  cloNIDine (CATAPRES) 0.1 MG tablet Take 1 tablet (0.1 mg total) by mouth 2 (two) times daily. 11/07/14   [name], MD  docusate sodium (COLACE) 100 MG capsule Take 1 capsule (100 mg total) by mouth every 12 (twelve) hours. 01/01/17   Charlesetta Shanks, MD  ENSURE PLUS (ENSURE PLUS) LIQD Take 237 mLs by mouth 3 (three) times daily between meals.  02/21/16   [provider]  FLUoxetine (PROZAC) 40 MG capsule Take 40 mg by mouth daily.     [provider]  latanoprost (XALATAN) 0.005 % ophthalmic solution Place 1 drop into the left eye at bedtime.  01/19/14   [provider]  metoprolol (LOPRESSOR) 100 MG tablet Take 1 tablet (100 mg total) by mouth 2 (two) times daily. 11/07/14   [name], MD  Multiple Vitamin (MULTIVITAMIN) capsule Take 1 capsule by mouth daily. 05/04/14   [name], MD  traMADol (ULTRAM) 50 MG tablet Take 0.5-1 tablets (25-50 mg total) by mouth every 6 (six) hours as needed. 06/25/18   Orpah Greek, MD  zolpidem (AMBIEN) 5 MG tablet TAKE ONE TABLET BY MOUTH AT BEDTIME AS NEEDED. 07/19/15   Rice, Resa Miner, MD    Family History Family History  Problem Relation Age of Onset  . Heart disease Mother   . Hypertension Mother   . Heart attack Mother   . Heart disease Father   . Hypertension Father   . Stroke Father   . Heart disease Sister   . Hypertension  Sister   . Throat cancer Brother   . Hypertension Brother   . Heart disease Brother   . Colon cancer Neg Hx     Social History Social History   Tobacco Use  . Smoking status: Former Smoker    Types: Cigarettes    Last attempt to quit: 08/07/1968    Years since quitting: 49.9  . Smokeless tobacco: Never Used  Substance Use Topics  . Alcohol use: No  . Drug use: No     Allergies   Propoxyphene napsylate; Darvon; Propoxyphene; and Latex   Review of Systems Review of Systems  Musculoskeletal: Positive for arthralgias.  All other systems reviewed and are negative.    Physical Exam Updated Vital Signs BP 130/65 (BP Location: Right Arm)  Pulse 64   Resp 20   LMP 11/08/1978   SpO2 99%   Physical Exam  Constitutional: She is oriented to person, place, and time. She appears well-developed and well-nourished. No distress.  HENT:  Head: Normocephalic and atraumatic.  Right Ear: Hearing normal.  Left Ear: Hearing normal.  Nose: Nose normal.  Mouth/Throat: Oropharynx is clear and moist and mucous membranes are normal.  Eyes: Pupils are equal, round, and reactive to light. Conjunctivae and EOM are normal.  Neck: Normal range of motion. Neck supple.  Cardiovascular: Regular rhythm, S1 normal and S2 normal. Exam reveals no gallop and no friction rub.  No murmur heard. Pulmonary/Chest: Effort normal and breath sounds normal. No respiratory distress. She exhibits no tenderness.  Abdominal: Soft. Normal appearance and bowel sounds are normal. There is no hepatosplenomegaly. There is no tenderness. There is no rebound, no guarding, no tenderness at McBurney's point and negative Murphy's sign. No hernia.  Musculoskeletal: Normal range of motion.       Left knee: She exhibits no swelling, no effusion, no ecchymosis and no deformity. Tenderness found.       Left ankle: Normal.       Cervical back: Normal.       Thoracic back: Normal.       Lumbar back: Normal.       Left foot:  There is tenderness. There is no swelling and no deformity.  Neurological: She is alert and oriented to person, place, and time. She has normal strength. No cranial nerve deficit or sensory deficit. Coordination normal. GCS eye subscore is 4. GCS verbal subscore is 5. GCS motor subscore is 6.  Skin: Skin is warm, dry and intact. No rash noted. No cyanosis.  Psychiatric: She has a normal mood and affect. Her speech is normal and behavior is normal. Thought content normal.  Nursing note and vitals reviewed.    ED Treatments / Results  Labs (all labs ordered are listed, but only abnormal results are displayed) Labs Reviewed - No data to display  EKG None  Radiology Dg Knee Complete 4 Views Left  Result Date: 06/25/2018 CLINICAL DATA:  Posterior and lateral knee pain after a fall. EXAM: LEFT KNEE - COMPLETE 4+ VIEW COMPARISON:  None 09/26/2010 FINDINGS: Diffuse bone demineralization. Degenerative changes with medial compartment narrowing and small osteophyte formation. Cortical irregularity along the base of the tibial tubercle is new since prior study and may indicate avulsion injury. No displaced fractures are identified. No significant effusion. Soft tissues are unremarkable. IMPRESSION: Degenerative changes in the left knee. Cortical irregularity along the base of the tibial tubercle may indicate avulsion injury. Electronically Signed   By: Lucienne Capers M.D.   On: 06/25/2018 04:06   Dg Foot Complete Left  Result Date: 06/25/2018 CLINICAL DATA:  Pain at the base of the great toe after a fall. EXAM: LEFT FOOT - COMPLETE 3+ VIEW COMPARISON:  04/29/2011 FINDINGS: Diffuse bone demineralization. Degenerative changes in the interphalangeal, first metatarsal-phalangeal, and intertarsal joints. No evidence of acute fracture or dislocation. No focal bone lesion or bone destruction. Dorsal soft tissue swelling over the forefoot. IMPRESSION: Diffuse bone demineralization and degenerative changes.  Soft tissue swelling. No acute bony abnormalities. Electronically Signed   By: Lucienne Capers M.D.   On: 06/25/2018 04:03    Procedures Procedures (including critical care time)  Medications Ordered in ED Medications  acetaminophen (TYLENOL) tablet 650 mg (has no administration in time range)  ibuprofen (ADVIL,MOTRIN) tablet 400 mg (has no administration in  time range)     Initial Impression / Assessment and Plan / ED Course  I have reviewed the triage vital signs and the nursing notes.  Pertinent labs & imaging results that were available during my care of the patient were reviewed by me and considered in my medical decision making (see chart for details).     Presents to the emergency department for evaluation of isolated foot and knee injury after a fall.  Patient has chronic problems with her legs and knees.  She does not walk, uses a wheelchair exclusively.  No evidence of head injury, neck or back injury.  Hips are normal, normal range of motion, no tenderness or deformity.  She underwent x-ray of foot and knee.  She has arthritic changes in both but no acute injury.  Addendum: Attempting to take the pain medications, patient choked.  Airway was cleared and she will be monitored for a period of time to ensure that she continues to breathe well.  Currently oxygen saturations are normal, breathing without difficulty.  Final Clinical Impressions(s) / ED Diagnoses   Final diagnoses:  Contusion of left foot, initial encounter  Contusion of left knee, initial encounter    ED Discharge Orders         Ordered    traMADol (ULTRAM) 50 MG tablet  Every 6 hours PRN     06/25/18 0610           Orpah Greek, MD 06/25/18 0677    Orpah Greek, MD 06/25/18 [phone]

## 2018-06-25 NOTE — ED Notes (Signed)
Case management is at bedside.

## 2018-06-25 NOTE — ED Notes (Signed)
Per pt's roommate that accompanies pt, pills need to be administered crushed.  Notified patient and friend that this RN would return shortly with pill crusher to administer crushed pills.

## 2018-06-25 NOTE — Care Management Note (Addendum)
Case Management Note  Patient Details  Name: Heather [name] MRN: [id number] Date of Birth: 10/24/1940  CM requested to speak with [name] again at bedside.  CM requested that CSW attend the conversation.  CM spoke with [name] at length and was advised during this conversation that pt normally can ambulate approx 10 ft or so but is now unable to do so.  This information was provided during the initial conversation.  At this point, CM woke pt up to make sure pt was involved in the discussion this time.  [name] advised he had called a personal contact at [place] and was requesting the EDP provide documentation of pt's needs.  CM explained several times the ED does not know what is specifically needed for Medicaid guidelines and, again, that her PCP would be the one to assist with the [facility] PCS paperwork.  He asked what the doctors do in the ED and CM explained their purpose.  CM then offered [facility] based on pt's acute change in ambulation due to pain and weakness.  CM reiterated again what HH did and how often they would come to the home.  CM and CSW again explained who pays for what and the entirety of the process with PCS vs. HH vs. Short term rehab vs. LTC ALF/SNF.  [name] continued to insist that insurance will pay for these services.  Pt's son [name] arrived and the conversation was had again.  Pt, [name] and [name] appeared aware at the end of the conversation to again understand that the process will not be quick and if the pt needs additional assistance it will be from them or paid for out-of-pocket by them until Medicaid is reinstated. They were agreeable to going home with [facility] and plan to follow up at [place] and Dr. Leontine Locket office today. Pt reported she has used [facility] before and would like to use them again.  CM updated [name], RN and Dr. Ashok Cordia who placed [facility] orders.  CM contacted [name] with [facility] who accepted pt for services and is aware of the situation.  No further CM needs noted at this  time.  Expected Discharge Date:   06/25/2018               Expected Discharge Plan:  [place]  In-House Referral:  Clinical Social Work  Discharge planning Services  CM Consult  Post Acute Care Choice:  Home Health Choice offered to:  Adult Children, Patient(SO)  HH Arranged:  PT, OT, Nurse's Aide, Social Work [employer] Agency:  [facility]  Status of Service:  Completed, signed off  [name], Benjaman Lobe, RN 06/25/2018, 11:47 AM

## 2018-06-25 NOTE — ED Notes (Signed)
Pt awaiting [facility] for transport. Pts family remains at bedside.

## 2018-06-25 NOTE — ED Notes (Signed)
Assisted patient to use bed pain in room.  Urine yellow, clear.  Pt requires assistance to turn but is able to assist herself.

## 2018-06-25 NOTE — ED Notes (Addendum)
Patient was able to swallow 3 oz of water without interruption, difficulty, or complaints.

## 2018-06-25 NOTE — ED Notes (Signed)
Bed: WA15 Expected date:  Expected time:  Means of arrival:  Comments: EMS-[facility] 

## 2018-06-25 NOTE — Care Management Note (Addendum)
Case Management Note  CM consulted for transition of care planning.  CM spoke with pt and SO, [name], at bedside with son, [name], on speaker phone at length about pt's needs being greater than what HH is able to provide.  Discussed who pays for what with PCS, ALF, and SNF.  Discussed Medicaid, Medicare, and private insurance differences.  Discussed reinstating Medicaid and confirmed with admission associate that Medicaid is not an active coverage at this time.  Discussed separate application process for Medicaid PCS.  Discussed no 3 night intp qualifying stay for short term rehab.  Pt did not participate in the conversation as she was not alert during this time.  [name] and [name] acknowledged understanding and had plans to look at the [facility] web site and go to [facility] today after they left the ED.  [name] requested assistance with dressing the pt and the paperwork to leave.  Updated [name], RN that pt was ready to leave.  No further CM needs noted at this time.  Abdulah Iqbal, Benjaman Lobe, RN 06/25/2018, 9:48 AM

## 2018-06-25 NOTE — ED Triage Notes (Signed)
Patient arrives by [facility] after a fall to her knees tonight and complaining of left leg pain. Patient is coming from home. Patient states she was getting up from the toliet and her knees buckled.

## 2018-06-25 NOTE — ED Notes (Addendum)
Ibuprofen and Tylenol crushed as instructed by patient's roommate. Pt's crushed pills put in apple sauce. Pt states she normally takes pills with yogurt and drinks water. Patient asked if applesauce is okay since yogurt is unavailable.  Patient nods.  Patient tolerated first 2 bites of apple sauce without difficulty, swallowing appropriately.  On 3rd bite of applesauce patient held applesauce in her mouth, attempted to swallow, and then appeared unable to swallow.  Pt gestured to her throat and still did not swallow.  Patient immediately moved in her hallway stretcher into a room with suction.  Suctioned approximately 25 cc of applesauce from pt's mouth with yankauer.  Patient began to cough.  Assisted patient with suctioning sputum as she coughed.  Pt's spO2 remain consistent with baseline throughout stay at <97%.  Once coughing subsided, patient requesting water and drank water without difficulty.  When asked, patient said she has had this problem before, which is why she usually eats yogurt.  SpO2 monitoring in place and pt will stay in this room.  MD presented to beside to reassess pt and states she will be monitored for an additional period of time prior to dispo.

## 2018-06-25 NOTE — ED Notes (Signed)
[facility] has been contacted for patient transport from ED to home.

## 2018-06-25 NOTE — ED Notes (Signed)
Bed: [facility] Expected date:  Expected time:  Means of arrival:  Comments: Hold for 15

## 2018-08-09 ENCOUNTER — Emergency Department (HOSPITAL_COMMUNITY)
Admission: EM | Admit: 2018-08-09 | Discharge: 2018-08-10 | Disposition: A | Discharge status: Home / Self Care | Payer: Medicare Other | Attending: Emergency Medicine | Admitting: Emergency Medicine

## 2018-08-09 ENCOUNTER — Other Ambulatory Visit: Payer: Self-pay

## 2018-08-09 ENCOUNTER — Encounter (HOSPITAL_COMMUNITY): Payer: Self-pay

## 2018-08-09 DIAGNOSIS — K5900 Constipation, unspecified: Secondary | ICD-10-CM

## 2018-08-09 DIAGNOSIS — Z87891 Personal history of nicotine dependence: Secondary | ICD-10-CM | POA: Insufficient documentation

## 2018-08-09 DIAGNOSIS — I10 Essential (primary) hypertension: Secondary | ICD-10-CM | POA: Insufficient documentation

## 2018-08-09 DIAGNOSIS — R1310 Dysphagia, unspecified: Secondary | ICD-10-CM | POA: Insufficient documentation

## 2018-08-09 DIAGNOSIS — Z91040 Latex allergy status: Secondary | ICD-10-CM | POA: Insufficient documentation

## 2018-08-09 DIAGNOSIS — R531 Weakness: Secondary | ICD-10-CM | POA: Diagnosis present

## 2018-08-09 DIAGNOSIS — R911 Solitary pulmonary nodule: Secondary | ICD-10-CM

## 2018-08-09 DIAGNOSIS — Z79899 Other long term (current) drug therapy: Secondary | ICD-10-CM | POA: Insufficient documentation

## 2018-08-09 LAB — BASIC METABOLIC PANEL
Anion gap: 11 (ref 5–15)
BUN: 12 mg/dL (ref 8–23)
CO2: 27 mmol/L (ref 22–32)
Calcium: 9.1 mg/dL (ref 8.9–10.3)
Chloride: 100 mmol/L (ref 98–111)
Creatinine, Ser: 0.64 mg/dL (ref 0.44–1.00)
GFR calc Af Amer: >60 mL/min (ref >60)
GFR calc non Af Amer: >60 mL/min (ref >60)
Glucose, Bld: 83 mg/dL (ref 70–99)
Potassium: 4.9 mmol/L (ref 3.5–5.1)
Sodium: 138 mmol/L (ref 135–145)

## 2018-08-09 NOTE — ED Notes (Signed)
Lab reports that a CBC needs to be redrawn.

## 2018-08-09 NOTE — ED Triage Notes (Addendum)
Patient states she fell to the floor today while trying to get off of the toilet. Patient states it has been 10 days since she has had a normal BM. Patient states she has been taking Miralax. Patient states she went to an UC for nausea and an [place] was completed.

## 2018-08-10 ENCOUNTER — Emergency Department (HOSPITAL_COMMUNITY): Payer: Medicare Other

## 2018-08-10 ENCOUNTER — Encounter (HOSPITAL_COMMUNITY): Payer: Self-pay

## 2018-08-10 DIAGNOSIS — K5900 Constipation, unspecified: Secondary | ICD-10-CM | POA: Diagnosis not present

## 2018-08-10 LAB — URINALYSIS, MICROSCOPIC (REFLEX)
Bacteria, UA: NONE SEEN
Squamous Epithelial / HPF: NONE SEEN (ref 0–5)

## 2018-08-10 LAB — URINALYSIS, ROUTINE W REFLEX MICROSCOPIC
Bilirubin Urine: NEGATIVE
Glucose, UA: NEGATIVE mg/dL
Hgb urine dipstick: NEGATIVE
Nitrite: NEGATIVE
Protein, ur: NEGATIVE mg/dL
Specific Gravity, Urine: <1.005 — ABNORMAL LOW (ref 1.005–1.030)
pH: 7 (ref 5.0–8.0)

## 2018-08-10 LAB — CBC WITH DIFFERENTIAL/PLATELET
Abs Immature Granulocytes: 0.01 10*3/uL (ref 0.00–0.07)
Basophils Absolute: 0 10*3/uL (ref 0.0–0.1)
Basophils Relative: 1 %
Eosinophils Absolute: 0 10*3/uL (ref 0.0–0.5)
Eosinophils Relative: 0 %
HCT: 37.9 % (ref 36.0–46.0)
Hemoglobin: 12.4 g/dL (ref 12.0–15.0)
Immature Granulocytes: 0 %
LYMPHS ABS: 2.1 10*3/uL (ref 0.7–4.0)
Lymphocytes Relative: 45 %
MCH: 32.7 pg (ref 26.0–34.0)
MCHC: 32.7 g/dL (ref 30.0–36.0)
MCV: 100 fL (ref 80.0–100.0)
Monocytes Absolute: 0.3 10*3/uL (ref 0.1–1.0)
Monocytes Relative: 7 %
NEUTROS PCT: 47 %
Neutro Abs: 2.3 10*3/uL (ref 1.7–7.7)
PLATELETS: 244 10*3/uL (ref 150–400)
RBC: 3.79 MIL/uL — ABNORMAL LOW (ref 3.87–5.11)
RDW: 14.1 % (ref 11.5–15.5)
WBC: 4.8 10*3/uL (ref 4.0–10.5)
nRBC: 0 % (ref 0.0–0.2)

## 2018-08-10 LAB — CBG MONITORING, ED: Glucose-Capillary: 78 mg/dL (ref 70–99)

## 2018-08-10 MED ORDER — SODIUM CHLORIDE (PF) 0.9 % IJ SOLN
INTRAMUSCULAR | Status: AC
  Filled 2018-08-10: qty 50

## 2018-08-10 MED ORDER — IOPAMIDOL (ISOVUE-300) INJECTION 61%
100.0000 mL | Freq: Once | INTRAVENOUS | Status: AC | PRN
  Administered 2018-08-10: 100 mL via INTRAVENOUS

## 2018-08-10 MED ORDER — IOPAMIDOL (ISOVUE-300) INJECTION 61%
INTRAVENOUS | Status: AC
  Filled 2018-08-10: qty 100

## 2018-08-10 MED ORDER — GLYCERIN (LAXATIVE) 2.1 G RE SUPP
1.0000 | Freq: Once | RECTAL | Status: AC
  Administered 2018-08-10: 1 via RECTAL
  Filled 2018-08-10: qty 1

## 2018-08-10 MED ORDER — POLYETHYLENE GLYCOL 3350 17 G PO PACK
34.0000 g | PACK | Freq: Once | ORAL | Status: AC
  Administered 2018-08-10: 34 g via ORAL
  Filled 2018-08-10: qty 2

## 2018-08-10 MED ORDER — SENNOSIDES-DOCUSATE SODIUM 8.6-50 MG PO TABS: 1.0000 | ORAL_TABLET | Freq: Two times a day (BID) | ORAL | 0 refills | Status: AC | PRN

## 2018-08-10 NOTE — ED Provider Notes (Signed)
Patient was seen by Dr. Wilson Singer.  She did not meet criteria for inpatient admission.  She has been seen by case management who has arranged for home health services.  They will also help with possible assisted living/nursing facility placement.  She was seen by her PCP Dr. Jonelle Sidle briefly in the ED.  She was given an enema with relief of her constipation.  She was discharged with instructions per Dr. Wilson Singer.   Malvin Johns, MD 08/10/18 1344

## 2018-08-10 NOTE — ED Notes (Signed)
Patient transported from CT to room14

## 2018-08-10 NOTE — ED Notes (Signed)
1000 ml warm soap suds enema given. She is then assisted up to b.s.c.

## 2018-08-10 NOTE — ED Notes (Signed)
She remains in no distress. Still has not had a b.m. as of yet. She is happily drinking her Miralax, which I mixed with water.

## 2018-08-10 NOTE — ED Notes (Signed)
Patient transported to CT 

## 2018-08-10 NOTE — ED Notes (Signed)
After three unsuccessful attempts at IV start IV team consult obtained

## 2018-08-10 NOTE — ED Notes (Signed)
She is resting comfortably in the presence of her husband. No results from the suppository yet.

## 2018-08-10 NOTE — ED Notes (Signed)
She has passed two formed stools since enema/supp. Given. She is in no distress. She thanks [place] for our care.

## 2018-08-10 NOTE — ED Notes (Signed)
I get her a drink per her request. Also, our secretary has re-paged Social work, who spoke with Dr. Tamera Punt.

## 2018-08-10 NOTE — Care Management Note (Addendum)
Case Management Note  Patient Details  Name: Heather [name] MRN: [id number] Date of Birth: January 08, 1941  Subjective/Objective:   falls                 Action/Plan: NCM spoke to pt at bedside. States she lives in home with a roommate. Her son will assist but it is limited. He does provide transportation to her appts. Has motorized scooter and hospital bed at home. Offered choice for HH/CMS list provided/placed on chart. Pt had [facility] in the past. Contacted [facility] rep, Myriam Jacobson with new referral. Pt is interested in ALF or LT SNF. Explained [facility] SW will assist with arrange LT placement to ALF or SNF LTC.   Expected Discharge Date:  08/10/2017            Expected Discharge Plan:  [place]  In-House Referral:  NA  Discharge planning Services  CM Consult  Post Acute Care Choice:  Home Health Choice offered to:  Patient  DME Arranged:  N/A DME Agency:  NA  HH Arranged:  RN, PT, Social Work, Nurse's Aide, Speech Therapy HH Agency:  [facility]  Status of Service:  Completed, signed off  If discussed at [employer], dates discussed:    Additional Comments:  Erenest Rasher, RN 08/10/2018, 11:39 AM

## 2018-08-10 NOTE — Progress Notes (Signed)
CSW aware of consult- spoke with [facility], EDP, who stated patient is needing assistance at home with [facility]. CSW asked EDP to place case management consult.   Kingsley Spittle, LCSW Clinical Social Worker  [facility]  [phone]

## 2018-08-10 NOTE — ED Provider Notes (Signed)
[facility] DEPT Provider Note   CSN: [id number] Arrival date & time: 08/09/18  1806     History   Chief Complaint Chief Complaint  Patient presents with  . Fall  . Weakness  . Constipation    HPI Heather [name] is a 78 y.o. female.  HPI   77yF with generalized weakness. Hx of polymyositis and chronically weak, but more so in the past few days. Falling. [place] today when transferring around toilet. Denies acute injuries from this fall. Feels hungry but having a hard time eating from dysphagia. Family reports she doesn't get much more than a Boost in daily. Last BM ~10d ago. Has tried miralax without result.   Past Medical History:  Diagnosis Date  . Back pain   . Chronic venous insufficiency    Present for years. Norvasc D/C'd 2012 thinking it might be contributing.  . Depression    Controlled with SSRI  . 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 GERD (gastroesophageal reflux disease)    Chronic PPI's  . Glaucoma   . Gout    No crystal dx. R podegra x 3 episodes around 2010. Did not take the allopurinol that was rec 2011.  Marland Kitchen History of PSVT (paroxysmal supraventricular tachycardia)    Remote and self limited.  Marland Kitchen HTN (hypertension)   . LGSIL (low grade squamous intraepithelial lesion) on Pap smear 02/06/07   HPV Gyn 8/08 negative  . Migraines   . Muscle weakness   . NASH (nonalcoholic steatohepatitis)    Per [place] 10/10  . Neuropathy   . Polymyositis ([place])   . S/P dilatation of esophageal stricture    7/12 Dr Carlean Purl. Dilation of tight stricture proximal eso.  Prior dilation 09 and 08.  . Seasonal allergies    Zyrtec D PRN    Patient Active Problem List   Diagnosis Date Noted  . Headache 09/22/2014  . Anemia of other chronic disease 04/09/2014  . Weakness 03/23/2014  . Pre-syncope 03/23/2014  . Orthostasis 03/23/2014  . Upper airway cough syndrome 10/20/2013  . Dysphagia,  pharyngoesophageal phase- due to inclusion body myositis 05/19/2013  . UTI (lower urinary tract infection) 04/28/2013  . Dermatitis 03/10/2013  . 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 Chronic sore throat 08/14/2012  . Chronic cough 08/14/2012  . 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 Swollen lymph nodes 05/28/2012  . Other dysphagia 02/13/2011  . Routine health maintenance 11/08/2010  . History of PSVT (paroxysmal supraventricular tachycardia) 09/08/2010  . NASH (nonalcoholic steatohepatitis)   . Gout   . LGSIL (low grade squamous intraepithelial lesion) on Pap smear   . ESOPHAGEAL STENOSIS 06/25/2008  . Major depression in remission ([place]) 11/16/2006  . GLAUCOMA NOS 11/16/2006  . Essential hypertension 11/16/2006  . ALLERGIC RHINITIS, SEASONAL 11/16/2006  . GERD 11/16/2006    Past Surgical History:  Procedure Laterality Date  . ABDOMINAL HYSTERECTOMY  1980   2/2 menorrhagia  . BLADDER REPAIR    . CHOLECYSTECTOMY    . COLONOSCOPY  09/28/2006   diverticulosis  . ESOPHAGOGASTRODUODENOSCOPY  09/28/2006; 07/17/2008   dilation of proximal stenosis/cricopharyngeal achalasais both times, occult ring, ? dyspmotility of esophagus  . ESOPHAGOGASTRODUODENOSCOPY N/A 05/06/2013   Procedure: ESOPHAGOGASTRODUODENOSCOPY (EGD) Probable Venia Minks.;  Surgeon: Jerene Bears, MD;  Location: [facility];  Service: Gastroenterology;  Laterality: N/A;  . EYE SURGERY     cataracts, glaucoma  . KNEE ARTHROSCOPY    . LAPAROSCOPIC BURCH PROCEDURE  04/02/07  Burch colposuspension. Inadvertant bladder damage - bladder repair, left ureteral stent placement, .  . MUSCLE BIOPSY Right 11/18/2012   Procedure: right vastus lateralis muscle biopsy ;  Surgeon: Haywood Lasso, MD;  Location: [facility];  Service: General;  Laterality: Right;  . TUBAL LIGATION       OB History   No obstetric history on file.      Home Medications    Prior to Admission medications   Medication Sig Start  Date End Date Taking? Authorizing Provider  amitriptyline (ELAVIL) 50 MG tablet Take 1 tablet (50 mg total) by mouth at bedtime. 11/25/14   Jessee Avers, MD  amLODipine (NORVASC) 5 MG tablet Take 1 tablet (5 mg total) by mouth daily. 11/07/14   [name], MD  cetirizine (ZYRTEC) 10 MG tablet Take 10 mg by mouth daily. 05/24/18   [provider]  clonazePAM (KLONOPIN) 1 MG tablet take ONE tab BY MOUTH TWICE DAILY 01/12/17   [provider]  cloNIDine (CATAPRES) 0.1 MG tablet Take 1 tablet (0.1 mg total) by mouth 2 (two) times daily. 11/07/14   [name], MD  docusate sodium (COLACE) 100 MG capsule Take 1 capsule (100 mg total) by mouth every 12 (twelve) hours. 01/01/17   Charlesetta Shanks, MD  ENSURE PLUS (ENSURE PLUS) LIQD Take 237 mLs by mouth 3 (three) times daily between meals.  02/21/16   [provider]  FLUoxetine (PROZAC) 40 MG capsule Take 40 mg by mouth daily.     [provider]  HYDROcodone-acetaminophen (NORCO/VICODIN) 5-325 MG tablet Take 1 tablet by mouth every 6 (six) hours as needed for pain.    [provider]  latanoprost (XALATAN) 0.005 % ophthalmic solution Place 1 drop into the left eye at bedtime.  01/19/14   [provider]  metoprolol (LOPRESSOR) 100 MG tablet Take 1 tablet (100 mg total) by mouth 2 (two) times daily. 11/07/14   [name], MD  Multiple Vitamin (MULTIVITAMIN) capsule Take 1 capsule by mouth daily. 05/04/14   [name], MD  naproxen (NAPROSYN) 500 MG tablet Take 500 mg by mouth 2 (two) times daily. 05/24/18   [provider]  sulfamethoxazole-trimethoprim (BACTRIM DS,SEPTRA DS) 800-160 MG tablet Take 1 tablet by mouth 2 (two) times daily. 06/20/18   [provider]  traMADol (ULTRAM) 50 MG tablet Take 0.5-1 tablets (25-50 mg total) by mouth every 6 (six) hours as needed. 06/25/18   Orpah Greek, MD  zolpidem (AMBIEN) 5 MG tablet TAKE ONE TABLET BY MOUTH AT BEDTIME AS NEEDED.  07/19/15   Rice, Resa Miner, MD    Family History Family History  Problem Relation Age of Onset  . Heart disease Mother   . Hypertension Mother   . Heart attack Mother   . Heart disease Father   . Hypertension Father   . Stroke Father   . Heart disease Sister   . Hypertension Sister   . Throat cancer Brother   . Hypertension Brother   . Heart disease Brother   . Colon cancer Neg Hx     Social History Social History   Tobacco Use  . Smoking status: Former Smoker    Types: Cigarettes    Last attempt to quit: 08/07/1968    Years since quitting: 50.0  . Smokeless tobacco: Never Used  Substance Use Topics  . Alcohol use: No  . Drug use: No     Allergies   Propoxyphene napsylate; Darvon; Propoxyphene; and Latex   Review of Systems  Review of Systems  All systems reviewed and negative, other than as noted in HPI.  Physical Exam Updated Vital Signs BP (!) 145/78 (BP Location: Right Arm)   Pulse 65   Temp 98.2 F (36.8 C) (Oral)   Resp 16   Ht 5\' 10"  (1.778 m)   Wt 81.6 kg   LMP 11/08/1978   SpO2 98%   BMI 25.83 kg/m   Physical Exam Vitals signs and nursing note reviewed.  Constitutional:      General: She is not in acute distress.    Appearance: She is well-developed.  HENT:     Head: Normocephalic and atraumatic.  Eyes:     General:        Right eye: No discharge.        Left eye: No discharge.     Conjunctiva/sclera: Conjunctivae normal.  Neck:     Musculoskeletal: Neck supple.  Cardiovascular:     Rate and Rhythm: Normal rate and regular rhythm.     Heart sounds: Normal heart sounds. No murmur. No friction rub. No gallop.   Pulmonary:     Effort: Pulmonary effort is normal. No respiratory distress.     Breath sounds: Normal breath sounds.  Abdominal:     General: There is no distension.     Palpations: Abdomen is soft.     Tenderness: There is no abdominal tenderness.  Musculoskeletal:        General: No tenderness.  Skin:    General:  Skin is warm and dry.  Neurological:     Mental Status: She is alert.     Comments: Dysarthria. Global weakness.   Psychiatric:        Behavior: Behavior normal.        Thought Content: Thought content normal.      ED Treatments / Results  Labs (all labs ordered are listed, but only abnormal results are displayed) Labs Reviewed  URINALYSIS, ROUTINE W REFLEX MICROSCOPIC - Abnormal; Notable for the following components:      Result Value   Color, Urine STRAW (*)    Specific Gravity, Urine <1.005 (*)    Ketones, ur TRACE (*)    Leukocytes, UA SMALL (*)    All other components within normal limits  CBC WITH DIFFERENTIAL/PLATELET - Abnormal; Notable for the following components:   RBC 3.79 (*)    All other components within normal limits  BASIC METABOLIC PANEL  URINALYSIS, MICROSCOPIC (REFLEX)  CBG MONITORING, ED    EKG EKG Interpretation  Date/Time:  Saturday August 10 2018 00:27:40 EST Ventricular Rate:  67 PR Interval:    QRS Duration: 92 QT Interval:  388 QTC Calculation: 410 R Axis:   31 Text Interpretation:  Sinus rhythm Prolonged PR interval Borderline T abnormalities, inferior leads Confirmed by Virgel Manifold [id number]) on 08/10/2018 1:09:35 AM   Radiology Ct Abdomen Pelvis W Contrast  Result Date: 08/10/2018 CLINICAL DATA:  Initial evaluation for acute abdominal pain with distension. EXAM: CT ABDOMEN AND PELVIS WITH CONTRAST TECHNIQUE: Multidetector CT imaging of the abdomen and pelvis was performed using the standard protocol following bolus administration of intravenous contrast. CONTRAST:  154mL ISOVUE-300 IOPAMIDOL (ISOVUE-300) INJECTION 61% COMPARISON:  Prior ultrasound from 05/20/2009. FINDINGS: Lower chest: Trace left pleural effusion noted. Scattered atelectatic changes seen dependently within the visualized lung bases. 6 mm right lower lobe pulmonary nodule noted, indeterminate (series 6, image 5). Hepatobiliary: Liver demonstrates a normal contrast enhanced  appearance. Gallbladder surgically absent. Mild intrahepatic biliary dilatation likely related post  cholecystectomy changes. Common bile duct of normal caliber. Pancreas: Pancreas within normal limits. Spleen: Spleen within normal limits. Adrenals/Urinary Tract: 2.1 cm intermediate density nodule present at the medial limb of the right adrenal gland, indeterminate. Adrenal glands otherwise unremarkable. Kidneys equal in size with symmetric enhancement. 3.8 cm simple cyst present at the lower pole the left kidney. Few additional subcentimeter hypodensities involving the right kidney too small the characterize, but statistically likely reflects small cysts as well. No focal enhancing renal mass. No nephrolithiasis or hydronephrosis. No hydroureter. Bladder moderately distended 1.1 cm hyperdensity along the right posterolateral bladder wall suspected to be postsurgical in nature given provided history. Stomach/Bowel: Stomach largely decompressed without acute abnormality. No evidence for bowel obstruction. No abnormal wall thickening, mucosal enhancement, or inflammatory fat stranding seen about the bowels. Normal appendix. Moderate to large volume stool within the colon, which could reflect constipation. Vascular/Lymphatic: Normal intravascular enhancement seen throughout the intra-abdominal aorta. Mild atherosclerotic change for age. No aneurysm. Mesenteric vessels patent proximally. No adenopathy. Reproductive: Uterus appears to be absent. Ovaries within normal limits. No adnexal mass. Other: No free air or fluid. Musculoskeletal: No acute osseous abnormality. No discrete lytic or blastic osseous lesions. 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 IMPRESSION: 1. No CT evidence for acute intra-abdominal or pelvic process. 2. Moderate to large volume stool within the colon, which could reflect constipation. 3. Chronic bilateral pars defects at L5 with associated 3 mm  spondylolisthesis. 4. 6 mm right lower lobe pulmonary nodule, indeterminate. Non-contrast chest CT at 6-12 months is recommended. If the nodule is stable at time of repeat CT, then future CT at 18-24 months (from today's scan) is considered optional for low-risk patients, but is recommended for high-risk patients. This recommendation follows the consensus statement: Guidelines for Management of Incidental Pulmonary Nodules Detected on CT Images: From the Fleischner Society 2017; Radiology 2017; 284:228-243. Electronically Signed   By: Jeannine Boga M.D.   On: 08/10/2018 04:48    Procedures Procedures (including critical care time)  Medications Ordered in ED Medications - No data to display   Initial Impression / Assessment and Plan / ED Course  I have reviewed the triage vital signs and the nursing notes.  Pertinent labs & imaging results that were available during my care of the patient were reviewed by me and considered in my medical decision making (see chart for details).   77yF with acute on chronic weakness. Falling. Also increasing dysphagia. Not consuming much more than a Boost daily. Her nutritional status is going ot become an increasing concern and may need to consider gastrostomy tube. Currently though, her labs looks ok. No criteria for acute hospitalization. Will have social work see.   Final Clinical Impressions(s) / ED Diagnoses   Final diagnoses:  Constipation, unspecified constipation type  Dysphagia, unspecified type  Pulmonary nodule    ED Discharge Orders    None       Virgel Manifold, MD 08/20/18 1131

## 2018-08-10 NOTE — ED Notes (Signed)
Pt has purwick in place. Waiting for urine specimen.

## 2020-01-14 IMAGING — CT CT ABD-PELV W/ CM
2 of 5 series · 16 of 46 positions shown, 18 images · IV contrast (ISOVUE)
Comparison: Prior ultrasound from 05/20/2009.

CLINICAL DATA: Initial evaluation for acute abdominal pain with
distension.

EXAM:
CT ABDOMEN AND PELVIS WITH CONTRAST
TECHNIQUE: Multidetector CT imaging of the abdomen and pelvis was performed
using the standard protocol following bolus administration of
intravenous contrast.
CONTRAST:  100mL C89GMG-QAA IOPAMIDOL (C89GMG-QAA) INJECTION 61%

[Series 2: axial st · axial · 0.98mm/px · z∈[+1175,+1575]mm · 13 of 94 slices shown, 15 images]
[im 7/94  soft-tissue]
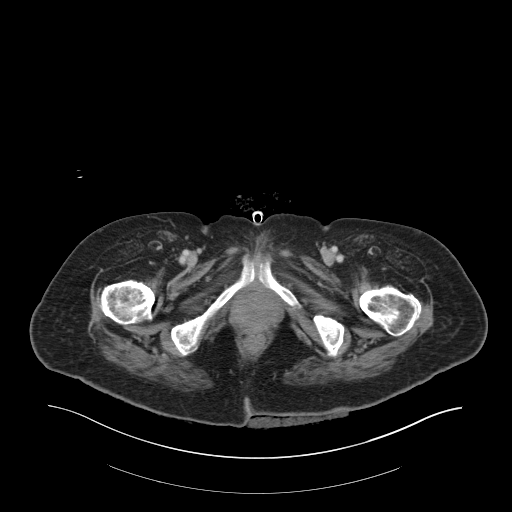
[im 7/94  bone]
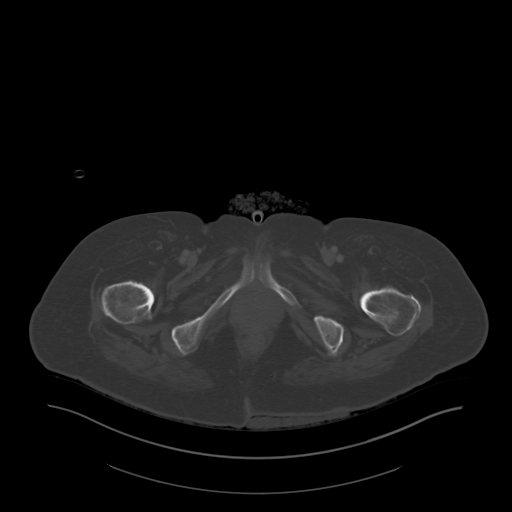
[im 13/94  soft-tissue]
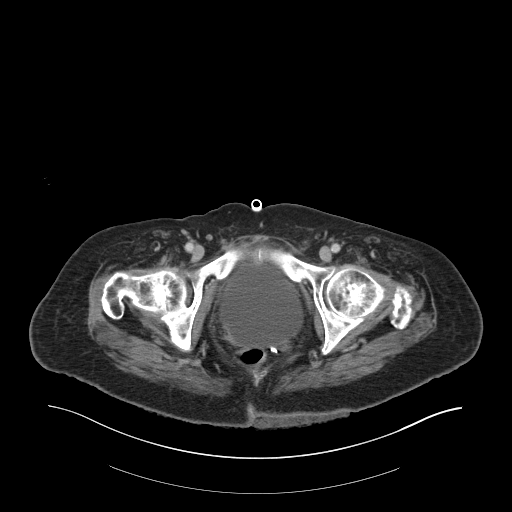
[im 19/94  soft-tissue]
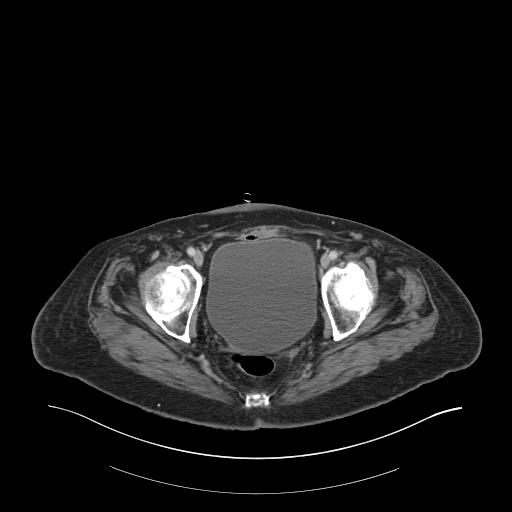
[im 25/94  soft-tissue]
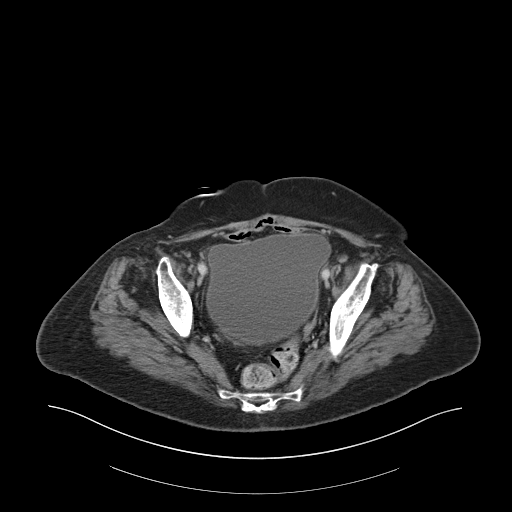
[im 32/94  soft-tissue]
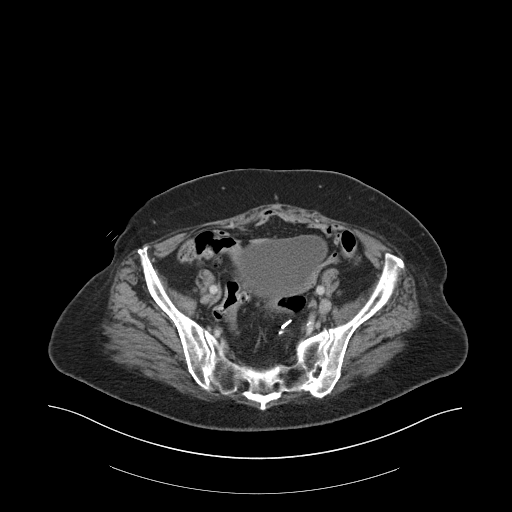
[im 38/94  soft-tissue]
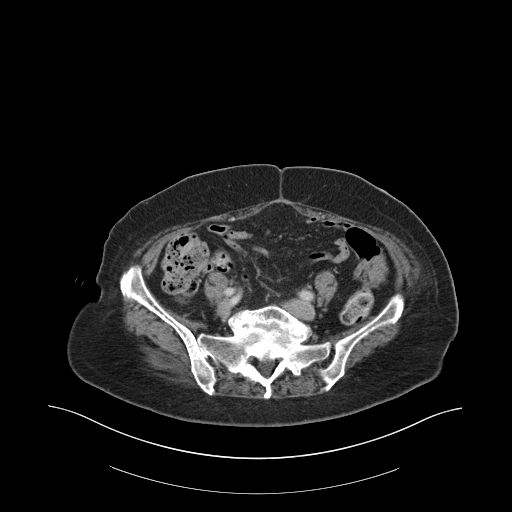
[im 50/94  soft-tissue]
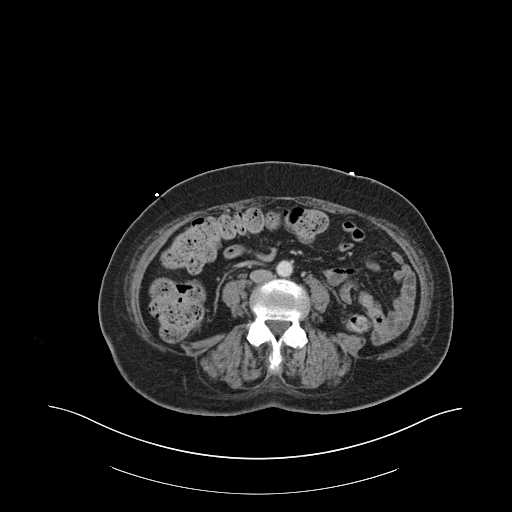
[im 56/94  soft-tissue]
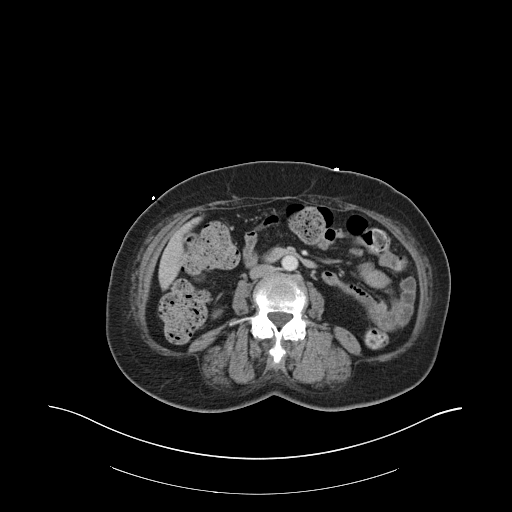
[im 63/94  soft-tissue]
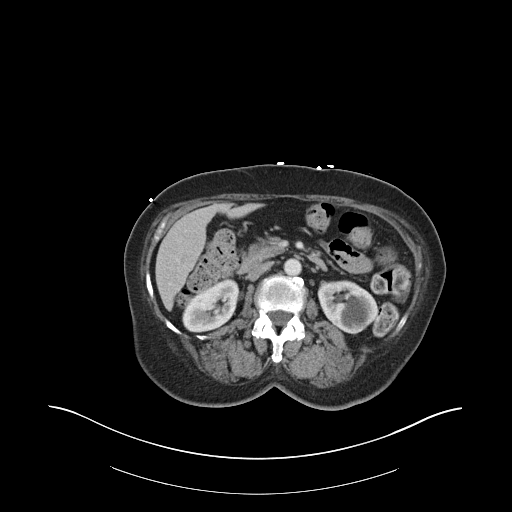
[im 63/94  bone]
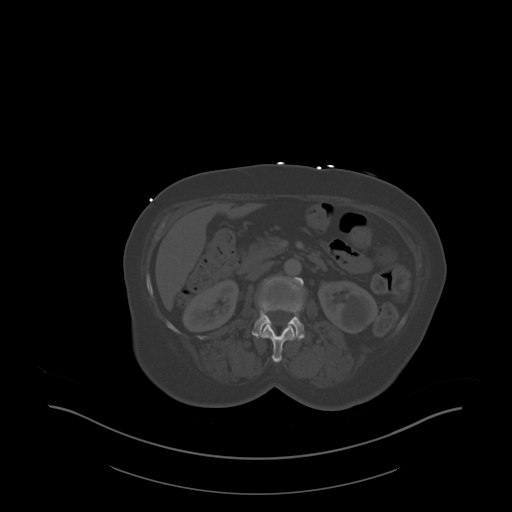
[im 69/94  soft-tissue]
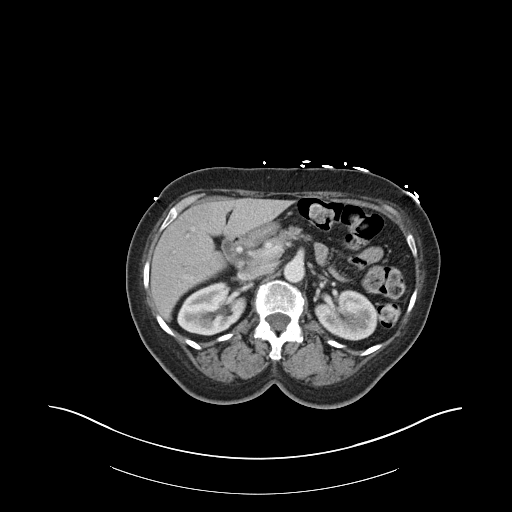
[im 75/94  soft-tissue]
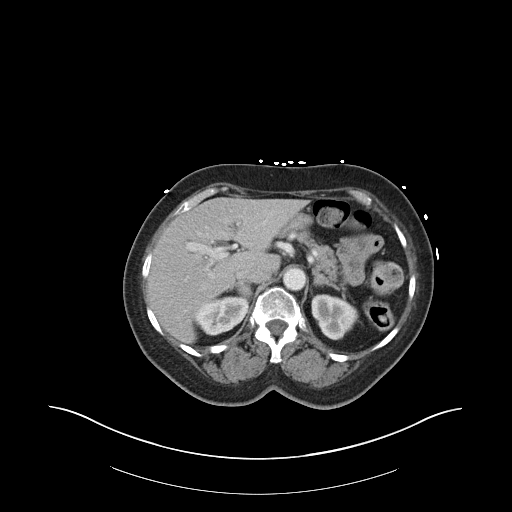
[im 81/94  soft-tissue]
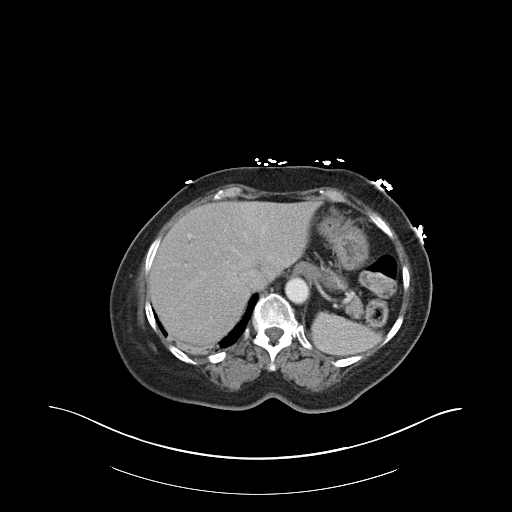
[im 87/94  soft-tissue]
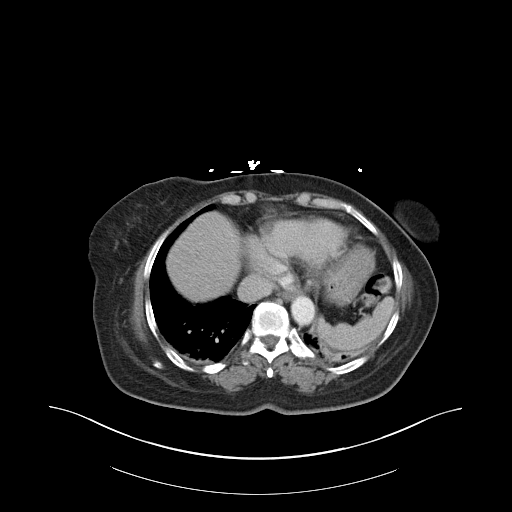

[Series 4: coronal st · coronal · 0.89mm/px · 3 of 101 slices shown]
[im 34/101  soft-tissue]
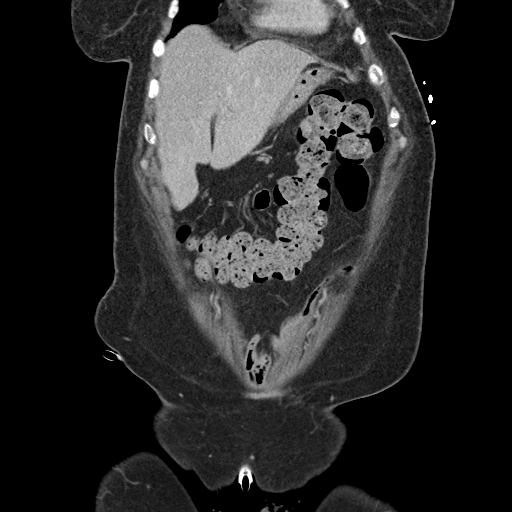
[im 45/101  soft-tissue]
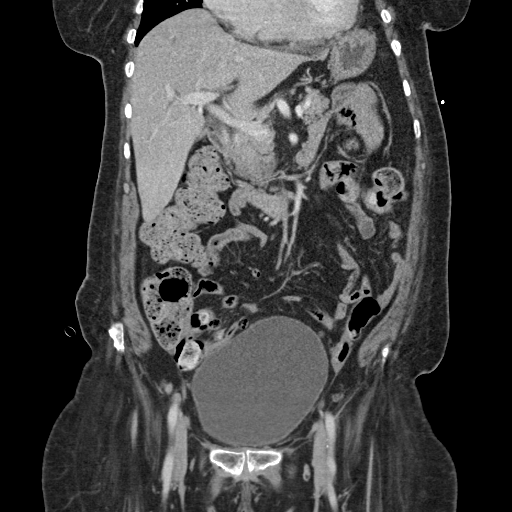
[im 56/101  soft-tissue]
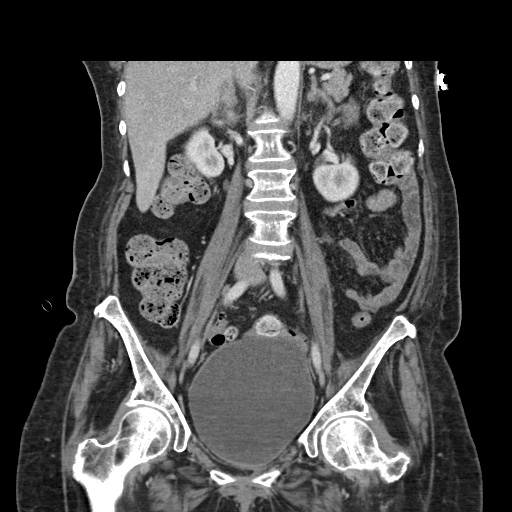

[16 of 46 positions shown; findings below may reference images not displayed]

FINDINGS: Lower chest: Trace left pleural effusion noted. Scattered
atelectatic changes seen dependently within the visualized lung
bases. 6 mm right lower lobe pulmonary nodule noted, indeterminate
(series 6, image 5).

Hepatobiliary: Liver demonstrates a normal contrast enhanced
appearance. Gallbladder surgically absent. Mild intrahepatic biliary
dilatation likely related post cholecystectomy changes. Common bile
duct of normal caliber.

Pancreas: Pancreas within normal limits.

Spleen: Spleen within normal limits.

Adrenals/Urinary Tract: 2.1 cm intermediate density nodule present
at the medial limb of the right adrenal gland, indeterminate.
Adrenal glands otherwise unremarkable. Kidneys equal in size with
symmetric enhancement. 3.8 cm simple cyst present at the lower pole
the left kidney. Few additional subcentimeter hypodensities
involving the right kidney too small the characterize, but
statistically likely reflects small cysts as well. No focal
enhancing renal mass. No nephrolithiasis or hydronephrosis. No
hydroureter. Bladder moderately distended 1.1 cm hyperdensity along
the right posterolateral bladder wall suspected to be postsurgical
in nature given provided history.

Stomach/Bowel: Stomach largely decompressed without acute
abnormality. No evidence for bowel obstruction. No abnormal wall
thickening, mucosal enhancement, or inflammatory fat stranding seen
about the bowels. Normal appendix. Moderate to large volume stool
within the colon, which could reflect constipation.

Vascular/Lymphatic: Normal intravascular enhancement seen throughout
the intra-abdominal aorta. Mild atherosclerotic change for age. No
aneurysm. Mesenteric vessels patent proximally. No adenopathy.

Reproductive: Uterus appears to be absent. Ovaries within normal
limits. No adnexal mass.

Other: No free air or fluid.

Musculoskeletal: No acute osseous abnormality. No discrete lytic or
blastic osseous lesions. Chronic bilateral pars defects at L5 with
associated 3 mm spondylolisthesis.
IMPRESSION: 1. No CT evidence for acute intra-abdominal or pelvic process.
2. Moderate to large volume stool within the colon, which could
reflect constipation.
3. Chronic bilateral pars defects at L5 with associated 3 mm
spondylolisthesis.
4. 6 mm right lower lobe pulmonary nodule, indeterminate.
Non-contrast chest CT at 6-12 months is recommended. If the nodule
is stable at time of repeat CT, then future CT at 18-24 months (from
today's scan) is considered optional for low-risk patients, but is
recommended for high-risk patients. This recommendation follows the
consensus statement: Guidelines for Management of Incidental
Pulmonary Nodules Detected on CT Images: From the [HOSPITAL]
